# Patient Record
Sex: Female | Born: 1955 | Hispanic: No | Marital: Married | Smoking: Never smoker
Health system: Southern US, Community
[De-identification: ages and names within clinical notes are randomized; demographics above are authoritative.]

## PROBLEM LIST (undated history)

## (undated) DIAGNOSIS — I10 Essential (primary) hypertension: Secondary | ICD-10-CM

## (undated) DIAGNOSIS — Z809 Family history of malignant neoplasm, unspecified: Secondary | ICD-10-CM

## (undated) DIAGNOSIS — Z923 Personal history of irradiation: Secondary | ICD-10-CM

## (undated) DIAGNOSIS — E119 Type 2 diabetes mellitus without complications: Secondary | ICD-10-CM

## (undated) DIAGNOSIS — C801 Malignant (primary) neoplasm, unspecified: Secondary | ICD-10-CM

## (undated) DIAGNOSIS — E785 Hyperlipidemia, unspecified: Secondary | ICD-10-CM

## (undated) HISTORY — PX: NO PAST SURGERIES: SHX2092

## (undated) HISTORY — DX: Family history of malignant neoplasm, unspecified: Z80.9

## (undated) HISTORY — PX: TUBAL LIGATION: SHX77

## (undated) HISTORY — DX: Hyperlipidemia, unspecified: E78.5

## (undated) HISTORY — DX: Essential (primary) hypertension: I10

## (undated) HISTORY — PX: BREAST LUMPECTOMY: SHX2

## (undated) HISTORY — DX: Type 2 diabetes mellitus without complications: E11.9

---

## 2015-05-13 ENCOUNTER — Ambulatory Visit: Payer: Self-pay

## 2015-06-04 ENCOUNTER — Ambulatory Visit: Payer: Self-pay

## 2015-07-18 ENCOUNTER — Ambulatory Visit: Payer: Medicaid Other | Attending: Family Medicine

## 2015-10-29 ENCOUNTER — Telehealth: Payer: Self-pay | Admitting: Internal Medicine

## 2015-10-29 ENCOUNTER — Ambulatory Visit: Payer: Self-pay | Attending: Internal Medicine | Admitting: Internal Medicine

## 2015-10-29 ENCOUNTER — Telehealth: Payer: Self-pay

## 2015-10-29 VITALS — BP 114/77 | HR 76 | Temp 98.0°F | Resp 16 | Wt 139.6 lb

## 2015-10-29 DIAGNOSIS — E559 Vitamin D deficiency, unspecified: Secondary | ICD-10-CM | POA: Insufficient documentation

## 2015-10-29 DIAGNOSIS — Z131 Encounter for screening for diabetes mellitus: Secondary | ICD-10-CM

## 2015-10-29 DIAGNOSIS — E785 Hyperlipidemia, unspecified: Secondary | ICD-10-CM | POA: Insufficient documentation

## 2015-10-29 DIAGNOSIS — K029 Dental caries, unspecified: Secondary | ICD-10-CM

## 2015-10-29 DIAGNOSIS — Z1239 Encounter for other screening for malignant neoplasm of breast: Secondary | ICD-10-CM

## 2015-10-29 DIAGNOSIS — Z1329 Encounter for screening for other suspected endocrine disorder: Secondary | ICD-10-CM

## 2015-10-29 DIAGNOSIS — Z23 Encounter for immunization: Secondary | ICD-10-CM

## 2015-10-29 DIAGNOSIS — R634 Abnormal weight loss: Secondary | ICD-10-CM | POA: Insufficient documentation

## 2015-10-29 DIAGNOSIS — H6123 Impacted cerumen, bilateral: Secondary | ICD-10-CM

## 2015-10-29 DIAGNOSIS — Z1211 Encounter for screening for malignant neoplasm of colon: Secondary | ICD-10-CM

## 2015-10-29 DIAGNOSIS — I10 Essential (primary) hypertension: Secondary | ICD-10-CM | POA: Insufficient documentation

## 2015-10-29 LAB — CBC WITH DIFFERENTIAL/PLATELET
BASOS PCT: 0 %
Basophils Absolute: 0 cells/uL (ref 0–200)
Eosinophils Absolute: 220 cells/uL (ref 15–500)
Eosinophils Relative: 4 %
HEMATOCRIT: 38.1 % (ref 35.0–45.0)
HEMOGLOBIN: 12.9 g/dL (ref 11.7–15.5)
LYMPHS ABS: 2255 {cells}/uL (ref 850–3900)
Lymphocytes Relative: 41 %
MCH: 28.4 pg (ref 27.0–33.0)
MCHC: 33.9 g/dL (ref 32.0–36.0)
MCV: 83.9 fL (ref 80.0–100.0)
MONO ABS: 330 {cells}/uL (ref 200–950)
MPV: 10.7 fL (ref 7.5–12.5)
Monocytes Relative: 6 %
Neutro Abs: 2695 cells/uL (ref 1500–7800)
Neutrophils Relative %: 49 %
Platelets: 237 10*3/uL (ref 140–400)
RBC: 4.54 MIL/uL (ref 3.80–5.10)
RDW: 13.2 % (ref 11.0–15.0)
WBC: 5.5 10*3/uL (ref 3.8–10.8)

## 2015-10-29 LAB — POCT GLYCOSYLATED HEMOGLOBIN (HGB A1C): HEMOGLOBIN A1C: 6

## 2015-10-29 LAB — GLUCOSE, POCT (MANUAL RESULT ENTRY): POC GLUCOSE: 104 mg/dL — AB (ref 70–99)

## 2015-10-29 LAB — TSH: TSH: 1.83 mIU/L

## 2015-10-29 MED ORDER — METFORMIN HCL 500 MG PO TABS: 500.0000 mg | ORAL_TABLET | Freq: Two times a day (BID) | ORAL | 3 refills | Status: DC

## 2015-10-29 MED ORDER — SIMVASTATIN 20 MG PO TABS: 20.0000 mg | ORAL_TABLET | Freq: Every day | ORAL | 1 refills | Status: DC

## 2015-10-29 MED ORDER — LISINOPRIL-HYDROCHLOROTHIAZIDE 20-12.5 MG PO TABS: 1.0000 | ORAL_TABLET | Freq: Every day | ORAL | 1 refills | Status: DC

## 2015-10-29 NOTE — Telephone Encounter (Signed)
Pt. Forgot to tell her PCP that she needs a referral for the dentist. Pt. Needs a cleaning. Please f/u

## 2015-10-29 NOTE — Telephone Encounter (Signed)
Done. May take 1-2 weeks to get appt at earliest.  Please tell her to keep appt, bc if miss, these dentist will not see again, since they are working with our financial assistance program.

## 2015-10-29 NOTE — Telephone Encounter (Signed)
Patient daughter is aware of the referral placed for the dentist and doesn't have any questions or concerns

## 2015-10-29 NOTE — Telephone Encounter (Signed)
Will forward to pcp

## 2015-10-29 NOTE — Telephone Encounter (Signed)
Talked to dgt (who was at clinic today) for predm, metformin 500 qday started

## 2015-10-29 NOTE — Progress Notes (Signed)
Emily Hendricks, is a 60 y.o. female  [id number]  [id number]  DOB - 1955-10-06  CC:  Chief Complaint  Patient presents with  . Establish Care       HPI: Emily Hendricks is a 60 y.o. female Hindi here today to establish medical care, new to our clinic.  Last saw pcp in [place] about 1 year ago.  Pt has significant pmhx of HTN, hld, and vit d def. She use to have knee pains, but not bad. C/o of wax in ears., uses qtips and other equipment at times to take it out. Denies hearing loss.  Last mm was at least 4-5 years ago, never had colonoscopy.  Does not smoke or drink.   Patient has No headache, No chest pain, No abdominal pain - No Nausea, No new weakness tingling or numbness, No Cough - SOB.  Pt is here w/ her adult dgt, who is helping [place] interpret.  Review of Systems: Per HPI, o/w all systems reviewed and negative.    Not on File No past medical history on file. No current outpatient prescriptions on file prior to visit.   No current facility-administered medications on file prior to visit.    No family history on file. Social History   Social History  . Marital status: Married    Spouse name: N/A  . Number of children: N/A  . Years of education: N/A   Occupational History  . Not on file.   Social History Main Topics  . Smoking status: Not on file  . Smokeless tobacco: Not on file  . Alcohol use Not on file  . Drug use: Unknown  . Sexual activity: Not on file   Other Topics Concern  . Not on file   Social History Narrative  . No narrative on file    Objective:   Vitals:   10/29/15 1140  BP: 114/77  Pulse: 76  Resp: 16  Temp: 98 F (36.7 C)    Filed Weights   10/29/15 1140  Weight: 139 lb 9.6 oz (63.3 kg)    BP Readings from Last 3 Encounters:  10/29/15 114/77    Physical Exam: Constitutional: Patient appears well-developed and well-nourished. No distress. AAOx3, pleasant. HENT: Normocephalic, atraumatic, External right and left  ear normal. Oropharynx is clear and moist. bilat ear canals w/ ceruminosis impaction, dry/soft in some areas. Eyes: Conjunctivae and EOM are normal. PERRL, no scleral icterus. Neck: Normal ROM. Neck supple. No JVD.  CVS: RRR, S1/S2 +, no murmurs, no gallops, no carotid bruit.  Pulmonary: Effort and breath sounds normal, no stridor, rhonchi, wheezes, rales.  Abdominal: Soft. BS +, no distension, tenderness, rebound or guarding.  Musculoskeletal: Normal range of motion. No edema and no tenderness.  LE: bilat/ no c/c Neuro: muscle tone coordination wnl. No cranial nerve deficit grossly. Skin: Skin is warm and dry. No rash noted. Not diaphoretic. No erythema. No pallor.  Has 27mm mole on right shoulder, been there for years per pt. Psychiatric: Normal mood and affect. Behavior, judgment, thought content normal.  No results found for: WBC, HGB, HCT, MCV, PLT No results found for: CREATININE, BUN, NA, K, CL, CO2  No results found for: HGBA1C Lipid Panel  No results found for: CHOL, TRIG, HDL, CHOLHDL, VLDL, LDLCALC     Depression screen [facility] 2/9 10/29/2015  Decreased Interest 0  Down, Depressed, Hopeless 0  PHQ - 2 Score 0    Assessment and plan:   1. HTN (hypertension), benign Low salt diet discussed,  -  CBC with Differential/Platelet - BASIC METABOLIC PANEL WITH GFR - continue prinzide 20-12.5 for now, may be able to reduce next time if remains nml bp.  2. HLD (hyperlipidemia) Ate some rice wafers this am, - Lipid Panel - continue simvastatin20  for now, pending labs  3. Ceruminosis, bilateral - Ear Lavage - per CMA, mod amt obtained. - encouraged pt to not use qtips/utensils to remove ear wax, may cause TM damage, infections, etc.  4. Colon cancer screening - Ambulatory referral to Gastroenterology, per pt has orange card  5. Vitamin D deficiency - VITAMIN D 25 Hydroxy (Vit-D Deficiency, Fractures)  6. Diabetes mellitus screening - POCT glycosylated hemoglobin (Hb A1C) -  6.0 - recd metformin 500 qday. Called dgt abt finding, she will pass on message to her mom - recd low carb diet, increase exericse - trial metformin 500 qd for weight loss and glucose control., d/w dgt s/e of gi, if too many gi s/e, can switch to long acting.  7. Thyroid disorder screen - TSH  8. Health maintenance Mm ordered as well, but may be pending insurance status.  Return in about 4 weeks (around 11/26/2015) for pap/htn.  The patient was given clear instructions to go to ER or return to medical center if symptoms don't improve, worsen or new problems develop. The patient verbalized understanding. The patient was told to call to get lab results if they haven't heard anything in the next week.    This note has been created with Surveyor, quantity. Any transcriptional errors are unintentional.   Maren Reamer, MD, [place], [place]   10/29/2015, 12:00 PM

## 2015-10-29 NOTE — Patient Instructions (Addendum)
*financial aid packet, for [employer] discount? thx    Cholesterol:  To address this please limit saturated fat to no more than 7% of your calories, limit cholesterol to 200 mg/day, increase fiber and exercise as tolerated. If needed we may add another cholesterol lowering medication to your regimen.   Low-Sodium Eating Plan Sodium raises blood pressure and causes water to be held in the body. Getting less sodium from food will help lower your blood pressure, reduce any swelling, and protect your heart, liver, and kidneys. We get sodium by adding salt (sodium chloride) to food. Most of our sodium comes from canned, boxed, and frozen foods. Restaurant foods, fast foods, and pizza are also very high in sodium. Even if you take medicine to lower your blood pressure or to reduce fluid in your body, getting less sodium from your food is important. WHAT IS MY PLAN? Most people should limit their sodium intake to 2,300 mg a day. Your health care provider recommends that you limit your sodium intake to 2 gram a day.  WHAT DO I NEED TO KNOW ABOUT THIS EATING PLAN? For the low-sodium eating plan, you will follow these general guidelines:  Choose foods with a % Daily Value for sodium of less than 5% (as listed on the food label).   Use salt-free seasonings or herbs instead of table salt or sea salt.   Check with your health care provider or pharmacist before using salt substitutes.   Eat fresh foods.  Eat more vegetables and fruits.  Limit canned vegetables. If you do use them, rinse them well to decrease the sodium.   Limit cheese to 1 oz (28 g) per day.   Eat lower-sodium products, often labeled as "lower sodium" or "no salt added."  Avoid foods that contain monosodium glutamate (MSG). MSG is sometimes added to [place] food and some canned foods.  Check food labels (Nutrition Facts labels) on foods to learn how much sodium is in one serving.  Eat more home-cooked food and less restaurant,  buffet, and fast food.  When eating at a restaurant, ask that your food be prepared with less salt, or no salt if possible.  HOW DO I READ FOOD LABELS FOR SODIUM INFORMATION? The Nutrition Facts label lists the amount of sodium in one serving of the food. If you eat more than one serving, you must multiply the listed amount of sodium by the number of servings. Food labels may also identify foods as:  Sodium free--Less than 5 mg in a serving.  Very low sodium--35 mg or less in a serving.  Low sodium--140 mg or less in a serving.  Light in sodium--50% less sodium in a serving. For example, if a food that usually has 300 mg of sodium is changed to become light in sodium, it will have 150 mg of sodium.  Reduced sodium--25% less sodium in a serving. For example, if a food that usually has 400 mg of sodium is changed to reduced sodium, it will have 300 mg of sodium. WHAT FOODS CAN I EAT? Grains Low-sodium cereals, including oats, puffed wheat and rice, and shredded wheat cereals. Low-sodium crackers. Unsalted rice and pasta. Lower-sodium bread.  Vegetables Frozen or fresh vegetables. Low-sodium or reduced-sodium canned vegetables. Low-sodium or reduced-sodium tomato sauce and paste. Low-sodium or reduced-sodium tomato and vegetable juices.  Fruits Fresh, frozen, and canned fruit. Fruit juice.  Meat and Other Protein Products Low-sodium canned tuna and salmon. Fresh or frozen meat, poultry, seafood, and fish. Lamb. Unsalted nuts.  Dried beans, peas, and lentils without added salt. Unsalted canned beans. Homemade soups without salt. Eggs.  Dairy Milk. Soy milk. Ricotta cheese. Low-sodium or reduced-sodium cheeses. Yogurt.  Condiments Fresh and dried herbs and spices. Salt-free seasonings. Onion and garlic powders. Low-sodium varieties of mustard and ketchup. Fresh or refrigerated horseradish. Lemon juice.  Fats and Oils Reduced-sodium salad dressings. Unsalted butter.   Other Unsalted popcorn and pretzels.  The items listed above may not be a complete list of recommended foods or beverages. Contact your dietitian for more options. WHAT FOODS ARE NOT RECOMMENDED? Grains Instant hot cereals. Bread stuffing, pancake, and biscuit mixes. Croutons. Seasoned rice or pasta mixes. Noodle soup cups. Boxed or frozen macaroni and cheese. Self-rising flour. Regular salted crackers. Vegetables Regular canned vegetables. Regular canned tomato sauce and paste. Regular tomato and vegetable juices. Frozen vegetables in sauces. Salted [place] fries. Olives. Angie Fava. Relishes. Sauerkraut. Salsa. Meat and Other Protein Products Salted, canned, smoked, spiced, or pickled meats, seafood, or fish. Bacon, ham, sausage, hot dogs, corned beef, chipped beef, and packaged luncheon meats. Salt pork. Jerky. Pickled herring. Anchovies, regular canned tuna, and sardines. Salted nuts. Dairy Processed cheese and cheese spreads. Cheese curds. Blue cheese and cottage cheese. Buttermilk.  Condiments Onion and garlic salt, seasoned salt, table salt, and sea salt. Canned and packaged gravies. Worcestershire sauce. Tartar sauce. Barbecue sauce. Teriyaki sauce. Soy sauce, including reduced sodium. Steak sauce. Fish sauce. Oyster sauce. Cocktail sauce. Horseradish that you find on the shelf. Regular ketchup and mustard. Meat flavorings and tenderizers. Bouillon cubes. Hot sauce. Tabasco sauce. Marinades. Taco seasonings. Relishes. Fats and Oils Regular salad dressings. Salted butter. Margarine. Ghee. Bacon fat.  Other Potato and tortilla chips. Corn chips and puffs. Salted popcorn and pretzels. Canned or dried soups. Pizza. Frozen entrees and pot pies.  The items listed above may not be a complete list of foods and beverages to avoid. Contact your dietitian for more information.   This information is not intended to replace advice given to you by your health care provider. Make sure you  discuss any questions you have with your health care provider.   Document Released: 08/28/2001 Document Revised: 03/29/2014 Document Reviewed: 01/10/2013 Elsevier Interactive Patient Education 2016 [place] is a normal process in which your reproductive ability comes to an end. This process happens gradually over a span of months to years, usually between the ages of 57 and 62. Menopause is complete when you have missed 12 consecutive menstrual periods. It is important to talk with your health care provider about some of the most common conditions that affect postmenopausal women, such as heart disease, cancer, and bone loss (osteoporosis). Adopting a healthy lifestyle and getting preventive care can help to promote your health and wellness. Those actions can also lower your chances of developing some of these common conditions. WHAT SHOULD I KNOW ABOUT MENOPAUSE? During menopause, you may experience a number of symptoms, such as:  Moderate-to-severe hot flashes.  Night sweats.  Decrease in sex drive.  Mood swings.  Headaches.  Tiredness.  Irritability.  Memory problems.  Insomnia. Choosing to treat or not to treat menopausal changes is an individual decision that you make with your health care provider. WHAT SHOULD I KNOW ABOUT HORMONE REPLACEMENT THERAPY AND SUPPLEMENTS? Hormone therapy products are effective for treating symptoms that are associated with menopause, such as hot flashes and night sweats. Hormone replacement carries certain risks, especially as you become older. If you are thinking about using estrogen or estrogen  with progestin treatments, discuss the benefits and risks with your health care provider. WHAT SHOULD I KNOW ABOUT HEART DISEASE AND STROKE? Heart disease, heart attack, and stroke become more likely as you age. This may be due, in part, to the hormonal changes that your body experiences during menopause. These can affect how your body  processes dietary fats, triglycerides, and cholesterol. Heart attack and stroke are both medical emergencies. There are many things that you can do to help prevent heart disease and stroke:  Have your blood pressure checked at least every 1-2 years. High blood pressure causes heart disease and increases the risk of stroke.  If you are 24-54 years old, ask your health care provider if you should take aspirin to prevent a heart attack or a stroke.  Do not use any tobacco products, including cigarettes, chewing tobacco, or electronic cigarettes. If you need help quitting, ask your health care provider.  It is important to eat a healthy diet and maintain a healthy weight.  Be sure to include plenty of vegetables, fruits, low-fat dairy products, and lean protein.  Avoid eating foods that are high in solid fats, added sugars, or salt (sodium).  Get regular exercise. This is one of the most important things that you can do for your health.  Try to exercise for at least 150 minutes each week. The type of exercise that you do should increase your heart rate and make you sweat. This is known as moderate-intensity exercise.  Try to do strengthening exercises at least twice each week. Do these in addition to the moderate-intensity exercise.  Know your numbers.Ask your health care provider to check your cholesterol and your blood glucose. Continue to have your blood tested as directed by your health care provider. WHAT SHOULD I KNOW ABOUT CANCER SCREENING? There are several types of cancer. Take the following steps to reduce your risk and to catch any cancer development as early as possible. Breast Cancer  Practice breast self-awareness.  This means understanding how your breasts normally appear and feel.  It also means doing regular breast self-exams. Let your health care provider know about any changes, no matter how small.  If you are 58 or older, have a clinician do a breast exam (clinical  breast exam or CBE) every year. Depending on your age, family history, and medical history, it may be recommended that you also have a yearly breast X-ray (mammogram).  If you have a family history of breast cancer, talk with your health care provider about genetic screening.  If you are at high risk for breast cancer, talk with your health care provider about having an MRI and a mammogram every year.  Breast cancer (BRCA) gene test is recommended for women who have family members with BRCA-related cancers. Results of the assessment will determine the need for genetic counseling and BRCA1 and for BRCA2 testing. BRCA-related cancers include these types:  Breast. This occurs in males or females.  Ovarian.  Tubal. This may also be called fallopian tube cancer.  Cancer of the abdominal or pelvic lining (peritoneal cancer).  Prostate.  Pancreatic. Cervical, Uterine, and Ovarian Cancer Your health care provider may recommend that you be screened regularly for cancer of the pelvic organs. These include your ovaries, uterus, and vagina. This screening involves a pelvic exam, which includes checking for microscopic changes to the surface of your cervix (Pap test).  For women ages 21-65, health care providers may recommend a pelvic exam and a Pap test every three  years. For women ages 24-65, they may recommend the Pap test and pelvic exam, combined with testing for human papilloma virus (HPV), every five years. Some types of HPV increase your risk of cervical cancer. Testing for HPV may also be done on women of any age who have unclear Pap test results.  Other health care providers may not recommend any screening for nonpregnant women who are considered low risk for pelvic cancer and have no symptoms. Ask your health care provider if a screening pelvic exam is right for you.  If you have had past treatment for cervical cancer or a condition that could lead to cancer, you need Pap tests and screening  for cancer for at least 20 years after your treatment. If Pap tests have been discontinued for you, your risk factors (such as having a new sexual partner) need to be reassessed to determine if you should start having screenings again. Some women have medical problems that increase the chance of getting cervical cancer. In these cases, your health care provider may recommend that you have screening and Pap tests more often.  If you have a family history of uterine cancer or ovarian cancer, talk with your health care provider about genetic screening.  If you have vaginal bleeding after reaching menopause, tell your health care provider.  There are currently no reliable tests available to screen for ovarian cancer. Lung Cancer Lung cancer screening is recommended for adults 23-30 years old who are at high risk for lung cancer because of a history of smoking. A yearly low-dose CT scan of the lungs is recommended if you:  Currently smoke.  Have a history of at least 30 pack-years of smoking and you currently smoke or have quit within the past 15 years. A pack-year is smoking an average of one pack of cigarettes per day for one year. Yearly screening should:  Continue until it has been 15 years since you quit.  Stop if you develop a health problem that would prevent you from having lung cancer treatment. Colorectal Cancer  This type of cancer can be detected and can often be prevented.  Routine colorectal cancer screening usually begins at age 73 and continues through age 14.  If you have risk factors for colon cancer, your health care provider may recommend that you be screened at an earlier age.  If you have a family history of colorectal cancer, talk with your health care provider about genetic screening.  Your health care provider may also recommend using home test kits to check for hidden blood in your stool.  A small camera at the end of a tube can be used to examine your colon  directly (sigmoidoscopy or colonoscopy). This is done to check for the earliest forms of colorectal cancer.  Direct examination of the colon should be repeated every 5-10 years until age 58. However, if early forms of precancerous polyps or small growths are found or if you have a family history or genetic risk for colorectal cancer, you may need to be screened more often. Skin Cancer  Check your skin from head to toe regularly.  Monitor any moles. Be sure to tell your health care provider:  About any new moles or changes in moles, especially if there is a change in a mole's shape or color.  If you have a mole that is larger than the size of a pencil eraser.  If any of your family members has a history of skin cancer, especially at a young  age, talk with your health care provider about genetic screening.  Always use sunscreen. Apply sunscreen liberally and repeatedly throughout the day.  Whenever you are outside, protect yourself by wearing long sleeves, pants, a wide-brimmed hat, and sunglasses. WHAT SHOULD I KNOW ABOUT OSTEOPOROSIS? Osteoporosis is a condition in which bone destruction happens more quickly than new bone creation. After menopause, you may be at an increased risk for osteoporosis. To help prevent osteoporosis or the bone fractures that can happen because of osteoporosis, the following is recommended:  If you are 77-85 years old, get at least 1,000 mg of calcium and at least 600 mg of vitamin D per day.  If you are older than age 29 but younger than age 40, get at least 1,200 mg of calcium and at least 600 mg of vitamin D per day.  If you are older than age 77, get at least 1,200 mg of calcium and at least 800 mg of vitamin D per day. Smoking and excessive alcohol intake increase the risk of osteoporosis. Eat foods that are rich in calcium and vitamin D, and do weight-bearing exercises several times each week as directed by your health care provider. WHAT SHOULD I KNOW  ABOUT HOW MENOPAUSE AFFECTS [place]? Depression may occur at any age, but it is more common as you become older. Common symptoms of depression include:  Low or sad mood.  Changes in sleep patterns.  Changes in appetite or eating patterns.  Feeling an overall lack of motivation or enjoyment of activities that you previously enjoyed.  Frequent crying spells. Talk with your health care provider if you think that you are experiencing depression. WHAT SHOULD I KNOW ABOUT IMMUNIZATIONS? It is important that you get and maintain your immunizations. These include:  Tetanus, diphtheria, and pertussis (Tdap) booster vaccine.  Influenza every year before the flu season begins.  Pneumonia vaccine.  Shingles vaccine. Your health care provider may also recommend other immunizations.   This information is not intended to replace advice given to you by your health care provider. Make sure you discuss any questions you have with your health care provider.   Document Released: 04/30/2005 Document Revised: 03/29/2014 Document Reviewed: 11/08/2013 Elsevier Interactive Patient Education 2016 [employer].    Tdap Vaccine (Tetanus, Diphtheria and Pertussis): What You Need to Know 1. Why get vaccinated? Tetanus, diphtheria and pertussis are very serious diseases. Tdap vaccine can protect [place] from these diseases. And, Tdap vaccine given to pregnant women can protect newborn babies against pertussis. TETANUS (Lockjaw) is rare in the [place] today. It causes painful muscle tightening and stiffness, usually all over the body.  It can lead to tightening of muscles in the head and neck so you can't open your mouth, swallow, or sometimes even breathe. Tetanus kills about 1 out of 10 people who are infected even after receiving the best medical care. DIPHTHERIA is also rare in the [place] today. It can cause a thick coating to form in the back of the throat.  It can lead to breathing  problems, heart failure, paralysis, and death. PERTUSSIS (Whooping Cough) causes severe coughing spells, which can cause difficulty breathing, vomiting and disturbed sleep.  It can also lead to weight loss, incontinence, and rib fractures. Up to 2 in 100 adolescents and 5 in 100 adults with pertussis are hospitalized or have complications, which could include pneumonia or death. These diseases are caused by bacteria. Diphtheria and pertussis are spread from person to person through secretions from  coughing or sneezing. Tetanus enters the body through cuts, scratches, or wounds. Before vaccines, as many as 200,000 cases of diphtheria, 200,000 cases of pertussis, and hundreds of cases of tetanus, were reported in the [place] each year. Since vaccination began, reports of cases for tetanus and diphtheria have dropped by about 99% and for pertussis by about 80%. 2. Tdap vaccine Tdap vaccine can protect adolescents and adults from tetanus, diphtheria, and pertussis. One dose of Tdap is routinely given at age 46 or 42. People who did not get Tdap at that age should get it as soon as possible. Tdap is especially important for healthcare professionals and anyone having close contact with a baby younger than 12 months. Pregnant women should get a dose of Tdap during every pregnancy, to protect the newborn from pertussis. Infants are most at risk for severe, life-threatening complications from pertussis. Another vaccine, called Td, protects against tetanus and diphtheria, but not pertussis. A Td booster should be given every 10 years. Tdap may be given as one of these boosters if you have never gotten Tdap before. Tdap may also be given after a severe cut or burn to prevent tetanus infection. Your doctor or the person giving you the vaccine can give you more information. Tdap may safely be given at the same time as other vaccines. 3. Some people should not get this vaccine  A person who has ever had a  life-threatening allergic reaction after a previous dose of any diphtheria, tetanus or pertussis containing vaccine, OR has a severe allergy to any part of this vaccine, should not get Tdap vaccine. Tell the person giving the vaccine about any severe allergies.  Anyone who had coma or long repeated seizures within 7 days after a childhood dose of DTP or DTaP, or a previous dose of Tdap, should not get Tdap, unless a cause other than the vaccine was found. They can still get Td.  Talk to your doctor if you:  have seizures or another nervous system problem,  had severe pain or swelling after any vaccine containing diphtheria, tetanus or pertussis,  ever had a condition called Guillain-Barr Syndrome (GBS),  aren't feeling well on the day the shot is scheduled. 4. Risks With any medicine, including vaccines, there is a chance of side effects. These are usually mild and go away on their own. Serious reactions are also possible but are rare. Most people who get Tdap vaccine do not have any problems with it. Mild problems following Tdap (Did not interfere with activities)  Pain where the shot was given (about 3 in 4 adolescents or 2 in 3 adults)  Redness or swelling where the shot was given (about 1 person in 5)  Mild fever of at least 100.70F (up to about 1 in 25 adolescents or 1 in 100 adults)  Headache (about 3 or 4 people in 10)  Tiredness (about 1 person in 3 or 4)  Nausea, vomiting, diarrhea, stomach ache (up to 1 in 4 adolescents or 1 in 10 adults)  Chills, sore joints (about 1 person in 10)  Body aches (about 1 person in 3 or 4)  Rash, swollen glands (uncommon) Moderate problems following Tdap (Interfered with activities, but did not require medical attention)  Pain where the shot was given (up to 1 in 5 or 6)  Redness or swelling where the shot was given (up to about 1 in 16 adolescents or 1 in 12 adults)  Fever over 102F (about 1 in 100 adolescents or  1 in 250  adults)  Headache (about 1 in 7 adolescents or 1 in 10 adults)  Nausea, vomiting, diarrhea, stomach ache (up to 1 or 3 people in 100)  Swelling of the entire arm where the shot was given (up to about 1 in 500). Severe problems following Tdap (Unable to perform usual activities; required medical attention)  Swelling, severe pain, bleeding and redness in the arm where the shot was given (rare). Problems that could happen after any vaccine:  People sometimes faint after a medical procedure, including vaccination. Sitting or lying down for about 15 minutes can help prevent fainting, and injuries caused by a fall. Tell your doctor if you feel dizzy, or have vision changes or ringing in the ears.  Some people get severe pain in the shoulder and have difficulty moving the arm where a shot was given. This happens very rarely.  Any medication can cause a severe allergic reaction. Such reactions from a vaccine are very rare, estimated at fewer than 1 in a million doses, and would happen within a few minutes to a few hours after the vaccination. As with any medicine, there is a very remote chance of a vaccine causing a serious injury or death. The safety of vaccines is always being monitored. For more information, visit: http://www.aguilar.org/ 5. What if there is a serious problem? What should I look for?  Look for anything that concerns you, such as signs of a severe allergic reaction, very high fever, or unusual behavior.  Signs of a severe allergic reaction can include hives, swelling of the face and throat, difficulty breathing, a fast heartbeat, dizziness, and weakness. These would usually start a few minutes to a few hours after the vaccination. What should I do?  If you think it is a severe allergic reaction or other emergency that can't wait, call 9-1-1 or get the person to the nearest hospital. Otherwise, call your doctor.  Afterward, the reaction should be reported to the Vaccine  Adverse Event Reporting System (VAERS). Your doctor might file this report, or you can do it yourself through the VAERS web site at www.vaers.SamedayNews.es, or by calling 463-200-5956. VAERS does not give medical advice.  6. The National Vaccine Injury Compensation Program The [employer] Vaccine Injury Compensation Program (VICP) is a federal program that was created to compensate people who may have been injured by certain vaccines. Persons who believe they may have been injured by a vaccine can learn about the program and about filing a claim by calling (562) 610-5504 or visiting the [place] website at GoldCloset.com.ee. There is a time limit to file a claim for compensation. 7. How can I learn more?  Ask your doctor. He or she can give you the vaccine package insert or suggest other sources of information.  Call your local or state health department.  Contact the Centers for Disease Control and Prevention (CDC):  Call 219 322 3254 (1-800-CDC-INFO) or  Visit CDC's website at [email or web address]/ CDC Tdap Vaccine VIS (05/15/13)   This information is not intended to replace advice given to you by your health care provider. Make sure you discuss any questions you have with your health care provider.   Document Released: 09/07/2011 Document Revised: 03/29/2014 Document Reviewed: 06/20/2013 Elsevier Interactive Patient Education [employer].

## 2015-10-29 NOTE — Telephone Encounter (Signed)
Spoke wit h pt daughter and made aware about the dentist referral and it will take anywhere between 1-2 weeks

## 2015-10-30 LAB — LIPID PANEL
CHOL/HDL RATIO: 3.2 ratio (ref <=5.0)
Cholesterol: 161 mg/dL (ref 125–200)
HDL: 51 mg/dL (ref >=46)
LDL CALC: 81 mg/dL (ref <130)
Triglycerides: 144 mg/dL (ref <150)
VLDL: 29 mg/dL (ref <30)

## 2015-10-30 LAB — BASIC METABOLIC PANEL WITH GFR
BUN: 18 mg/dL (ref 7–25)
CO2: 28 mmol/L (ref 20–31)
Calcium: 9.5 mg/dL (ref 8.6–10.4)
Chloride: 102 mmol/L (ref 98–110)
Creat: 0.79 mg/dL (ref 0.50–0.99)
GFR, EST NON AFRICAN AMERICAN: 81 mL/min (ref >=60)
GLUCOSE: 100 mg/dL — AB (ref 65–99)
POTASSIUM: 4.2 mmol/L (ref 3.5–5.3)
Sodium: 140 mmol/L (ref 135–146)

## 2015-10-30 LAB — VITAMIN D 25 HYDROXY (VIT D DEFICIENCY, FRACTURES): Vit D, 25-Hydroxy: 25 ng/mL — ABNORMAL LOW (ref 30–100)

## 2015-10-31 ENCOUNTER — Encounter: Payer: Self-pay | Admitting: Gastroenterology

## 2015-11-05 ENCOUNTER — Telehealth: Payer: Self-pay

## 2015-11-05 NOTE — Telephone Encounter (Signed)
[facility] [id number] contacted pt to go over lab results pt did not answer lvm for pt to give me a call back at her earliest convenience

## 2015-11-12 ENCOUNTER — Telehealth: Payer: Self-pay

## 2015-11-12 NOTE — Telephone Encounter (Signed)
Contacted pt to go over lab results spoke with [name] which is the daughter she is aware of results and is aware to add Vit D 1000 iu daily. Emily Hendricks states she doesn't have any questions or concerns

## 2016-01-02 ENCOUNTER — Encounter: Payer: Self-pay | Admitting: Gastroenterology

## 2016-01-28 ENCOUNTER — Other Ambulatory Visit: Payer: Self-pay | Admitting: Internal Medicine

## 2016-02-03 ENCOUNTER — Ambulatory Visit (HOSPITAL_COMMUNITY)
Admission: RE | Admit: 2016-02-03 | Discharge: 2016-02-03 | Disposition: A | Discharge status: Home / Self Care | Payer: Self-pay | Source: Ambulatory Visit | Attending: Internal Medicine | Admitting: Internal Medicine

## 2016-02-03 ENCOUNTER — Ambulatory Visit: Payer: Self-pay | Attending: Internal Medicine | Admitting: Internal Medicine

## 2016-02-03 VITALS — BP 113/78 | HR 85 | Temp 97.3°F | Resp 16 | Wt 141.4 lb

## 2016-02-03 DIAGNOSIS — R7303 Prediabetes: Secondary | ICD-10-CM | POA: Insufficient documentation

## 2016-02-03 DIAGNOSIS — R059 Cough, unspecified: Secondary | ICD-10-CM

## 2016-02-03 DIAGNOSIS — Z79899 Other long term (current) drug therapy: Secondary | ICD-10-CM | POA: Insufficient documentation

## 2016-02-03 DIAGNOSIS — R05 Cough: Secondary | ICD-10-CM | POA: Insufficient documentation

## 2016-02-03 DIAGNOSIS — M778 Other enthesopathies, not elsewhere classified: Secondary | ICD-10-CM

## 2016-02-03 DIAGNOSIS — I700 Atherosclerosis of aorta: Secondary | ICD-10-CM | POA: Insufficient documentation

## 2016-02-03 DIAGNOSIS — M654 Radial styloid tenosynovitis [de Quervain]: Secondary | ICD-10-CM | POA: Insufficient documentation

## 2016-02-03 DIAGNOSIS — Z7984 Long term (current) use of oral hypoglycemic drugs: Secondary | ICD-10-CM | POA: Insufficient documentation

## 2016-02-03 DIAGNOSIS — I10 Essential (primary) hypertension: Secondary | ICD-10-CM | POA: Insufficient documentation

## 2016-02-03 LAB — GLUCOSE, POCT (MANUAL RESULT ENTRY): POC Glucose: 125 mg/dl — AB (ref 70–99)

## 2016-02-03 MED ORDER — BENZONATATE 100 MG PO CAPS: 100.0000 mg | ORAL_CAPSULE | Freq: Three times a day (TID) | ORAL | 0 refills | Status: DC | PRN

## 2016-02-03 MED ORDER — METFORMIN HCL 500 MG PO TABS: 500.0000 mg | ORAL_TABLET | Freq: Every day | ORAL | 3 refills | Status: DC

## 2016-02-03 MED ORDER — PREDNISONE 20 MG PO TABS: 40.0000 mg | ORAL_TABLET | Freq: Every day | ORAL | 0 refills | Status: DC

## 2016-02-03 MED ORDER — DICLOFENAC SODIUM 1 % TD GEL: 2.0000 g | Freq: Four times a day (QID) | TRANSDERMAL | 2 refills | Status: DC

## 2016-02-03 NOTE — Progress Notes (Signed)
Emily Hendricks, is a 60 y.o. female  [id number]  [id number]  DOB - Jul 14, 1955  Chief Complaint  Patient presents with  . Arm Pain        Subjective:   Emily Hendricks is a 60 y.o. female here today for a follow up visit. Pt has been co of right sided pain over her right thumb for 1-2 wks. Comes and goes, but now does not seem to go away.  Also co of bilat knee pains occasionally when sits for long periods and than gets up.  Of note, pt has also been c/o nonproductive dry cough last 2- 3wks, only at night when sleeping. She has been on the lisinopril for  A log time. Sleeps w/ 1 pillow, denies pnd/f/c/productive cough.  tol metformin, but only takes it once a day currently.  Patient has No headache, No chest pain, No abdominal pain - No Nausea, No new weakness tingling or numbness, SOB.   She is here w/ her dgt who is interpreting.  No problems updated.  ALLERGIES: Not on File  PAST MEDICAL HISTORY: No past medical history on file.  MEDICATIONS AT HOME: Prior to Admission medications   Medication Sig Start Date End Date Taking? Authorizing Provider  benzonatate (TESSALON PERLES) 100 MG capsule Take 1 capsule (100 mg total) by mouth 3 (three) times daily as needed for cough. 02/03/16   Maren Reamer, MD  diclofenac sodium (VOLTAREN) 1 % GEL Apply 2 g topically 4 (four) times daily. 02/03/16   Maren Reamer, MD  lisinopril-hydrochlorothiazide (PRINZIDE,ZESTORETIC) 20-12.5 MG tablet Take 1 tablet by mouth daily. 10/29/15   Maren Reamer, MD  metFORMIN (GLUCOPHAGE) 500 MG tablet Take 1 tablet (500 mg total) by mouth 2 (two) times daily with a meal. 10/29/15   Maren Reamer, MD  predniSONE (DELTASONE) 20 MG tablet Take 2 tablets (40 mg total) by mouth daily with breakfast. Day 1- 3 take 2 tabs (=40mg ) qam, day 4-7 take 1 tab (=20mg  ) qam, than stop. 02/03/16   Maren Reamer, MD  simvastatin (ZOCOR) 20 MG tablet TAKE 1 TABLET BY MOUTH DAILY. 01/28/16   Maren Reamer, MD     Objective:   Vitals:   02/03/16 1554  BP: 113/78  Pulse: 85  Resp: 16  Temp: 97.3 F (36.3 C)  TempSrc: Oral  SpO2: 97%  Weight: 141 lb 6.4 oz (64.1 kg)    Exam General appearance : Awake, alert, not in any distress. Speech Clear. Not toxic looking, pleasant. HEENT: Atraumatic and Normocephalic, pupils equally reactive to light. Neck: supple, no JVD.  Chest:Good air entry bilaterally, no added sounds.  No w/c/r CVS: S1 S2 regular, no murmurs/gallups or rubs. Abdomen: Bowel sounds active, Non tender and not distended with no gaurding, rigidity or rebound. Extremities: B/L Lower Ext shows no edema, both legs are warm to touch Right hand + [place]ck[place]in test w/ ttp.  Neg Phalen test.  ttp at base of right thumb. Neurology: Awake alert, and oriented X 3, CN II-XII grossly intact, Non focal Skin:No Rash  Data Review Lab Results  Component Value Date   HGBA1C 6.0 10/29/2015    Depression screen [facility] 2/9 02/03/2016 10/29/2015  Decreased Interest 0 0  Hendricks, Depressed, Hopeless 0 0  PHQ - 2 Score 0 0      Assessment & Plan   1. Tenosynovitis, de Quervain, right - otc wrist splint recd for right hand - short course steroids - prn voltaren gel  2.  Cough Etiology unclear, cardiac? Does not appear to be othopneac.  Has been on ACEI for long time as well. - DG Chest 2 View; Future - Brain natriuretic peptide, ro chf., appears euvolemic. - CBC with Differential - BASIC METABOLIC PANEL WITH GFR - tessalon pearls prn.  4. Prediabetes - metformin 500 qd ok, continue low carb diet. - POCT glucose (manual entry)  5. HTN (hypertension), benign Controlled. May need to switch off ACEI if cough is due to this.     Patient have been counseled extensively about nutrition and exercise  Return in about 3 months (around 05/05/2016), or if symptoms worsen or fail to improve.  The patient was given clear instructions to go to ER or return to medical center if  symptoms don't improve, worsen or new problems develop. The patient verbalized understanding. The patient was told to call to get lab results if they haven't heard anything in the next week.   This note has been created with Surveyor, quantity. Any transcriptional errors are unintentional.   Maren Reamer, MD, [place] [facility] [place], [place]   02/03/2016, 4:09 PM

## 2016-02-03 NOTE — Progress Notes (Signed)
Pt is in the office today for arm pain Pt states her pain is coming from her right thumb pain Pt stated the pain comes and goes Pt states when she goes to sleep at night she has a cough but during the day she doesn't Pt states when sits for long period of time her left knee starts to hurt

## 2016-02-03 NOTE — Patient Instructions (Addendum)
Calcium 1200mg  day and Vit D 5,000 IUs   -  Wrist splint. - for right hand / for Tendonitis, over the counter in pharmacy area.  -would recommend make appointment again for colonoscopy as well! Good colon cancer screening test.  -please get Mammogram done!  Breast cancer screening test.   Alfonse Ras Disease Introduction Alfonse Ras disease is inflammation of the tendon on the thumb side of the wrist. Tendons are cords of tissue that connect bones to muscles. The tendons in your hand pass through a tunnel, or sheath. A slippery layer of tissue (synovium) lets the tendons move smoothly in the sheath. With de Quervain disease, the sheath swells or thickens, causing friction and pain. The condition is also called de Quervain tendinosis and de Quervain syndrome. It occurs most often in women who are 15-44 years old. What are the causes? The exact cause of de Quervain disease is not known. It may result from:  Overusing your hands, especially with repetitive motions that involve twisting your hand or using a forceful grip.  Pregnancy.  Rheumatoid disease. What increases the risk? You may have a greater risk for de Quervain disease if you:  Are a middle-aged woman.  Are pregnant.  Have rheumatoid arthritis.  Have diabetes.  Use your hands far more than normal, especially with a tight grip or excessive twisting. What are the signs or symptoms? Pain on the thumb side of your wrist is the main symptom of de Quervain disease. Other signs and symptoms include:  Pain that gets worse when you grasp something or turn your wrist.  Pain that extends up the forearm.  Cysts in the area of the pain.  Swelling of your wrist and hand.  A sensation of snapping in the wrist.  Trouble moving the thumb and wrist. How is this diagnosed? Your health care provider may diagnose de Quervain disease based on your signs and symptoms. A physical exam will also be done. A simple test Wynn Maudlin  test) that involves pulling your thumb and wrist to see if this causes pain can help determine whether you have the condition. Sometimes you may need to have an X-ray. How is this treated? Avoiding any activity that causes pain and swelling is the best treatment. Other options include:  Wearing a splint.  Taking medicine. Anti-inflammatory medicines and corticosteroid injections may reduce inflammation and relieve pain.  Having surgery if other treatments do not work. Follow these instructions at home:  Using ice can be helpful after doing activities that involve the sore wrist. To apply ice to the injured area:  Put ice in a plastic bag.  Place a towel between your skin and the bag.  Leave the ice on for 20 minutes, 2-3 times a day.  Take medicines only as directed by your health care provider.  Wear your splint as directed. This will allow your hand to rest and heal. Contact a health care provider if:  Your pain medicine does not help.  Your pain gets worse.  You develop new symptoms. This information is not intended to replace advice given to you by your health care provider. Make sure you discuss any questions you have with your health care provider. Document Released: 12/01/2000 Document Revised: 08/14/2015 Document Reviewed: 07/11/2013  2017 Elsevier  -  Cough, Adult Introduction A cough helps to clear your throat and lungs. A cough may last only 2-3 weeks (acute), or it may last longer than 8 weeks (chronic). Many different things can cause a cough.  A cough may be a sign of an illness or another medical condition. Follow these instructions at home:  Pay attention to any changes in your cough.  Take medicines only as told by your doctor.  If you were prescribed an antibiotic medicine, take it as told by your doctor. Do not stop taking it even if you start to feel better.  Talk with your doctor before you try using a cough medicine.  Drink enough fluid to keep  your pee (urine) clear or pale yellow.  If the air is dry, use a cold steam vaporizer or humidifier in your home.  Stay away from things that make you cough at work or at home.  If your cough is worse at night, try using extra pillows to raise your head up higher while you sleep.  Do not smoke, and try not to be around smoke. If you need help quitting, ask your doctor.  Do not have caffeine.  Do not drink alcohol.  Rest as needed. Contact a doctor if:  You have new problems (symptoms).  You cough up yellow fluid (pus).  Your cough does not get better after 2-3 weeks, or your cough gets worse.  Medicine does not help your cough and you are not sleeping well.  You have pain that gets worse or pain that is not helped with medicine.  You have a fever.  You are losing weight and you do not know why.  You have night sweats. Get help right away if:  You cough up blood.  You have trouble breathing.  Your heartbeat is very fast. This information is not intended to replace advice given to you by your health care provider. Make sure you discuss any questions you have with your health care provider. Document Released: 11/19/2010 Document Revised: 08/14/2015 Document Reviewed: 05/15/2014  2017 Elsevier   - Diabetes Mellitus and Food It is important for you to manage your blood sugar (glucose) level. Your blood glucose level can be greatly affected by what you eat. Eating healthier foods in the appropriate amounts throughout the day at about the same time each day will help you control your blood glucose level. It can also help slow or prevent worsening of your diabetes mellitus. Healthy eating may even help you improve the level of your blood pressure and reach or maintain a healthy weight. General recommendations for healthful eating and cooking habits include:  Eating meals and snacks regularly. Avoid going long periods of time without eating to lose weight.  Eating a diet  that consists mainly of plant-based foods, such as fruits, vegetables, nuts, legumes, and whole grains.  Using low-heat cooking methods, such as baking, instead of high-heat cooking methods, such as deep frying. Work with your dietitian to make sure you understand how to use the Nutrition Facts information on food labels. How can food affect me? Carbohydrates  Carbohydrates affect your blood glucose level more than any other type of food. Your dietitian will help you determine how many carbohydrates to eat at each meal and teach you how to count carbohydrates. Counting carbohydrates is important to keep your blood glucose at a healthy level, especially if you are using insulin or taking certain medicines for diabetes mellitus. Alcohol  Alcohol can cause sudden decreases in blood glucose (hypoglycemia), especially if you use insulin or take certain medicines for diabetes mellitus. Hypoglycemia can be a life-threatening condition. Symptoms of hypoglycemia (sleepiness, dizziness, and disorientation) are similar to symptoms of having too much alcohol. If your  health care provider has given you approval to drink alcohol, do so in moderation and use the following guidelines:  Women should not have more than one drink per day, and men should not have more than two drinks per day. One drink is equal to:  12 oz of beer.  5 oz of wine.  1 oz of hard liquor.  Do not drink on an empty stomach.  Keep yourself hydrated. Have water, diet soda, or unsweetened iced tea.  Regular soda, juice, and other mixers might contain a lot of carbohydrates and should be counted. What foods are not recommended? As you make food choices, it is important to remember that all foods are not the same. Some foods have fewer nutrients per serving than other foods, even though they might have the same number of calories or carbohydrates. It is difficult to get your body what it needs when you eat foods with fewer nutrients.  Examples of foods that you should avoid that are high in calories and carbohydrates but low in nutrients include:  Trans fats (most processed foods list trans fats on the Nutrition Facts label).  Regular soda.  Juice.  Candy.  Sweets, such as cake, pie, doughnuts, and cookies.  Fried foods. What foods can I eat? Eat nutrient-rich foods, which will nourish your body and keep you healthy. The food you should eat also will depend on several factors, including:  The calories you need.  The medicines you take.  Your weight.  Your blood glucose level.  Your blood pressure level.  Your cholesterol level. You should eat a variety of foods, including:  Protein.  Lean cuts of meat.  Proteins low in saturated fats, such as fish, egg whites, and beans. Avoid processed meats.  Fruits and vegetables.  Fruits and vegetables that may help control blood glucose levels, such as apples, mangoes, and yams.  Dairy products.  Choose fat-free or low-fat dairy products, such as milk, yogurt, and cheese.  Grains, bread, pasta, and rice.  Choose whole grain products, such as multigrain bread, whole oats, and brown rice. These foods may help control blood pressure.  Fats.  Foods containing healthful fats, such as nuts, avocado, olive oil, canola oil, and fish. Does everyone with diabetes mellitus have the same meal plan? Because every person with diabetes mellitus is different, there is not one meal plan that works for everyone. It is very important that you meet with a dietitian who will help you create a meal plan that is just right for you. This information is not intended to replace advice given to you by your health care provider. Make sure you discuss any questions you have with your health care provider. Document Released: 12/03/2004 Document Revised: 08/14/2015 Document Reviewed: 02/02/2013 Elsevier Interactive Patient Education  2017 [employer].

## 2016-02-04 ENCOUNTER — Other Ambulatory Visit: Payer: Self-pay | Admitting: Internal Medicine

## 2016-02-04 ENCOUNTER — Telehealth: Payer: Self-pay

## 2016-02-04 LAB — BASIC METABOLIC PANEL WITH GFR
BUN: 13 mg/dL (ref 7–25)
CALCIUM: 9.5 mg/dL (ref 8.6–10.4)
CHLORIDE: 103 mmol/L (ref 98–110)
CO2: 31 mmol/L (ref 20–31)
CREATININE: 0.78 mg/dL (ref 0.50–0.99)
GFR, Est African American: >89 mL/min (ref >=60)
GFR, Est Non African American: 82 mL/min (ref >=60)
Glucose, Bld: 100 mg/dL — ABNORMAL HIGH (ref 65–99)
Potassium: 4.3 mmol/L (ref 3.5–5.3)
SODIUM: 141 mmol/L (ref 135–146)

## 2016-02-04 LAB — CBC WITH DIFFERENTIAL/PLATELET
BASOS ABS: 0 {cells}/uL (ref 0–200)
Basophils Relative: 0 %
EOS ABS: 108 {cells}/uL (ref 15–500)
EOS PCT: 2 %
HCT: 38.5 % (ref 35.0–45.0)
HEMOGLOBIN: 12.5 g/dL (ref 11.7–15.5)
Lymphocytes Relative: 39 %
Lymphs Abs: 2106 cells/uL (ref 850–3900)
MCH: 27.9 pg (ref 27.0–33.0)
MCHC: 32.5 g/dL (ref 32.0–36.0)
MCV: 85.9 fL (ref 80.0–100.0)
MONOS PCT: 7 %
MPV: 10.7 fL (ref 7.5–12.5)
Monocytes Absolute: 378 cells/uL (ref 200–950)
NEUTROS PCT: 52 %
Neutro Abs: 2808 cells/uL (ref 1500–7800)
PLATELETS: 227 10*3/uL (ref 140–400)
RBC: 4.48 MIL/uL (ref 3.80–5.10)
RDW: 12.8 % (ref 11.0–15.0)
WBC: 5.4 10*3/uL (ref 3.8–10.8)

## 2016-02-04 LAB — BRAIN NATRIURETIC PEPTIDE: BRAIN NATRIURETIC PEPTIDE: 30.9 pg/mL (ref <100)

## 2016-02-04 MED ORDER — VALSARTAN 40 MG PO TABS: 40.0000 mg | ORAL_TABLET | Freq: Every day | ORAL | 2 refills | Status: DC

## 2016-02-04 NOTE — Telephone Encounter (Signed)
Pt was called and a VM was left informing pt to return phone call for lab results. If pt calls back route message to [place].

## 2016-02-26 ENCOUNTER — Other Ambulatory Visit: Payer: Self-pay

## 2016-02-26 DIAGNOSIS — Z23 Encounter for immunization: Secondary | ICD-10-CM

## 2016-03-18 ENCOUNTER — Ambulatory Visit: Payer: Self-pay | Attending: Internal Medicine

## 2016-03-18 ENCOUNTER — Other Ambulatory Visit: Payer: Self-pay | Admitting: Internal Medicine

## 2016-03-22 HISTORY — PX: COLONOSCOPY: SHX174

## 2016-03-22 HISTORY — PX: POLYPECTOMY: SHX149

## 2016-04-07 ENCOUNTER — Ambulatory Visit: Payer: Self-pay

## 2016-04-16 ENCOUNTER — Ambulatory Visit: Payer: Self-pay | Attending: Internal Medicine

## 2016-06-29 ENCOUNTER — Other Ambulatory Visit: Payer: Self-pay | Admitting: Internal Medicine

## 2016-07-29 ENCOUNTER — Telehealth: Payer: Self-pay | Admitting: Internal Medicine

## 2016-07-29 NOTE — Telephone Encounter (Signed)
Patient called the office to request medication refill for all her medications. Please send them to our pharmacy.  Thank you.

## 2016-07-29 NOTE — Telephone Encounter (Signed)
Patient has not been seen since 01/2016, will forward request to PCP

## 2016-08-02 ENCOUNTER — Other Ambulatory Visit: Payer: Self-pay | Admitting: Internal Medicine

## 2016-08-05 ENCOUNTER — Encounter: Payer: Self-pay | Admitting: Internal Medicine

## 2016-08-06 ENCOUNTER — Encounter: Payer: Self-pay | Admitting: Internal Medicine

## 2016-08-09 ENCOUNTER — Encounter: Payer: Self-pay | Admitting: Internal Medicine

## 2016-10-13 ENCOUNTER — Telehealth: Payer: Self-pay | Admitting: General Practice

## 2016-10-13 MED ORDER — SIMVASTATIN 20 MG PO TABS: 20.0000 mg | ORAL_TABLET | Freq: Every day | ORAL | 0 refills | Status: DC

## 2016-10-13 MED ORDER — METFORMIN HCL 500 MG PO TABS: ORAL_TABLET | ORAL | 0 refills | Status: DC

## 2016-10-13 MED ORDER — LOSARTAN POTASSIUM 25 MG PO TABS
25.0000 mg | ORAL_TABLET | Freq: Every day | ORAL | 0 refills | Status: DC
Start: 2016-10-13 — End: 2016-10-20

## 2016-10-13 NOTE — Telephone Encounter (Signed)
Pt. Called requesting a refill on all her current medication. Pt. Would like Rx sent to [facility] on [facility]. Pt. Has scheduled an appt. On 10/20/16. Please f/u

## 2016-10-13 NOTE — Telephone Encounter (Signed)
Refilled requested medications. Of note, changed valsartan to equivalent dose of losartan due to recall of valsartan. Patient to follow up next week.

## 2016-10-20 ENCOUNTER — Encounter: Payer: Self-pay | Admitting: Family Medicine

## 2016-10-20 ENCOUNTER — Ambulatory Visit: Payer: Self-pay | Attending: Family Medicine | Admitting: Family Medicine

## 2016-10-20 VITALS — BP 116/74 | HR 74 | Resp 16 | Wt 132.8 lb

## 2016-10-20 DIAGNOSIS — M65341 Trigger finger, right ring finger: Secondary | ICD-10-CM

## 2016-10-20 DIAGNOSIS — G8929 Other chronic pain: Secondary | ICD-10-CM

## 2016-10-20 DIAGNOSIS — Z0000 Encounter for general adult medical examination without abnormal findings: Secondary | ICD-10-CM

## 2016-10-20 DIAGNOSIS — M79641 Pain in right hand: Secondary | ICD-10-CM | POA: Insufficient documentation

## 2016-10-20 DIAGNOSIS — I10 Essential (primary) hypertension: Secondary | ICD-10-CM

## 2016-10-20 DIAGNOSIS — B070 Plantar wart: Secondary | ICD-10-CM

## 2016-10-20 DIAGNOSIS — R7303 Prediabetes: Secondary | ICD-10-CM

## 2016-10-20 LAB — GLUCOSE, POCT (MANUAL RESULT ENTRY): POC GLUCOSE: 113 mg/dL — AB (ref 70–99)

## 2016-10-20 LAB — POCT GLYCOSYLATED HEMOGLOBIN (HGB A1C): HEMOGLOBIN A1C: 5.7

## 2016-10-20 MED ORDER — METFORMIN HCL 500 MG PO TABS
ORAL_TABLET | ORAL | 2 refills | Status: DC
Start: 2016-10-20 — End: 2017-02-19

## 2016-10-20 MED ORDER — DICLOFENAC SODIUM 50 MG PO TBEC: DELAYED_RELEASE_TABLET | ORAL | 1 refills | Status: DC

## 2016-10-20 MED ORDER — LOSARTAN POTASSIUM 25 MG PO TABS: ORAL_TABLET | ORAL | 2 refills | Status: DC

## 2016-10-20 NOTE — Progress Notes (Signed)
Subjective:  Patient ID: Emily Hendricks, female    DOB: Aug 14, 1955  Age: 61 y.o. MRN: [id number]  CC: Hypertension   HPI Rosell Khouri presents for complains of arthralgias for which has been present for 1 year. Pain is located in right hand, is described as aching, and is intermittent . Pain 5/10. Pain with worse with activities like housework. Associated symptoms include: decreased range of motion and deformity.  The patient has OTC analgesics for pain relief and wrist brace.  Related to injury: no. History of hypertension. She is not exercising and is not adherent to low salt diet.  She does not check BP at home. Cardiac symptoms none. Patient denies chest pain, chest pressure/discomfort, claudication, near-syncope, palpitations and syncope.  Cardiovascular risk factors: advanced age (older than 47 for men, 62 for women), hypertension, sedentary lifestyle and prediabetes. Use of agents associated with hypertension: none. History of target organ damage: none. History of prediabetes follow up.Symptoms: none.Patient denies foot ulcerations, hyperglycemia, nausea, paresthesia of the feet, polydipsia, polyuria, visual disturbances and vomitting.  Evaluation to date has been included: fasting blood sugar and hemoglobin A1C.  Home sugars: patient does not check sugars. Treatment to date: metformin. She reports taking half a tablet of  metformin twice a day for the last three 3 weeks. She also complaints of reoccurring lesion to right plantar foot surface. She is unsure of onset. Denies any swelling, drainage, or redness.    Outpatient Medications Prior to Visit  Medication Sig Dispense Refill  . simvastatin (ZOCOR) 20 MG tablet Take 1 tablet (20 mg total) by mouth daily. 30 tablet 0  . metFORMIN (GLUCOPHAGE) 500 MG tablet TAKE 1 TABLET BY MOUTH 2 TIMES DAILY WITH A MEAL. 60 tablet 0  . benzonatate (TESSALON PERLES) 100 MG capsule Take 1 capsule (100 mg total) by mouth 3 (three) times daily as needed for  cough. (Patient not taking: Reported on 10/20/2016) 20 capsule 0  . diclofenac sodium (VOLTAREN) 1 % GEL Apply 2 g topically 4 (four) times daily. (Patient not taking: Reported on 10/20/2016) 100 g 2  . losartan (COZAAR) 25 MG tablet Take 1 tablet (25 mg total) by mouth daily. (Patient not taking: Reported on 10/20/2016) 30 tablet 0  . predniSONE (DELTASONE) 20 MG tablet Take 2 tablets (40 mg total) by mouth daily with breakfast. Day 1- 3 take 2 tabs (=40mg ) qam, day 4-7 take 1 tab (=20mg  ) qam, than stop. (Patient not taking: Reported on 10/20/2016) 10 tablet 0   No facility-administered medications prior to visit.     ROS Review of Systems  Constitutional: Negative.   Eyes: Negative.   Respiratory: Negative.   Cardiovascular: Negative.   Gastrointestinal: Negative.   Musculoskeletal: Positive for arthralgias.  Skin:       Foot lesion   Psychiatric/Behavioral: Negative.    Objective:  BP 116/74 (BP Location: Left Arm, Patient Position: Sitting, Cuff Size: Normal)   Pulse 74   Resp 16   Wt 132 lb 12.8 oz (60.2 kg)   SpO2 98%   BP/Weight 10/20/2016 36/64/4034 09/22/2593  Systolic BP 638 756 433  Diastolic BP 74 78 77  Wt. (Lbs) 132.8 141.4 139.6   Physical Exam  Constitutional: She appears well-developed and well-nourished.  Eyes: Pupils are equal, round, and reactive to light. Conjunctivae are normal.  Neck: No JVD present.  Cardiovascular: Normal rate, regular rhythm, normal heart sounds and intact distal pulses.   Pulmonary/Chest: Effort normal and breath sounds normal.  Abdominal: Soft. Bowel sounds are  normal.  Musculoskeletal:       Right hand: She exhibits decreased range of motion (third digit of right hand). Decreased strength (4/5 motor strength) noted.  Skin: Skin is warm and dry. Lesion (plantar wart ) noted.  Nursing note and vitals reviewed.   Assessment & Plan:   Problem List Items Addressed This Visit    None    Visit Diagnoses    Prediabetes    -  Primary    Relevant Medications   metFORMIN (GLUCOPHAGE) 500 MG tablet   Other Relevant Orders   HgB A1c (Completed)   Glucose (CBG) (Completed)   Plantar wart of right foot       Relevant Orders   Ambulatory referral to Podiatry   Chronic hand pain, right       Relevant Medications   diclofenac (VOLTAREN) 50 MG EC tablet   Other Relevant Orders   DG Hand Complete Right   Uric Acid (Completed)   Sedimentation Rate (Completed)   Trigger ring finger of right hand       Relevant Medications   diclofenac (VOLTAREN) 50 MG EC tablet   Health care maintenance       Relevant Orders   MM SCREENING BREAST TOMO BILATERAL   Ambulatory referral to Gastroenterology   Essential hypertension       Schedule BP recheck in 2 weeks with nurse.   If BP is greater than 90/60 (MAP 65 or greater) but not less than 130/80 may increase dose    of losartan to 25 mg QD and recheck in another 2 weeks.    Follow up in 3 months with PCP.   Relevant Medications   losartan (COZAAR) 25 MG tablet   Other Relevant Orders   Lipid Panel      Meds ordered this encounter  Medications  . metFORMIN (GLUCOPHAGE) 500 MG tablet    Sig: TAKE 1 TABLET BY MOUTH 2 TIMES DAILY WITH A MEAL.    Dispense:  60 tablet    Refill:  2    Order Specific Question:   Supervising Provider    Answer:   Tresa Garter [id number]  . losartan (COZAAR) 25 MG tablet    Sig: TAKE HALF A TABLET BY MOUTH DAILY.    Dispense:  30 tablet    Refill:  2    Order Specific Question:   Supervising Provider    Answer:   Tresa Garter [id number]  . diclofenac (VOLTAREN) 50 MG EC tablet    Sig: TAKE ONE TABLET TWICE A DAY BY MOUTH  AS NEEDED WITH MEAL.    Dispense:  30 tablet    Refill:  1    Order Specific Question:   Supervising Provider    Answer:   Tresa Garter [id number]    Follow-up: Return in about 2 weeks (around 11/03/2016) for BP check with [place].   Alfonse Spruce FNP

## 2016-10-20 NOTE — Progress Notes (Signed)
Pt needs medication RF Right hand pain Callous on right foot

## 2016-10-20 NOTE — Patient Instructions (Signed)
Future cholesterol screening lab patient must be fasting at least 4 hours prior.   Hand Pain Many things can cause hand pain. Some common causes are:  An injury.  Repeating the same movement with your hand over and over (overuse).  Osteoporosis.  Arthritis.  Lumps in the tendons or joints of the hand and wrist (ganglion cysts).  Infection.  Follow these instructions at home: Pay attention to any changes in your symptoms. Take these actions to help with your discomfort:  If directed, put ice on the affected area: ? Put ice in a plastic bag. ? Place a towel between your skin and the bag. ? Leave the ice on for 15-20 minutes, 3?4 times a day for 2 days.  Take over-the-counter and prescription medicines only as told by your health care provider.  Minimize stress on your hands and wrists as much as possible.  Take breaks from repetitive activity often.  Do stretches as told by your health care provider.  Do not do activities that make your pain worse.  Contact a health care provider if:  Your pain does not get better after a few days of self-care.  Your pain gets worse.  Your pain affects your ability to do your daily activities. Get help right away if:  Your hand becomes warm, red, or swollen.  Your hand is numb or tingling.  Your hand is extremely swollen or deformed.  Your hand or fingers turn white or blue.  You cannot move your hand, wrist, or fingers. This information is not intended to replace advice given to you by your health care provider. Make sure you discuss any questions you have with your health care provider. Document Released: 04/04/2015 Document Revised: 08/14/2015 Document Reviewed: 04/03/2014 Elsevier Interactive Patient Education  [employer].

## 2016-10-21 LAB — SEDIMENTATION RATE: SED RATE: 9 mm/h (ref 0–40)

## 2016-10-21 LAB — URIC ACID: Uric Acid: 3.8 mg/dL (ref 2.5–7.1)

## 2016-10-26 ENCOUNTER — Encounter: Payer: Self-pay | Admitting: Gastroenterology

## 2016-10-27 ENCOUNTER — Ambulatory Visit: Payer: Self-pay | Attending: Family Medicine

## 2016-10-27 DIAGNOSIS — I10 Essential (primary) hypertension: Secondary | ICD-10-CM

## 2016-10-28 LAB — LIPID PANEL
CHOLESTEROL TOTAL: 147 mg/dL (ref 100–199)
Chol/HDL Ratio: 2.9 ratio (ref 0.0–4.4)
HDL: 50 mg/dL (ref >39)
LDL Calculated: 79 mg/dL (ref 0–99)
TRIGLYCERIDES: 88 mg/dL (ref 0–149)
VLDL Cholesterol Cal: 18 mg/dL (ref 5–40)

## 2016-11-01 ENCOUNTER — Telehealth: Payer: Self-pay

## 2016-11-01 NOTE — Telephone Encounter (Signed)
CMA call regarding lab results  Patient daughter verify DOB   Patient daughter was aware and understood

## 2016-11-01 NOTE — Telephone Encounter (Signed)
-----  Message from Alfonse Spruce, [place] sent at 10/28/2016  5:53 PM EDT ----- -Uric acid test is normal. When uric acid is high it can cause gout.  -ESR test which evaluates for active tissue inflammation is negative. -It is possible symptoms are due to osteoarthritis. Follow up if symptoms worsen or fail to improve.

## 2016-11-05 ENCOUNTER — Other Ambulatory Visit: Payer: Self-pay | Admitting: Family Medicine

## 2016-11-05 ENCOUNTER — Ambulatory Visit: Payer: Self-pay | Attending: Family Medicine

## 2016-11-05 MED ORDER — SIMVASTATIN 20 MG PO TABS: 20.0000 mg | ORAL_TABLET | Freq: Every day | ORAL | 3 refills | Status: DC

## 2016-11-15 ENCOUNTER — Telehealth: Payer: Self-pay | Admitting: Family Medicine

## 2016-11-15 NOTE — Telephone Encounter (Signed)
Pt. Daughter called requesting for a referral for pt. To have a mammogram done. Please f/u

## 2016-11-16 NOTE — Telephone Encounter (Signed)
Pt. Daughter called requesting for a referral for pt. To have a mammogram done. Please advice?

## 2016-11-19 ENCOUNTER — Other Ambulatory Visit: Payer: Self-pay | Admitting: Family Medicine

## 2016-11-19 DIAGNOSIS — Z1239 Encounter for other screening for malignant neoplasm of breast: Secondary | ICD-10-CM

## 2016-11-19 NOTE — Telephone Encounter (Signed)
Referral will be placed. If patient is uninsured she will need to apply for the MM scholarship.

## 2016-11-19 NOTE — Telephone Encounter (Signed)
CMA call regarding MM referral already put in   Arnold spoke with patient daughter   Patient daughter was aware and understood

## 2016-11-19 NOTE — Telephone Encounter (Signed)
Referral will be placed. If patient is uninsured she will need to apply for the MM scholarship. If she is having issues with lumps, denting, or dimpling of the breast, or nipple discharge she needs to schedule office visit for follow up.

## 2016-11-30 ENCOUNTER — Ambulatory Visit: Payer: Self-pay | Admitting: Sports Medicine

## 2016-12-13 ENCOUNTER — Telehealth: Payer: Self-pay | Admitting: *Deleted

## 2016-12-13 NOTE — Telephone Encounter (Signed)
Patient no show PV today. Interpreter here waiting on pt. Called patient, no answer, left message for her to call us back today before 5 pm to rescheduled the pv.

## 2016-12-17 ENCOUNTER — Ambulatory Visit: Payer: No Typology Code available for payment source | Admitting: Podiatry

## 2016-12-17 ENCOUNTER — Encounter: Payer: Self-pay | Admitting: Podiatry

## 2016-12-17 VITALS — BP 160/93 | HR 77 | Resp 16

## 2016-12-17 DIAGNOSIS — Q828 Other specified congenital malformations of skin: Secondary | ICD-10-CM

## 2016-12-17 NOTE — Progress Notes (Signed)
  Subjective:  Patient ID: Emily Hendricks, female    DOB: 1956/01/10,  MRN: [id number] HPI Chief Complaint  Patient presents with  . Foot Pain    Plantar foot right - callused lesion x 6-7 months, sore when walking, no treatment    61 y.o. female presents with the above complaint. Reports callused area to the bottom of her right foot for the past 6-7 months denies prior treatment. Patient is of [place] origin and presents with interpreter today No past medical history on file. No past surgical history on file.  Current Outpatient Prescriptions:  .  losartan (COZAAR) 25 MG tablet, TAKE HALF A TABLET BY MOUTH DAILY., Disp: 30 tablet, Rfl: 2 .  metFORMIN (GLUCOPHAGE) 500 MG tablet, TAKE 1 TABLET BY MOUTH 2 TIMES DAILY WITH A MEAL., Disp: 60 tablet, Rfl: 2 .  simvastatin (ZOCOR) 20 MG tablet, Take 1 tablet (20 mg total) by mouth daily., Disp: 90 tablet, Rfl: 3  Allergies  Allergen Reactions  . Lisinopril Cough   Review of Systems  All other systems reviewed and are negative.  Objective:   Vitals:   12/17/16 0746  BP: (!) 160/93  Pulse: 77  Resp: 16   General AA&O x3. Normal mood and affect.  Vascular Dorsalis pedis and posterior tibial pulses 2/4 bilat. Brisk capillary refill to all digits. Pedal hair present.  Neurologic Epicritic sensation grossly intact.  Dermatologic Punctate hyperkeratosis plantar right lateral arch. Normal skin tension lines surrounding the lesion Nails well groomed and normal in appearance.  Orthopedic: MMT 5/5 in dorsiflexion, plantarflexion, inversion, and eversion. Normal joint ROM without pain or crepitus.   Radiographs: Taken and reviewed. No acute fractures or dislocations. No other osseous abnormalities. Assessment & Plan:  Patient was evaluated and treated and all questions answered.  Punctate keratosis -Educated on etiology -Lesion palliatively debrided today -Offloaded with corn pad  Procedure: Callus Debridement Rationale: painful  punctate keratotic lesion Type of Debridement: sharp Instrumentation: 15 blade Number of Calluses: 1   Return if symptoms worsen or fail to improve.

## 2016-12-20 ENCOUNTER — Ambulatory Visit (AMBULATORY_SURGERY_CENTER): Payer: Self-pay | Admitting: *Deleted

## 2016-12-20 ENCOUNTER — Other Ambulatory Visit: Payer: Self-pay

## 2016-12-20 VITALS — Ht 62.0 in | Wt 130.0 lb

## 2016-12-20 DIAGNOSIS — Z1211 Encounter for screening for malignant neoplasm of colon: Secondary | ICD-10-CM

## 2016-12-20 MED ORDER — NA SULFATE-K SULFATE-MG SULF 17.5-3.13-1.6 GM/177ML PO SOLN: ORAL | 0 refills | Status: DC

## 2016-12-20 NOTE — Progress Notes (Signed)
Interpreter Khah Riz,with patient at side during PV. Patient denies any allergies to eggs or soy. Patient denies any problems with anesthesia/sedation. Patient denies any oxygen use at home. Patient denies taking any diet/weight loss medications or blood thinners.

## 2016-12-27 ENCOUNTER — Ambulatory Visit (AMBULATORY_SURGERY_CENTER): Payer: Self-pay | Admitting: Gastroenterology

## 2016-12-27 ENCOUNTER — Encounter: Payer: Self-pay | Admitting: Gastroenterology

## 2016-12-27 VITALS — BP 141/76 | HR 72 | Temp 98.6°F | Resp 16 | Ht 62.0 in | Wt 130.0 lb

## 2016-12-27 DIAGNOSIS — Z1212 Encounter for screening for malignant neoplasm of rectum: Secondary | ICD-10-CM

## 2016-12-27 DIAGNOSIS — D123 Benign neoplasm of transverse colon: Secondary | ICD-10-CM

## 2016-12-27 DIAGNOSIS — D125 Benign neoplasm of sigmoid colon: Secondary | ICD-10-CM

## 2016-12-27 DIAGNOSIS — D122 Benign neoplasm of ascending colon: Secondary | ICD-10-CM

## 2016-12-27 DIAGNOSIS — Z800 Family history of malignant neoplasm of digestive organs: Secondary | ICD-10-CM

## 2016-12-27 DIAGNOSIS — Z1211 Encounter for screening for malignant neoplasm of colon: Secondary | ICD-10-CM

## 2016-12-27 MED ORDER — SODIUM CHLORIDE 0.9 % IV SOLN: 500.0000 mL | INTRAVENOUS | Status: DC

## 2016-12-27 NOTE — Progress Notes (Signed)
Pt's states no medical or surgical changes since previsit or office visit. 

## 2016-12-27 NOTE — Patient Instructions (Signed)
Discharge instructions given. Handout on polyps. Resume previous medications. YOU HAD AN ENDOSCOPIC PROCEDURE TODAY AT THE [facility]:   Refer to the procedure report that was given to you for any specific questions about what was found during the examination.  If the procedure report does not answer your questions, please call your gastroenterologist to clarify.  If you requested that your care partner not be given the details of your procedure findings, then the procedure report has been included in a sealed envelope for you to review at your convenience later.  YOU SHOULD EXPECT: Some feelings of bloating in the abdomen. Passage of more gas than usual.  Walking can help get rid of the air that was put into your GI tract during the procedure and reduce the bloating. If you had a lower endoscopy (such as a colonoscopy or flexible sigmoidoscopy) you may notice spotting of blood in your stool or on the toilet paper. If you underwent a bowel prep for your procedure, you may not have a normal bowel movement for a few days.  Please Note:  You might notice some irritation and congestion in your nose or some drainage.  This is from the oxygen used during your procedure.  There is no need for concern and it should clear up in a day or so.  SYMPTOMS TO REPORT IMMEDIATELY:   Following lower endoscopy (colonoscopy or flexible sigmoidoscopy):  Excessive amounts of blood in the stool  Significant tenderness or worsening of abdominal pains  Swelling of the abdomen that is new, acute  Fever of 100F or higher   For urgent or emergent issues, a gastroenterologist can be reached at any hour by calling [phone].   DIET:  We do recommend a small meal at first, but then you may proceed to your regular diet.  Drink plenty of fluids but you should avoid alcoholic beverages for 24 hours.  ACTIVITY:  You should plan to take it easy for the rest of today and you should NOT DRIVE or use heavy  machinery until tomorrow (because of the sedation medicines used during the test).    FOLLOW UP: Our staff will call the number listed on your records the next business day following your procedure to check on you and address any questions or concerns that you may have regarding the information given to you following your procedure. If we do not reach you, we will leave a message.  However, if you are feeling well and you are not experiencing any problems, there is no need to return our call.  We will assume that you have returned to your regular daily activities without incident.  If any biopsies were taken you will be contacted by phone or by letter within the next 1-3 weeks.  Please call us at [phone] if you have not heard about the biopsies in 3 weeks.    SIGNATURES/CONFIDENTIALITY: You and/or your care partner have signed paperwork which will be entered into your electronic medical record.  These signatures attest to the fact that that the information above on your After Visit Summary has been reviewed and is understood.  Full responsibility of the confidentiality of this discharge information lies with you and/or your care-partner. 

## 2016-12-27 NOTE — Op Note (Signed)
[place] [facility] Patient Name: Emily Hendricks Procedure Date: 12/27/2016 7:58 AM MRN: [id number] Endoscopist: [place]. Loletha Carrow , MD Age: 61 Referring MD:  Date of Birth: May 13, 1955 Gender: Female Account #: [id number] Procedure:                Colonoscopy Indications:              Colon cancer screening in patient at increased                            risk: Colorectal cancer in mother, This is the                            patient's first colonoscopy Medicines:                Monitored Anesthesia Care Procedure:                Pre-Anesthesia Assessment:                           - Prior to the procedure, a History and Physical                            was performed, and patient medications and                            allergies were reviewed. The patient's tolerance of                            previous anesthesia was also reviewed. The risks                            and benefits of the procedure and the sedation                            options and risks were discussed with the patient.                            All questions were answered, and informed consent                            was obtained. Prior Anticoagulants: The patient has                            taken no previous anticoagulant or antiplatelet                            agents. ASA Grade Assessment: II - A patient with                            mild systemic disease. After reviewing the risks                            and benefits, the patient was deemed in  satisfactory condition to undergo the procedure.                           After obtaining informed consent, the colonoscope                            was passed under direct vision. Throughout the                            procedure, the patient's blood pressure, pulse, and                            oxygen saturations were monitored continuously. The                            Colonoscope was introduced through the  anus and                            advanced to the the cecum, identified by                            appendiceal orifice and ileocecal valve. The                            colonoscopy was performed without difficulty. The                            patient tolerated the procedure well. The quality                            of the bowel preparation was fair. The ileocecal                            valve, appendiceal orifice, and rectum were                            photographed. The bowel preparation used was                            SUPREP. The quality of the bowel preparation was                            evaluated using the BBPS [facility] Bowel Preparation                            Scale) with scores of: Right Colon = 1, Transverse                            Colon = 1 and Left Colon = 1. The total BBPS score                            equals 3. Extensive lavage was performed. It is not  clear if the patient entirely understood prep                            instructions. Scope In: 8:07:36 AM Scope Out: 8:32:22 AM Scope Withdrawal Time: 0 hours 21 minutes 13 seconds  Total Procedure Duration: 0 hours 24 minutes 46 seconds  Findings:                 The perianal and digital rectal examinations were                            normal.                           Six sessile polyps were found in the sigmoid colon                            (1) , transverse colon (1) , hepatic flexure (1)                            and ascending colon (3). The polyps were 2 to 6 mm                            in size. These polyps were removed with a cold                            snare. Resection and retrieval were complete.                           The exam was otherwise without abnormality on                            direct and retroflexion views. Complications:            No immediate complications. Estimated Blood Loss:     Estimated blood loss was  minimal. Impression:               - Preparation of the colon was fair.                           - Six 2 to 6 mm polyps in the sigmoid colon, in the                            transverse colon, at the hepatic flexure and in the                            ascending colon, removed with a cold snare.                            Resected and retrieved.                           - The examination was otherwise normal on direct  and retroflexion views. Recommendation:           - Patient has a contact number available for                            emergencies. The signs and symptoms of potential                            delayed complications were discussed with the                            patient. Return to normal activities tomorrow.                            Written discharge instructions were provided to the                            patient.                           - Resume previous diet.                           - Continue present medications.                           - Await pathology results.                           - Repeat colonoscopy in 1 year for surveillance due                            to fair preparation. Griff Badley L. Loletha Carrow, MD 12/27/2016 8:40:04 AM This report has been signed electronically.

## 2016-12-27 NOTE — Progress Notes (Signed)
Called to room to assist during endoscopic procedure.  Patient ID and intended procedure confirmed with present staff. Received instructions for my participation in the procedure from the performing physician.  

## 2016-12-27 NOTE — Progress Notes (Signed)
Interpreter used today at the [facility] for this pt.  Interpreter's name is- Steward Ros.

## 2016-12-27 NOTE — Progress Notes (Signed)
Report given to PACU, vss 

## 2016-12-28 ENCOUNTER — Telehealth: Payer: Self-pay | Admitting: *Deleted

## 2016-12-28 NOTE — Telephone Encounter (Signed)
  Follow up Call-  Call back number 12/27/2016  Post procedure Call Back phone  # call daughter in law- [phone] first, daughter [phone]  Permission to leave phone message Yes     Patient questions:  Do you have a fever, pain , or abdominal swelling? No. Pain Score  0 *  Have you tolerated food without any problems? Yes.    Have you been able to return to your normal activities? Yes.    Do you have any questions about your discharge instructions: Diet   No. Medications  No. Follow up visit  No.  Do you have questions or concerns about your Care? No.  Actions: * If pain score is 4 or above: No action needed, pain <4.  Spoke with daughter this morning she stated that when she took her home yesterday she was doing fine,instructed daughter that if mother is having any problems to call back and let us know,she verbalize understanding.

## 2016-12-30 ENCOUNTER — Encounter: Payer: Self-pay | Admitting: Gastroenterology

## 2017-02-19 ENCOUNTER — Other Ambulatory Visit: Payer: Self-pay | Admitting: Internal Medicine

## 2017-02-19 ENCOUNTER — Other Ambulatory Visit: Payer: Self-pay | Admitting: Family Medicine

## 2017-02-19 DIAGNOSIS — R7303 Prediabetes: Secondary | ICD-10-CM

## 2017-03-01 ENCOUNTER — Other Ambulatory Visit: Payer: Self-pay | Admitting: Internal Medicine

## 2017-03-01 ENCOUNTER — Other Ambulatory Visit: Payer: Self-pay | Admitting: Family Medicine

## 2017-03-01 DIAGNOSIS — R7303 Prediabetes: Secondary | ICD-10-CM

## 2017-04-01 ENCOUNTER — Other Ambulatory Visit: Payer: Self-pay | Admitting: Family Medicine

## 2017-04-01 DIAGNOSIS — R7303 Prediabetes: Secondary | ICD-10-CM

## 2017-04-04 ENCOUNTER — Other Ambulatory Visit: Payer: Self-pay | Admitting: Family Medicine

## 2017-04-04 DIAGNOSIS — R7303 Prediabetes: Secondary | ICD-10-CM

## 2017-04-07 ENCOUNTER — Encounter: Payer: Self-pay | Admitting: Family Medicine

## 2017-04-07 ENCOUNTER — Ambulatory Visit: Payer: Self-pay | Attending: Family Medicine | Admitting: Family Medicine

## 2017-04-07 ENCOUNTER — Other Ambulatory Visit: Payer: Self-pay | Admitting: Family Medicine

## 2017-04-07 VITALS — BP 148/93 | HR 80 | Temp 98.5°F | Resp 18 | Ht 59.0 in | Wt 131.0 lb

## 2017-04-07 DIAGNOSIS — R7303 Prediabetes: Secondary | ICD-10-CM

## 2017-04-07 DIAGNOSIS — E785 Hyperlipidemia, unspecified: Secondary | ICD-10-CM | POA: Insufficient documentation

## 2017-04-07 DIAGNOSIS — Z79899 Other long term (current) drug therapy: Secondary | ICD-10-CM | POA: Insufficient documentation

## 2017-04-07 DIAGNOSIS — Z7984 Long term (current) use of oral hypoglycemic drugs: Secondary | ICD-10-CM | POA: Insufficient documentation

## 2017-04-07 DIAGNOSIS — I10 Essential (primary) hypertension: Secondary | ICD-10-CM | POA: Insufficient documentation

## 2017-04-07 DIAGNOSIS — B070 Plantar wart: Secondary | ICD-10-CM | POA: Insufficient documentation

## 2017-04-07 LAB — POCT GLYCOSYLATED HEMOGLOBIN (HGB A1C): Hemoglobin A1C: 5.7

## 2017-04-07 MED ORDER — METFORMIN HCL 500 MG PO TABS
ORAL_TABLET | ORAL | 3 refills | Status: DC
Start: 2017-04-07 — End: 2017-08-17

## 2017-04-07 MED ORDER — LOSARTAN POTASSIUM 25 MG PO TABS: 25.0000 mg | ORAL_TABLET | Freq: Every day | ORAL | 2 refills | Status: DC

## 2017-04-07 MED ORDER — SIMVASTATIN 20 MG PO TABS: 20.0000 mg | ORAL_TABLET | Freq: Every day | ORAL | 2 refills | Status: DC

## 2017-04-07 NOTE — Progress Notes (Signed)
Subjective:  Patient ID: Emily Hendricks, female    DOB: 03-Jul-1955  Age: 62 y.o. MRN: [id number]  CC: Medication Refill   HPI Lama Narayanan presents for follow up. Interpreter services used [phone] Ashish.  History of hypertension. She is not exercising and is low salt diet.  She does not check BP at home. She has been without her medication for a few days. Cardiac symptoms none. Patient denies chest pain, chest pressure/discomfort, claudication, near-syncope, palpitations and syncope.  Cardiovascular risk factors: advanced age (older than 36 for men, 47 for women), hypertension, sedentary lifestyle and prediabetes. Use of agents associated with hypertension: none. History of target organ damage: none.   History of prediabetes follow up.Symptoms: none.Patient denies foot ulcerations, hyperglycemia, nausea, paresthesia of the feet, polydipsia, polyuria, visual disturbances and vomitting.  Evaluation to date has been included: fasting blood sugar and hemoglobin A1C.  Home sugars: patient does not check sugars. Treatment to date: metformin. She reports taking half a tablet of  metformin twice a day for the last three 3 weeks. She also complaints of reoccurring lesion to right plantar foot surface. She is unsure of onset. Denies any swelling, drainage, or redness.     Outpatient Medications Prior to Visit  Medication Sig Dispense Refill  . Cholecalciferol (VITAMIN D PO) Take 1 tablet by mouth daily.    Marland Kitchen losartan (COZAAR) 25 MG tablet TAKE 1 TABLET BY MOUTH ONCE DAILY (Patient not taking: Reported on 04/07/2017) 30 tablet 0  . metFORMIN (GLUCOPHAGE) 500 MG tablet TAKE 1 TABLET BY MOUTH TWICE DAILY WITH MEALS (Patient not taking: Reported on 04/07/2017) 60 tablet 0  . simvastatin (ZOCOR) 20 MG tablet Take 1 tablet (20 mg total) by mouth daily. (Patient not taking: Reported on 04/07/2017) 90 tablet 3   No facility-administered medications prior to visit.     ROS Review of Systems  Constitutional:  Negative.   Eyes: Negative for visual disturbance.  Respiratory: Negative.   Cardiovascular: Negative.   Gastrointestinal: Negative.   Skin: Negative.   Neurological: Negative for numbness.  Psychiatric/Behavioral: Negative.         Objective:  BP (!) 148/93 (BP Location: Right Arm, Patient Position: Sitting, Cuff Size: Normal)   Pulse 80   Temp 98.5 F (36.9 C) (Oral)   Resp 18   Ht 4\' 11"  (1.499 m)   Wt 131 lb (59.4 kg)   SpO2 96%   BMI 26.46 kg/m   BP/Weight 04/07/2017 12/27/2016 35/06/5623  Systolic BP 638 937 -  Diastolic BP 93 76 -  Wt. (Lbs) 131 130 130  BMI 26.46 23.78 23.78     Physical Exam  Eyes: Conjunctivae are normal. Pupils are equal, round, and reactive to light.  Cardiovascular: Normal rate, regular rhythm, normal heart sounds and intact distal pulses.  Pulmonary/Chest: Effort normal and breath sounds normal.  Abdominal: Soft. Bowel sounds are normal.  Musculoskeletal:       Right hand: She exhibits no wrist extension trouble.       Left hand: She exhibits normal range of motion. She exhibits no wrist extension trouble.       Hands: Skin: Skin is warm and dry. Lesion (plantar wart to right foot) noted.  Nursing note and vitals reviewed.    Assessment & Plan:   1. Hypertension, unspecified type Schedule BP recheck in 2 weeks with nurse. If BP is greater than 90/60 (MAP 65 or greater) but not less than 130/80 may increase dose of losartan 50 mg QD and recheck in  another 2 weeks.   - losartan (COZAAR) 25 MG tablet; Take 1 tablet (25 mg total) by mouth daily.  Dispense: 30 tablet; Refill: 2  2. Dyslipidemia  - simvastatin (ZOCOR) 20 MG tablet; Take 1 tablet (20 mg total) by mouth daily.  Dispense: 30 tablet; Refill: 2  3. Pre-diabetes Dietary and exercise interventions discussed. - POCT glycosylated hemoglobin (Hb A1C) - metFORMIN (GLUCOPHAGE) 500 MG tablet; TAKE 1 TABLET BY MOUTH TWICE DAILY WITH MEALS  Dispense: 60 tablet; Refill: 3  4.  Plantar wart of right foot  - Ambulatory referral to Podiatry      Follow-up: Return in about 2 weeks (around 04/21/2017) for BP check with Travia.   Alfonse Spruce FNP

## 2017-04-15 ENCOUNTER — Ambulatory Visit: Payer: Self-pay

## 2017-05-02 ENCOUNTER — Encounter: Payer: Self-pay | Admitting: Family Medicine

## 2017-05-02 ENCOUNTER — Ambulatory Visit: Payer: Self-pay | Attending: Family Medicine | Admitting: Family Medicine

## 2017-05-02 VITALS — BP 150/96 | HR 73 | Temp 97.7°F | Resp 16 | Ht 59.0 in | Wt 132.0 lb

## 2017-05-02 DIAGNOSIS — Z87898 Personal history of other specified conditions: Secondary | ICD-10-CM

## 2017-05-02 DIAGNOSIS — I10 Essential (primary) hypertension: Secondary | ICD-10-CM

## 2017-05-02 DIAGNOSIS — Z79899 Other long term (current) drug therapy: Secondary | ICD-10-CM | POA: Insufficient documentation

## 2017-05-02 DIAGNOSIS — R519 Headache, unspecified: Secondary | ICD-10-CM

## 2017-05-02 DIAGNOSIS — R51 Headache: Secondary | ICD-10-CM | POA: Insufficient documentation

## 2017-05-02 DIAGNOSIS — R7303 Prediabetes: Secondary | ICD-10-CM | POA: Insufficient documentation

## 2017-05-02 DIAGNOSIS — Z7984 Long term (current) use of oral hypoglycemic drugs: Secondary | ICD-10-CM | POA: Insufficient documentation

## 2017-05-02 LAB — GLUCOSE, POCT (MANUAL RESULT ENTRY): POC GLUCOSE: 121 mg/dL — AB (ref 70–99)

## 2017-05-02 MED ORDER — LOSARTAN POTASSIUM 50 MG PO TABS: 50.0000 mg | ORAL_TABLET | Freq: Every day | ORAL | 2 refills | Status: DC

## 2017-05-02 MED ORDER — IBUPROFEN 600 MG PO TABS
600.0000 mg | ORAL_TABLET | Freq: Three times a day (TID) | ORAL | 0 refills | Status: DC | PRN
Start: 2017-05-02 — End: 2022-02-10

## 2017-05-02 NOTE — Progress Notes (Signed)
Used Location manager Hindi  Interpreter: Riz Khan   HA x 2 weeks on and off: pain scale #6 tylenol not helping  No pain today  Not Tobacco, no ETOH user

## 2017-05-02 NOTE — Progress Notes (Signed)
Subjective:  Patient ID: Emily Hendricks, female    DOB: Mar 14, 1956  Age: 62 y.o. MRN: [id number]  CC: Headache and Hypertension   HPI Emily Hendricks presents for follow up. She is accompanied by her son-in-law who interprets for her. History of hypertension. She is not exercising and is low salt diet.  She does not check BP at home. She is adherent with taking medications Cardiac symptoms none. Patient denies chest pain, chest pressure/discomfort, claudication, near-syncope, palpitations and syncope.  Cardiovascular risk factors: advanced age (older than 21 for men, 24 for women), hypertension, sedentary lifestyle and prediabetes. Use of agents associated with hypertension: none. History of target organ damage: none. She c/o headache. Location frontal. She describes headache as throbbing. Pain 6/10 at worst. She denies any weakness of the extremities, N/V, or visual disturbances. She reports taking OTC tylenol and advil for symptoms with minimal relief.  Outpatient Medications Prior to Visit  Medication Sig Dispense Refill  . Cholecalciferol (VITAMIN D PO) Take 1 tablet by mouth daily.    . metFORMIN (GLUCOPHAGE) 500 MG tablet TAKE 1 TABLET BY MOUTH TWICE DAILY WITH MEALS 60 tablet 3  . simvastatin (ZOCOR) 20 MG tablet Take 1 tablet (20 mg total) by mouth daily. 30 tablet 2  . losartan (COZAAR) 25 MG tablet Take 1 tablet (25 mg total) by mouth daily. 30 tablet 2   No facility-administered medications prior to visit.     Review of Systems  Constitutional: Negative.   Eyes: Negative.   Respiratory: Negative.   Cardiovascular: Negative.   Neurological: Positive for headaches.  Psychiatric/Behavioral: Negative.         Objective:  BP (!) 150/96 (BP Location: Left Arm, Patient Position: Sitting, Cuff Size: Normal)   Pulse 73   Temp 97.7 F (36.5 C) (Oral)   Resp 16   Ht 4\' 11"  (1.499 m)   Wt 132 lb (59.9 kg)   SpO2 97%   BMI 26.66 kg/m   BP/Weight 05/02/2017 04/07/2017 12/22/7251    Systolic BP 664 403 474  Diastolic BP 96 93 76  Wt. (Lbs) 132 131 130  BMI 26.66 26.46 23.78   Physical Exam  Constitutional: She is oriented to person, place, and time. She appears well-developed and well-nourished.  HENT:  Head: Normocephalic and atraumatic.  Right Ear: External ear normal.  Left Ear: External ear normal.  Nose: Nose normal.  Mouth/Throat: Oropharynx is clear and moist.  Eyes: EOM are normal. Pupils are equal, round, and reactive to light.  Cardiovascular: Normal rate, regular rhythm, normal heart sounds and intact distal pulses.  Respiratory: Effort normal and breath sounds normal.  Neurological: She is alert and oriented to person, place, and time.  Psychiatric: She has a normal mood and affect.    Assessment & Plan:    1. Essential hypertension Schedule BP recheck in 2 weeks with nurse. If BP is greater than 90/60 (MAP 65 or greater) but not less than 130/80 may increase dose of losartan 100 mg QD and recheck in another 2 weeks.  - losartan (COZAAR) 50 MG tablet; Take 1 tablet (50 mg total) by mouth daily.  Dispense: 30 tablet; Refill: 2  2. Frontal headache  - ibuprofen (ADVIL,MOTRIN) 600 MG tablet; Take 1 tablet (600 mg total) by mouth every 8 (eight) hours as needed for headache. Take with food.  Dispense: 40 tablet; Refill: 0  3. History of prediabetes  - POCT glucose (manual entry)        Follow-up: Return in about 2  weeks (around 05/16/2017) for BP check Travia.   Alfonse Spruce FNP

## 2017-05-04 ENCOUNTER — Ambulatory Visit: Payer: Self-pay | Attending: Family Medicine

## 2017-05-13 ENCOUNTER — Telehealth: Payer: Self-pay | Admitting: Family Medicine

## 2017-05-13 NOTE — Telephone Encounter (Signed)
Patient called me asking about a dental referral she said she was supposed to go in 4 visits and she went to the first one  And no one called her to schedule the rest of the appointments please fu with me so we can call the patient.

## 2017-05-16 NOTE — Telephone Encounter (Signed)
Patient need to contact [facility] for the follow up appointment

## 2017-05-18 ENCOUNTER — Telehealth: Payer: Self-pay | Admitting: Internal Medicine

## 2017-05-18 NOTE — Telephone Encounter (Signed)
Received a fax from [employer] FMLA papers, fax will in the pcp in-box

## 2017-05-25 ENCOUNTER — Ambulatory Visit: Payer: Self-pay

## 2017-07-05 ENCOUNTER — Ambulatory Visit: Payer: Self-pay | Admitting: Sports Medicine

## 2017-07-21 ENCOUNTER — Telehealth: Payer: Self-pay

## 2017-07-21 NOTE — Telephone Encounter (Signed)
[facility] currently are not accepting any referrals for dental at this time. Once we have resumed at our temporary location, I will send out a message to the group.

## 2017-07-26 ENCOUNTER — Ambulatory Visit: Payer: Self-pay | Admitting: Sports Medicine

## 2017-08-09 ENCOUNTER — Encounter: Payer: Self-pay | Admitting: Sports Medicine

## 2017-08-09 ENCOUNTER — Ambulatory Visit: Payer: No Typology Code available for payment source | Admitting: Sports Medicine

## 2017-08-09 VITALS — BP 141/87 | HR 76 | Resp 16

## 2017-08-09 DIAGNOSIS — M79671 Pain in right foot: Secondary | ICD-10-CM

## 2017-08-09 DIAGNOSIS — Q828 Other specified congenital malformations of skin: Secondary | ICD-10-CM

## 2017-08-09 MED ORDER — SALICYLIC ACID 17 % EX GEL: Freq: Every day | CUTANEOUS | 0 refills | Status: DC

## 2017-08-09 NOTE — Progress Notes (Signed)
Subjective: Emily Hendricks is a 62 y.o. female patient who presents to office for evaluation of Right foot pain secondary to callus skin. Patient complains of pain at the lesion present Right plantar lateral foot. Patient has tried pedicure, lotion, stone with no relief in symptoms. Patient denies any other pedal complaints.   Patient is assisted by daughter who helps to report this history.   Review of Systems  Musculoskeletal:       R foot pain  All other systems reviewed and are negative.    There are no active problems to display for this patient.   Current Outpatient Medications on File Prior to Visit  Medication Sig Dispense Refill  . Cholecalciferol (VITAMIN D PO) Take 1 tablet by mouth daily.    Marland Kitchen ibuprofen (ADVIL,MOTRIN) 600 MG tablet Take 1 tablet (600 mg total) by mouth every 8 (eight) hours as needed for headache. Take with food. 40 tablet 0  . losartan (COZAAR) 50 MG tablet Take 1 tablet (50 mg total) by mouth daily. 30 tablet 2  . metFORMIN (GLUCOPHAGE) 500 MG tablet TAKE 1 TABLET BY MOUTH TWICE DAILY WITH MEALS 60 tablet 3  . simvastatin (ZOCOR) 20 MG tablet Take 1 tablet (20 mg total) by mouth daily. 30 tablet 2   No current facility-administered medications on file prior to visit.     Allergies  Allergen Reactions  . Lisinopril Cough    Objective:  General: Alert and oriented x3 in no acute distress  Dermatology: Keratotic lesion present plantar lateral right foot with skin lines transversing the lesion, pain is present with direct pressure to the lesion with a central nucleated core noted, no webspace macerations, no ecchymosis bilateral, all nails x 10 are well manicured.  Vascular: Dorsalis Pedis and Posterior Tibial pedal pulses 2/4, Capillary Fill Time 3 seconds, + pedal hair growth bilateral, no edema bilateral lower extremities, Temperature gradient within normal limits.  Neurology: Johney Maine sensation intact via light touch bilateral.  Musculoskeletal: Mild  tenderness with palpation at the keratotic lesion site on Right, Muscular strength 5/5 in all groups without pain or limitation on range of motion. No lower extremity muscular or boney deformity noted.  Assessment and Plan: Problem List Items Addressed This Visit    None    Visit Diagnoses    Porokeratosis    -  Primary   Right foot pain         -Complete examination performed -Discussed treatment options; Patient was sensitive to area and requested local injection of which I administered around the keratotic lesion then Parred keratoic lesion using a chisel blade; treated the area withSalinocaine covered with moleskin -Rx Sal acid to use at bedtime  -Encouraged daily skin emollients -Encouraged use of pumice stone -Advised good supportive shoes and inserts -Patient to return to office in 1 month if no better may benefit from surgical excision of the lesion or sooner if condition worsens.  Landis Martins, DPM

## 2017-08-16 ENCOUNTER — Other Ambulatory Visit: Payer: Self-pay | Admitting: Family Medicine

## 2017-08-16 DIAGNOSIS — I10 Essential (primary) hypertension: Secondary | ICD-10-CM

## 2017-08-16 MED ORDER — LOSARTAN POTASSIUM 50 MG PO TABS: 50.0000 mg | ORAL_TABLET | Freq: Every day | ORAL | 1 refills | Status: DC

## 2017-08-16 NOTE — Telephone Encounter (Signed)
Patient called requesting a refill on Losartan. Patient uses [facility] pharmacy. Please f/u

## 2017-08-17 ENCOUNTER — Other Ambulatory Visit: Payer: Self-pay | Admitting: *Deleted

## 2017-08-17 DIAGNOSIS — R7303 Prediabetes: Secondary | ICD-10-CM

## 2017-08-17 MED ORDER — METFORMIN HCL 500 MG PO TABS: ORAL_TABLET | ORAL | 1 refills | Status: DC

## 2017-08-24 ENCOUNTER — Ambulatory Visit (HOSPITAL_COMMUNITY)
Admission: RE | Admit: 2017-08-24 | Discharge: 2017-08-24 | Disposition: A | Discharge status: Home / Self Care | Payer: Self-pay | Source: Ambulatory Visit | Attending: Physician Assistant | Admitting: Physician Assistant

## 2017-08-24 ENCOUNTER — Ambulatory Visit: Payer: Self-pay | Attending: Family Medicine | Admitting: Physician Assistant

## 2017-08-24 VITALS — BP 132/83 | HR 82 | Temp 98.6°F | Resp 18 | Ht 61.0 in | Wt 127.0 lb

## 2017-08-24 DIAGNOSIS — E785 Hyperlipidemia, unspecified: Secondary | ICD-10-CM | POA: Insufficient documentation

## 2017-08-24 DIAGNOSIS — M79672 Pain in left foot: Secondary | ICD-10-CM | POA: Insufficient documentation

## 2017-08-24 DIAGNOSIS — I10 Essential (primary) hypertension: Secondary | ICD-10-CM | POA: Insufficient documentation

## 2017-08-24 DIAGNOSIS — Z7984 Long term (current) use of oral hypoglycemic drugs: Secondary | ICD-10-CM | POA: Insufficient documentation

## 2017-08-24 DIAGNOSIS — R7303 Prediabetes: Secondary | ICD-10-CM | POA: Insufficient documentation

## 2017-08-24 DIAGNOSIS — Z79899 Other long term (current) drug therapy: Secondary | ICD-10-CM | POA: Insufficient documentation

## 2017-08-24 LAB — GLUCOSE, POCT (MANUAL RESULT ENTRY): POC GLUCOSE: 126 mg/dL — AB (ref 70–99)

## 2017-08-24 MED ORDER — LOSARTAN POTASSIUM 50 MG PO TABS: 50.0000 mg | ORAL_TABLET | Freq: Every day | ORAL | 3 refills | Status: DC

## 2017-08-24 MED ORDER — VITAMIN D 50 MCG (2000 UT) PO TABS: 2000.0000 [IU] | ORAL_TABLET | Freq: Every day | ORAL | 5 refills | Status: DC

## 2017-08-24 MED ORDER — METFORMIN HCL 500 MG PO TABS: ORAL_TABLET | ORAL | 3 refills | Status: DC

## 2017-08-24 MED ORDER — SIMVASTATIN 20 MG PO TABS: 20.0000 mg | ORAL_TABLET | Freq: Every day | ORAL | 3 refills | Status: DC

## 2017-08-24 NOTE — Progress Notes (Signed)
Patient ID: Emily Hendricks, female   DOB: [birth year], 62 y.o.   MRN: [id number]   Emily Hendricks, is a 62 y.o. female  [id number]  [id number]  DOB - [birth year]  Subjective:  Chief Complaint and HPI: Emily Hendricks is a 62 y.o. female here today for med RF.  Also c/o L foot pain for 2-3 months.  NKI.  No other problems.   ROS:   Constitutional:  No f/c, No night sweats, No unexplained weight loss. EENT:  No vision changes, No blurry vision, No hearing changes. No mouth, throat, or ear problems.  Respiratory: No cough, No SOB Cardiac: No CP, no palpitations GI:  No abd pain, No N/V/D. GU: No Urinary s/sx Musculoskeletal: L foot pain Neuro: No headache, no dizziness, no motor weakness.  Skin: No rash Endocrine:  No polydipsia. No polyuria.  Psych: Denies SI/HI  No problems updated.  ALLERGIES: Allergies  Allergen Reactions  . Lisinopril Cough    PAST MEDICAL HISTORY: Past Medical History:  Diagnosis Date  . Diabetes mellitus without complication ([place])   . Hyperlipidemia   . Hypertension     MEDICATIONS AT HOME: Prior to Admission medications   Medication Sig Start Date End Date Taking? Authorizing Provider  losartan (COZAAR) 50 MG tablet Take 1 tablet (50 mg total) by mouth daily. 08/24/17  Yes McClung, Levada Dy M, PA-C  metFORMIN (GLUCOPHAGE) 500 MG tablet TAKE 1 TABLET BY MOUTH TWICE DAILY WITH MEALS 08/24/17  Yes Freeman Caldron M, PA-C  salicylic acid 17 % gel Apply topically daily. At bedtime and cover with bandaid on right foot 08/09/17  Yes Stover, Titorya, DPM  simvastatin (ZOCOR) 20 MG tablet Take 1 tablet (20 mg total) by mouth daily. 08/24/17  Yes Argentina Donovan, PA-C  Cholecalciferol (VITAMIN D) 2000 units tablet Take 1 tablet (2,000 Units total) by mouth daily. 08/24/17   Argentina Donovan, PA-C  ibuprofen (ADVIL,MOTRIN) 600 MG tablet Take 1 tablet (600 mg total) by mouth every 8 (eight) hours as needed for headache. Take with food. Patient not taking: Reported  on 08/24/2017 05/02/17   Alfonse Spruce, FNP     Objective:  EXAM:   Vitals:   08/24/17 0910  BP: 132/83  Pulse: 82  Resp: 18  Temp: 98.6 F (37 C)  TempSrc: Oral  SpO2: 99%  Weight: 127 lb (57.6 kg)  Height: 5\' 1"  (1.549 m)    General appearance : A&OX3. NAD. Non-toxic-appearing HEENT: Atraumatic and Normocephalic.  PERRLA. EOM intact.  Neck: supple, no JVD. No cervical lymphadenopathy. No thyromegaly Chest/Lungs:  Breathing-non-labored, Good air entry bilaterally, breath sounds normal without rales, rhonchi, or wheezing  CVS: S1 S2 regular, no murmurs, gallops, rubs  Extremities: Bilateral Lower Ext shows no edema, both legs are warm to touch with = pulse throughout L foot exam is normal.  TTP across dorsum of mid-foot.  Neurology:  CN II-XII grossly intact, Non focal.   Psych:  TP linear. J/I WNL. Normal speech. Appropriate eye contact and affect.  Skin:  No Rash  Data Review Lab Results  Component Value Date   HGBA1C 5.7 04/07/2017   HGBA1C 5.7 10/20/2016   HGBA1C 6.0 10/29/2015     Assessment & Plan   1. Pre-diabetes Stable-continue current regimen - Glucose (CBG) - metFORMIN (GLUCOPHAGE) 500 MG tablet; TAKE 1 TABLET BY MOUTH TWICE DAILY WITH MEALS  Dispense: 60 tablet; Refill: 3 - Comprehensive metabolic panel  2. Essential hypertension Controlled-continue - losartan (COZAAR) 50 MG tablet; Take 1 tablet (50 mg  total) by mouth daily.  Dispense: 30 tablet; Refill: 3 - Comprehensive metabolic panel  3. Dyslipidemia continue - simvastatin (ZOCOR) 20 MG tablet; Take 1 tablet (20 mg total) by mouth daily.  Dispense: 30 tablet; Refill: 3  4. Left foot pain Take tylenol or advil for now.  Consider post op shoe - DG Foot 2 Views Left; Future   Patient have been counseled extensively about nutrition and exercise  Return in about 3 months (around 11/24/2017) for assign new PCP; f/up DM and htn.  The patient was given clear instructions to go to ER or  return to medical center if symptoms don't improve, worsen or new problems develop. The patient verbalized understanding. The patient was told to call to get lab results if they haven't heard anything in the next week.     Freeman Caldron, PA-C [facility] [place], [place]   08/24/2017, 9:27 AM

## 2017-08-25 ENCOUNTER — Telehealth: Payer: Self-pay | Admitting: *Deleted

## 2017-08-25 LAB — COMPREHENSIVE METABOLIC PANEL
A/G RATIO: 2.2 (ref 1.2–2.2)
ALT: 15 IU/L (ref 0–32)
AST: 27 IU/L (ref 0–40)
Albumin: 4.8 g/dL (ref 3.6–4.8)
Alkaline Phosphatase: 89 IU/L (ref 39–117)
BILIRUBIN TOTAL: 0.3 mg/dL (ref 0.0–1.2)
BUN/Creatinine Ratio: 21 (ref 12–28)
BUN: 16 mg/dL (ref 8–27)
CALCIUM: 9.3 mg/dL (ref 8.7–10.3)
CHLORIDE: 110 mmol/L — AB (ref 96–106)
CO2: 14 mmol/L — ABNORMAL LOW (ref 20–29)
Creatinine, Ser: 0.77 mg/dL (ref 0.57–1.00)
GFR, EST AFRICAN AMERICAN: 95 mL/min/{1.73_m2} (ref >59)
GFR, EST NON AFRICAN AMERICAN: 82 mL/min/{1.73_m2} (ref >59)
GLOBULIN, TOTAL: 2.2 g/dL (ref 1.5–4.5)
Glucose: 99 mg/dL (ref 65–99)
POTASSIUM: 4.7 mmol/L (ref 3.5–5.2)
SODIUM: 144 mmol/L (ref 134–144)
TOTAL PROTEIN: 7 g/dL (ref 6.0–8.5)

## 2017-08-25 NOTE — Telephone Encounter (Signed)
-----   Message from Argentina Donovan, [place] sent at 08/25/2017  8:17 AM EDT ----- Please call patient.  Labs are normal.  F/up as planned. Thanks, Freeman Caldron, PA-C

## 2017-08-25 NOTE — Telephone Encounter (Signed)
Medical Assistant left message on patient's home and cell voicemail. Voicemail states to give a call back to [place] with [facility] at [phone]. Patient is aware of foot xray showing no fractures and advised to use tylenol for pain and ice with elevation for swelling. Patient is also aware of labs being normal and to follow up as planned.

## 2017-09-06 ENCOUNTER — Ambulatory Visit: Payer: No Typology Code available for payment source | Admitting: Sports Medicine

## 2017-11-22 ENCOUNTER — Ambulatory Visit: Payer: Self-pay | Attending: Family Medicine

## 2017-11-28 ENCOUNTER — Encounter: Payer: Self-pay | Admitting: Nurse Practitioner

## 2017-11-28 ENCOUNTER — Ambulatory Visit: Payer: Self-pay | Attending: Nurse Practitioner | Admitting: Nurse Practitioner

## 2017-11-28 ENCOUNTER — Ambulatory Visit: Payer: Self-pay | Admitting: Nurse Practitioner

## 2017-11-28 VITALS — BP 149/95 | HR 76 | Temp 98.4°F | Ht 61.0 in | Wt 128.2 lb

## 2017-11-28 DIAGNOSIS — I10 Essential (primary) hypertension: Secondary | ICD-10-CM | POA: Insufficient documentation

## 2017-11-28 DIAGNOSIS — Z888 Allergy status to other drugs, medicaments and biological substances status: Secondary | ICD-10-CM | POA: Insufficient documentation

## 2017-11-28 DIAGNOSIS — Z7984 Long term (current) use of oral hypoglycemic drugs: Secondary | ICD-10-CM | POA: Insufficient documentation

## 2017-11-28 DIAGNOSIS — Z79899 Other long term (current) drug therapy: Secondary | ICD-10-CM | POA: Insufficient documentation

## 2017-11-28 DIAGNOSIS — E785 Hyperlipidemia, unspecified: Secondary | ICD-10-CM | POA: Insufficient documentation

## 2017-11-28 DIAGNOSIS — R7303 Prediabetes: Secondary | ICD-10-CM | POA: Insufficient documentation

## 2017-11-28 LAB — POCT GLYCOSYLATED HEMOGLOBIN (HGB A1C): Hemoglobin A1C: 5.7 % — AB (ref 4.0–5.6)

## 2017-11-28 LAB — GLUCOSE, POCT (MANUAL RESULT ENTRY): POC Glucose: 87 mg/dl (ref 70–99)

## 2017-11-28 MED ORDER — SIMVASTATIN 20 MG PO TABS: 20.0000 mg | ORAL_TABLET | Freq: Every day | ORAL | 3 refills | Status: DC

## 2017-11-28 MED ORDER — METFORMIN HCL 500 MG PO TABS: ORAL_TABLET | ORAL | 3 refills | Status: DC

## 2017-11-28 MED ORDER — LOSARTAN POTASSIUM 50 MG PO TABS: 50.0000 mg | ORAL_TABLET | Freq: Every day | ORAL | 3 refills | Status: DC

## 2017-11-28 NOTE — Progress Notes (Signed)
Assessment & Plan:  Emily Hendricks was seen today for establish care.  Diagnoses and all orders for this visit:  Essential hypertension Follow up 3 weeks BP recheck -     losartan (COZAAR) 50 MG tablet; Take 1 tablet (50 mg total) by mouth daily. Continue all antihypertensives as prescribed.  Remember to bring in your blood pressure log with you for your follow up appointment.  DASH/Mediterranean Diets are healthier choices for HTN.    Pre-diabetes -     Glucose (CBG) -     HgB A1c -     metFORMIN (GLUCOPHAGE) 500 MG tablet; TAKE 1 TABLET BY MOUTH TWICE DAILY WITH MEALS Continue blood sugar control as discussed in office today, low carbohydrate diet, and regular physical exercise as tolerated, 150 minutes per week (30 min each day, 5 days per week, or 50 min 3 days per week). Keep blood sugar logs with fasting goal of 90-130 mg/dl, post prandial (after you eat) less than 180.  For Hypoglycemia: BS <60 and Hyperglycemia BS >400; contact the clinic ASAP. Annual eye exams and foot exams are recommended.   Dyslipidemia -     simvastatin (ZOCOR) 20 MG tablet; Take 1 tablet (20 mg total) by mouth daily. INSTRUCTIONS: Work on a low fat, heart healthy diet and participate in regular aerobic exercise program by working out at least 150 minutes per week; 5 days a week-30 minutes per day. Avoid red meat, fried foods. junk foods, sodas, sugary drinks, unhealthy snacking, alcohol and smoking.  Drink at least 48oz of water per day and monitor your carbohydrate intake daily.    Patient has been counseled on age-appropriate routine health concerns for screening and prevention. These are reviewed and up-to-date. Referrals have been placed accordingly. Immunizations are up-to-date or declined.     Patient was urged to apply for the financial assistance program.  They were instructed to inquire at the front desk about the application process for the [place] discount, orange card or other financial  assistance.    Subjective:   Chief Complaint  Patient presents with  . Establish Care    Pt. is here to establish care for prediabetes and hypertension.    HPI Emily Hendricks 62 y.o. female presents to office today to esablish care. She has a history of HTN, HPL and prediabetes. She is accompanied by her son who is interpreting for her today.   CHRONIC HYPERTENSION Disease Monitoring  Blood pressure range Blood pressure is not well controlled today. She endorses medication compliance taking losartan 50mg  daily. She does not monitor her blood pressure at home. She does now own a monitor.  BP Readings from Last 3 Encounters:  11/28/17 (!) 149/95  08/24/17 132/83  08/09/17 (!) 141/87   Chest pain: no   Dyspnea: no   Claudication: no  Medication compliance: yes  Medication Side Effects  Lightheadedness: no   Urinary frequency: no   Edema: no   Impotence: no  Preventitive Healthcare:  Exercise: no   Diet Pattern: diet: general  Salt Restriction:  No  Prediabetes Well controlled. She endorses medication compliance taking metformin 500 mg BID. She denies any hypo or hyperglycemic symptoms. She is overdue for eye exam.  Lab Results  Component Value Date   HGBA1C 5.7 (A) 11/28/2017    Hyperlipidemia Patient presents for follow up to hyperlipidemia.  She is medication compliant taking simvastatin 20mg  as prescribed . She is diet compliant and denies skin xanthelasma or statin intolerance including myalgias.  Lab Results  Component Value Date   CHOL 147 10/27/2016   Lab Results  Component Value Date   HDL 50 10/27/2016   Lab Results  Component Value Date   LDLCALC 79 10/27/2016   Lab Results  Component Value Date   TRIG 88 10/27/2016   Lab Results  Component Value Date   CHOLHDL 2.9 10/27/2016    Review of Systems  Constitutional: Negative for fever, malaise/fatigue and weight loss.  HENT: Negative.  Negative for nosebleeds.   Eyes: Negative.  Negative for  blurred vision, double vision and photophobia.  Respiratory: Negative.  Negative for cough and shortness of breath.   Cardiovascular: Negative.  Negative for chest pain, palpitations and leg swelling.  Gastrointestinal: Negative.  Negative for heartburn, nausea and vomiting.  Musculoskeletal: Negative.  Negative for myalgias.  Neurological: Negative.  Negative for dizziness, focal weakness, seizures and headaches.  Psychiatric/Behavioral: Negative.  Negative for suicidal ideas.    Past Medical History:  Diagnosis Date  . Hyperlipidemia   . Hypertension     Past Surgical History:  Procedure Laterality Date  . NO PAST SURGERIES      Family History  Problem Relation Age of Onset  . Cancer Mother   . Colon cancer Mother   . Cancer Sister     Social History Reviewed with no changes to be made today.   Outpatient Medications Prior to Visit  Medication Sig Dispense Refill  . losartan (COZAAR) 50 MG tablet Take 1 tablet (50 mg total) by mouth daily. 30 tablet 3  . metFORMIN (GLUCOPHAGE) 500 MG tablet TAKE 1 TABLET BY MOUTH TWICE DAILY WITH MEALS 60 tablet 3  . simvastatin (ZOCOR) 20 MG tablet Take 1 tablet (20 mg total) by mouth daily. 30 tablet 3  . Cholecalciferol (VITAMIN D) 2000 units tablet Take 1 tablet (2,000 Units total) by mouth daily. (Patient not taking: Reported on 11/28/2017) 30 tablet 5  . ibuprofen (ADVIL,MOTRIN) 600 MG tablet Take 1 tablet (600 mg total) by mouth every 8 (eight) hours as needed for headache. Take with food. (Patient not taking: Reported on 08/24/2017) 40 tablet 0  . salicylic acid 17 % gel Apply topically daily. At bedtime and cover with bandaid on right foot (Patient not taking: Reported on 11/28/2017) 14 g 0   No facility-administered medications prior to visit.     Allergies  Allergen Reactions  . Lisinopril Cough       Objective:    BP (!) 149/95 (BP Location: Right Arm, Patient Position: Sitting, Cuff Size: Normal)   Pulse 76   Temp 98.4 F  (36.9 C) (Oral)   Ht 5\' 1"  (1.549 m)   Wt 128 lb 3.2 oz (58.2 kg)   SpO2 97%   BMI 24.22 kg/m  Wt Readings from Last 3 Encounters:  11/28/17 128 lb 3.2 oz (58.2 kg)  08/24/17 127 lb (57.6 kg)  05/02/17 132 lb (59.9 kg)    Physical Exam  Constitutional: She is oriented to person, place, and time. She appears well-developed and well-nourished. She is cooperative.  HENT:  Head: Normocephalic and atraumatic.  Eyes: EOM are normal.  Neck: Normal range of motion.  Cardiovascular: Normal rate, regular rhythm, normal heart sounds and intact distal pulses. Exam reveals no gallop and no friction rub.  No murmur heard. Pulmonary/Chest: Effort normal and breath sounds normal. No tachypnea. No respiratory distress. She has no decreased breath sounds. She has no wheezes. She has no rhonchi. She has no rales. She exhibits no tenderness.  Abdominal: Soft.  Bowel sounds are normal.  Musculoskeletal: Normal range of motion. She exhibits no edema.  Neurological: She is alert and oriented to person, place, and time. Coordination normal.  Skin: Skin is warm and dry.  Psychiatric: She has a normal mood and affect. Her behavior is normal. Judgment and thought content normal.  Nursing note and vitals reviewed.     Patient has been counseled extensively about nutrition and exercise as well as the importance of adherence with medications and regular follow-up. The patient was given clear instructions to go to ER or return to medical center if symptoms don't improve, worsen or new problems develop. The patient verbalized understanding.   Follow-up: Return in about 3 weeks (around 12/19/2017) for BP recheck.   Gildardo Pounds, FNP-BC [facility] and [facility], [place]   11/28/2017, 10:40 PM

## 2018-01-04 ENCOUNTER — Ambulatory Visit: Payer: Self-pay | Attending: Nurse Practitioner

## 2018-02-28 ENCOUNTER — Telehealth: Payer: Self-pay | Admitting: Nurse Practitioner

## 2018-02-28 NOTE — Telephone Encounter (Signed)
Patient had already requested a fill of her medication with the pharmacy, this does not require any authorization at this time. The pharmacy will contact them when the RX's are ready for pickup.

## 2018-02-28 NOTE — Telephone Encounter (Signed)
Please call prescription in to [place].  METFORMIN. SIMVASTATIN 20MG  SWETA daughter [phone]. Out of medication with no refills remaining.

## 2018-03-22 DIAGNOSIS — C50919 Malignant neoplasm of unspecified site of unspecified female breast: Secondary | ICD-10-CM

## 2018-03-22 HISTORY — PX: BREAST LUMPECTOMY: SHX2

## 2018-03-22 HISTORY — DX: Malignant neoplasm of unspecified site of unspecified female breast: C50.919

## 2018-03-22 HISTORY — PX: BREAST BIOPSY: SHX20

## 2018-05-01 ENCOUNTER — Other Ambulatory Visit: Payer: Self-pay | Admitting: Nurse Practitioner

## 2018-05-01 DIAGNOSIS — E785 Hyperlipidemia, unspecified: Secondary | ICD-10-CM

## 2018-05-31 ENCOUNTER — Telehealth: Payer: Self-pay | Admitting: Nurse Practitioner

## 2018-05-31 NOTE — Telephone Encounter (Signed)
Received pending documents from Pt that was due on 01/04/18, unfortunately  I can not process these documents since now is 05/31/2018, Pt need to schedule a new appt and bring all new documentsx

## 2018-06-06 ENCOUNTER — Encounter: Payer: Self-pay | Admitting: Nurse Practitioner

## 2018-06-06 ENCOUNTER — Other Ambulatory Visit: Payer: Self-pay

## 2018-06-06 ENCOUNTER — Ambulatory Visit: Payer: Self-pay | Attending: Nurse Practitioner | Admitting: Nurse Practitioner

## 2018-06-06 VITALS — BP 135/82 | HR 78 | Temp 98.0°F | Ht 61.0 in | Wt 132.0 lb

## 2018-06-06 DIAGNOSIS — R519 Headache, unspecified: Secondary | ICD-10-CM

## 2018-06-06 DIAGNOSIS — I10 Essential (primary) hypertension: Secondary | ICD-10-CM

## 2018-06-06 DIAGNOSIS — R222 Localized swelling, mass and lump, trunk: Secondary | ICD-10-CM

## 2018-06-06 DIAGNOSIS — R51 Headache: Secondary | ICD-10-CM

## 2018-06-06 DIAGNOSIS — R7303 Prediabetes: Secondary | ICD-10-CM

## 2018-06-06 LAB — GLUCOSE, POCT (MANUAL RESULT ENTRY): POC GLUCOSE: 138 mg/dL — AB (ref 70–99)

## 2018-06-06 MED ORDER — METFORMIN HCL 500 MG PO TABS: ORAL_TABLET | ORAL | 3 refills | Status: DC

## 2018-06-06 MED ORDER — LOSARTAN POTASSIUM 50 MG PO TABS: 50.0000 mg | ORAL_TABLET | Freq: Every day | ORAL | 3 refills | Status: DC

## 2018-06-06 NOTE — Progress Notes (Signed)
Assessment & Plan:  Emily Hendricks was seen today for mass.  Diagnoses and all orders for this visit:  Pre-diabetes -     Glucose (CBG) -     Cancel: HgB A1c -     Cancel: Hemoglobin A1c -     metFORMIN (GLUCOPHAGE) 500 MG tablet; TAKE 1 TABLET BY MOUTH TWICE DAILY WITH MEALS -     Hemoglobin A1c -     Lipid panel Controlled Continue medications as prescribed.  Continue blood sugar control as discussed in office today, low carbohydrate diet, and regular physical exercise as tolerated, 150 minutes per week (30 min each day, 5 days per week, or 50 min 3 days per week). Keep blood sugar logs with fasting goal of 90-130 mg/dl, post prandial (after you eat) less than 180.  For Hypoglycemia: BS <60 and Hyperglycemia BS >400; contact the clinic ASAP. Annual eye exams and foot exams are recommended.  Essential hypertension -     losartan (COZAAR) 50 MG tablet; Take 1 tablet (50 mg total) by mouth daily. -     CMP14+EGFR Continue all antihypertensives as prescribed.  Remember to bring in your blood pressure log with you for your follow up appointment.  DASH/Mediterranean Diets are healthier choices for HTN.    Mass of right chest wall Referred to [facility] for mammogram.    Frontal Headaches Instructed to avoid all caffeine, electronics or eye strain prior to bedtime.    Patient has been counseled on age-appropriate routine health concerns for screening and prevention. These are reviewed and up-to-date. Referrals have been placed accordingly. Immunizations are up-to-date or declined.    Subjective:   Chief Complaint  Patient presents with  . Mass    Pt. stated she noticed a lump on her chest with no pain. Pt. stated she gets headache and would take Tyelnol and it will go away.    HPI Emily Hendricks 63 y.o. female presents to office today for followup care. She has a history of HTN, HPL and prediabetes. Endorsing frequent headaches today. Her daughter is accompanying her for  translation today.   Headaches Onset a few months ago.  Occurring more frequently over the past few weeks. She is a caffeinated coffee and tea drinker. Headaches will wake her up in the middle of the night around 3 or 4am and are relieved with tylenol or drinking tea. Location of headaches: Frontal headaches. She denies any history of seasonal allergies, any current allergy symptoms, nasal congestion, nausea, vomiting or visual disturbances. Pain described 5-6/10. She has not been to an eye doctor in several years.    Essential Hypertension Well controlled taking losartan 52m daily. Denies chest pain, shortness of breath, palpitations, lightheadedness, dizziness, headaches or BLE edema. She does monitor her sodium intake and avoids processed foods.  BP Readings from Last 3 Encounters:  06/06/18 135/82  11/28/17 (!) 149/95  08/24/17 132/83    Prediabetes Well controlled with taking metformin 500 mg BID. Denies any hypo or hyperglycemic symptoms. Declines vaccines today.  Lab Results  Component Value Date   HGBA1C 5.9 (H) 06/06/2018      Review of Systems  Constitutional: Negative for fever, malaise/fatigue and weight loss.  HENT: Negative.  Negative for nosebleeds.   Eyes: Negative.  Negative for blurred vision, double vision and photophobia.  Respiratory: Negative.  Negative for cough and shortness of breath.   Cardiovascular: Negative.  Negative for chest pain, palpitations and leg swelling.  Gastrointestinal: Negative.  Negative for heartburn, nausea and  vomiting.  Musculoskeletal: Negative.  Negative for myalgias.  Neurological: Positive for headaches. Negative for dizziness, focal weakness and seizures.  Psychiatric/Behavioral: Negative.  Negative for suicidal ideas.    Past Medical History:  Diagnosis Date  . Hyperlipidemia   . Hypertension     Past Surgical History:  Procedure Laterality Date  . NO PAST SURGERIES      Family History  Problem Relation Age of Onset   . Cancer Mother   . Colon cancer Mother   . Cancer Sister     Social History Reviewed with no changes to be made today.   Outpatient Medications Prior to Visit  Medication Sig Dispense Refill  . Cholecalciferol (VITAMIN D) 2000 units tablet Take 1 tablet (2,000 Units total) by mouth daily. 30 tablet 5  . ibuprofen (ADVIL,MOTRIN) 600 MG tablet Take 1 tablet (600 mg total) by mouth every 8 (eight) hours as needed for headache. Take with food. 40 tablet 0  . simvastatin (ZOCOR) 20 MG tablet TAKE 1 TABLET BY MOUTH DAILY. 30 tablet 2  . losartan (COZAAR) 50 MG tablet Take 1 tablet (50 mg total) by mouth daily. 30 tablet 3  . metFORMIN (GLUCOPHAGE) 500 MG tablet TAKE 1 TABLET BY MOUTH TWICE DAILY WITH MEALS 60 tablet 3  . salicylic acid 17 % gel Apply topically daily. At bedtime and cover with bandaid on right foot (Patient not taking: Reported on 11/28/2017) 14 g 0   No facility-administered medications prior to visit.     Allergies  Allergen Reactions  . Lisinopril Cough       Objective:    BP 135/82 (BP Location: Right Arm, Patient Position: Sitting, Cuff Size: Normal)   Pulse 78   Temp 98 F (36.7 C) (Oral)   Ht 5' 1"  (1.549 m)   Wt 132 lb (59.9 kg)   SpO2 97%   BMI 24.94 kg/m  Wt Readings from Last 3 Encounters:  06/06/18 132 lb (59.9 kg)  11/28/17 128 lb 3.2 oz (58.2 kg)  08/24/17 127 lb (57.6 kg)    Physical Exam Vitals signs and nursing note reviewed.  Constitutional:      Appearance: She is well-developed.  HENT:     Head: Normocephalic and atraumatic.  Neck:     Musculoskeletal: Normal range of motion.  Cardiovascular:     Rate and Rhythm: Normal rate and regular rhythm.     Heart sounds: Normal heart sounds. No murmur. No friction rub. No gallop.   Pulmonary:     Effort: Pulmonary effort is normal. No tachypnea or respiratory distress.     Breath sounds: Normal breath sounds. No decreased breath sounds, wheezing, rhonchi or rales.  Chest:     Chest wall:  Mass present. No tenderness.       Comments: Patient notes first detection of mass was 4 days ago. She states she routinely performs self breast exams. She is overdue for mammogram, bone density and PAP Abdominal:     General: Bowel sounds are normal.     Palpations: Abdomen is soft.  Musculoskeletal: Normal range of motion.  Skin:    General: Skin is warm and dry.  Neurological:     Mental Status: She is alert and oriented to person, place, and time.     Cranial Nerves: Cranial nerves are intact.     Sensory: Sensation is intact.     Motor: Motor function is intact.     Coordination: Coordination is intact. Coordination normal.  Psychiatric:  Behavior: Behavior normal. Behavior is cooperative.        Thought Content: Thought content normal.        Judgment: Judgment normal.        Patient has been counseled extensively about nutrition and exercise as well as the importance of adherence with medications and regular follow-up. The patient was given clear instructions to go to ER or return to medical center if symptoms don't improve, worsen or new problems develop. The patient verbalized understanding.   Follow-up: Return in about 3 weeks (around 06/27/2018) for Headaches.   Gildardo Pounds, FNP-BC [facility] and [facility] [place], [place]   06/07/2018, 11:36 AM

## 2018-06-07 ENCOUNTER — Encounter: Payer: Self-pay | Admitting: Nurse Practitioner

## 2018-06-07 LAB — CMP14+EGFR
ALT: 10 IU/L (ref 0–32)
AST: 18 IU/L (ref 0–40)
Albumin/Globulin Ratio: 2 (ref 1.2–2.2)
Albumin: 4.3 g/dL (ref 3.8–4.8)
Alkaline Phosphatase: 83 IU/L (ref 39–117)
BUN/Creatinine Ratio: 23 (ref 12–28)
BUN: 15 mg/dL (ref 8–27)
Bilirubin Total: <0.2 mg/dL (ref 0.0–1.2)
CO2: 24 mmol/L (ref 20–29)
Calcium: 9 mg/dL (ref 8.7–10.3)
Chloride: 106 mmol/L (ref 96–106)
Creatinine, Ser: 0.65 mg/dL (ref 0.57–1.00)
GFR calc non Af Amer: 95 mL/min/{1.73_m2} (ref >59)
GFR, EST AFRICAN AMERICAN: 109 mL/min/{1.73_m2} (ref >59)
Globulin, Total: 2.2 g/dL (ref 1.5–4.5)
Glucose: 125 mg/dL — ABNORMAL HIGH (ref 65–99)
Potassium: 4 mmol/L (ref 3.5–5.2)
Sodium: 142 mmol/L (ref 134–144)
Total Protein: 6.5 g/dL (ref 6.0–8.5)

## 2018-06-07 LAB — HEMOGLOBIN A1C
Est. average glucose Bld gHb Est-mCnc: 123 mg/dL
Hgb A1c MFr Bld: 5.9 % — ABNORMAL HIGH (ref 4.8–5.6)

## 2018-06-07 LAB — LIPID PANEL
CHOL/HDL RATIO: 3.1 ratio (ref 0.0–4.4)
Cholesterol, Total: 175 mg/dL (ref 100–199)
HDL: 56 mg/dL (ref >39)
LDL Calculated: 102 mg/dL — ABNORMAL HIGH (ref 0–99)
TRIGLYCERIDES: 85 mg/dL (ref 0–149)
VLDL Cholesterol Cal: 17 mg/dL (ref 5–40)

## 2018-06-08 ENCOUNTER — Telehealth: Payer: Self-pay

## 2018-06-08 NOTE — Telephone Encounter (Signed)
-----   Message from Gildardo Pounds, NP sent at 06/07/2018 12:05 PM EDT ----- Labs are normal. A1c still showing prediabetes. Kidney and liver function are normal as well as cholesterol levels.

## 2018-06-08 NOTE — Telephone Encounter (Signed)
CMA spoke to patient's daughter to inform on lab results.

## 2018-06-12 ENCOUNTER — Other Ambulatory Visit (HOSPITAL_COMMUNITY): Payer: Self-pay | Admitting: *Deleted

## 2018-06-12 DIAGNOSIS — N6310 Unspecified lump in the right breast, unspecified quadrant: Secondary | ICD-10-CM

## 2018-06-28 ENCOUNTER — Encounter (HOSPITAL_COMMUNITY): Payer: Self-pay | Admitting: *Deleted

## 2018-06-28 ENCOUNTER — Telehealth (HOSPITAL_COMMUNITY): Payer: Self-pay | Admitting: *Deleted

## 2018-06-28 NOTE — Progress Notes (Signed)
No travel outside the state in the past 14 days.

## 2018-06-28 NOTE — Telephone Encounter (Signed)
Telephoned patient at home number and left message to return call to [facility]. Advised need to speak to patient prior to appointment other wise would cancel appointment.

## 2018-06-28 NOTE — Telephone Encounter (Signed)
Patient's daughter returned call. Screened both daughter and patient. No symptoms of COVID-19. No contact with someone with a confirmed diagnosis of COVID-19

## 2018-06-29 ENCOUNTER — Other Ambulatory Visit (HOSPITAL_COMMUNITY): Payer: Self-pay | Admitting: Obstetrics and Gynecology

## 2018-06-29 ENCOUNTER — Ambulatory Visit
Admission: RE | Admit: 2018-06-29 | Discharge: 2018-06-29 | Disposition: A | Discharge status: Home / Self Care | Payer: Self-pay | Source: Ambulatory Visit | Attending: Obstetrics and Gynecology | Admitting: Obstetrics and Gynecology

## 2018-06-29 ENCOUNTER — Other Ambulatory Visit: Payer: Self-pay

## 2018-06-29 ENCOUNTER — Ambulatory Visit (HOSPITAL_COMMUNITY)
Admission: RE | Admit: 2018-06-29 | Discharge: 2018-06-29 | Disposition: A | Discharge status: Home / Self Care | Payer: Self-pay | Source: Ambulatory Visit | Attending: Obstetrics and Gynecology | Admitting: Obstetrics and Gynecology

## 2018-06-29 ENCOUNTER — Encounter (HOSPITAL_COMMUNITY): Payer: Self-pay

## 2018-06-29 VITALS — BP 128/76 | Temp 97.6°F | Wt 133.0 lb

## 2018-06-29 DIAGNOSIS — N6310 Unspecified lump in the right breast, unspecified quadrant: Secondary | ICD-10-CM

## 2018-06-29 DIAGNOSIS — Z1239 Encounter for other screening for malignant neoplasm of breast: Secondary | ICD-10-CM

## 2018-06-29 NOTE — Patient Instructions (Signed)
Explained breast self awareness with [name]. Patient did not need a Pap smear today due to last Pap smear was in July 2017 per patient. Let her know [facility] will cover Pap smears every 3 years unless has a history of abnormal Pap smears. Referred patient to the [facility] for a bilateral diagnostic mammogram and right breast ultrasound. Appointment scheduled for Thursday, June 29, 2018 at [place]. Patient aware of appointment and will be there. Meyli Drzewiecki verbalized understanding.  Gitel Beste, Arvil Chaco, RN 9:58 AM

## 2018-06-29 NOTE — Progress Notes (Signed)
Complaints of a right breast lump x one month.  Pap Smear: Pap smear not completed today. Last Pap smear was in July 2017 and normal per patient. Per patient has no history of an abnormal Pap smear. No Pap smear results are in Epic.  Physical exam: Breasts Breasts symmetrical. No skin abnormalities bilateral breasts. No nipple retraction bilateral breasts. No nipple discharge bilateral breasts. No lymphadenopathy. No lumps palpated left breast. Palpated a lump within the right breast between 12-1 o'clock 6 cm from the nipple. No complaints of pain or tenderness on exam. Referred patient to the [facility] for a bilateral diagnostic mammogram and right breast ultrasound. Appointment scheduled for Thursday, June 29, 2018 at [place].        Pelvic/Bimanual No Pap smear completed today since last Pap smear was in July 2017. Pap smear not indicated per BCCCP guidelines.   Smoking History: Patient has never smoked.  Patient Navigation: Patient education provided. Access to services provided for patient through [facility] program. Patients daughter signed form to translate Hindi for patient due to [employer] interpreter showed up at wrong location.   Colorectal Cancer Screening: Patient had a colonoscopy completed 12/27/2016. No complaints today.   Breast and Cervical Cancer Risk Assessment: Patient has a family history of her mother and sister having breast cancer. Patient has no known genetic mutations or history of radiation treatment to the chest before age 21. Patient has no history of cervical dysplasia, immunocompromised, or DES exposure in-utero.  Risk Assessment    Risk Scores      06/29/2018   Last edited by: Armond Hang, LPN   5-year risk: 4.2 %   Lifetime risk: 17.1 %         Patient and daughter Zyairah Wacha signed waiver of rights for free interpreter.

## 2018-07-03 ENCOUNTER — Other Ambulatory Visit: Payer: Self-pay

## 2018-07-03 ENCOUNTER — Ambulatory Visit
Admission: RE | Admit: 2018-07-03 | Discharge: 2018-07-03 | Disposition: A | Discharge status: Home / Self Care | Payer: No Typology Code available for payment source | Source: Ambulatory Visit | Attending: Obstetrics and Gynecology | Admitting: Obstetrics and Gynecology

## 2018-07-03 DIAGNOSIS — N6310 Unspecified lump in the right breast, unspecified quadrant: Secondary | ICD-10-CM

## 2018-07-06 ENCOUNTER — Telehealth: Payer: Self-pay | Admitting: *Deleted

## 2018-07-06 ENCOUNTER — Telehealth: Payer: Self-pay | Admitting: Oncology

## 2018-07-06 DIAGNOSIS — C50211 Malignant neoplasm of upper-inner quadrant of right female breast: Secondary | ICD-10-CM

## 2018-07-06 DIAGNOSIS — Z170 Estrogen receptor positive status [ER+]: Principal | ICD-10-CM

## 2018-07-06 NOTE — Telephone Encounter (Signed)
Spoke with patient's daughter concerning her [facility] appointment for 4/22.  She states that the patient will come to [facility].  She wants to be on a phone call during the meeting with the physicians and told her that would be fine.  She states that even with an interpreter that her mother would have a hard time understanding the recommendations for treatment.  Gave her my contact information and encouraged her to call with any questions or concerns.

## 2018-07-06 NOTE — Telephone Encounter (Signed)
Spoke with patients daughter to give morning [facility] appointment for 4/22, explained to the daughter the mother would have to come alone, daughter is not okay with this, will mail packet

## 2018-07-11 ENCOUNTER — Other Ambulatory Visit: Payer: Self-pay | Admitting: Licensed Clinical Social Worker

## 2018-07-11 DIAGNOSIS — C50211 Malignant neoplasm of upper-inner quadrant of right female breast: Secondary | ICD-10-CM

## 2018-07-11 DIAGNOSIS — Z803 Family history of malignant neoplasm of breast: Secondary | ICD-10-CM

## 2018-07-11 NOTE — Progress Notes (Signed)
[place]  Telephone:([phone] [phone]    ID: Emily Hendricks DOB: 02/23/1956  MR#: [id number]  FBP#:[id number]  Patient Care Team: Gildardo Pounds, NP as PCP - General (Nurse Practitioner) Mauro Kaufmann, RN as Oncology Nurse Navigator Rockwell Germany, RN as Oncology Nurse Navigator Stark Klein, MD as Consulting Physician (General Surgery) Rich Paprocki, Emily Dad, MD as Consulting Physician (Oncology) Gery Pray, MD as Consulting Physician (Radiation Oncology) OTHER MD:   CHIEF COMPLAINT: Estrogen receptor positive breast cancer  CURRENT TREATMENT: Anastrozole   HISTORY OF CURRENT ILLNESS: Emily Hendricks had a right breast lump about a month ago; she then went to [facility] to be evaluated.  She underwent bilateral diagnostic mammography with tomography and right breast ultrasonography at [facility] on 06/29/2018 showing: Breast Density Category C. No dominant masses, suspicious calcifications or secondary signs of malignancy are identified within the LEFT breast.   There is a spiculated mass within the upper slightly inner RIGHT breast, at far posterior depth, measuring approximately 2.2 cm greatest dimension, corresponding to the area of clinical concern. No dominant masses, suspicious calcifications or secondary signs of malignancy are identified elsewhere within the RIGHT breast. . Targeted ultrasound is performed, showing an irregular hypoechoic mass in the RIGHT breast at the 12:30 o'clock axis, 8 cm from the nipple, measuring 2.7 x 1.7 x 1.9 cm. The mass abuts and may involve the pectoralis muscle. RIGHT axilla was evaluated with ultrasound showing no enlarged or morphologically abnormal lymph nodes.  Accordingly on 07/03/2018 she proceeded to biopsy of the right breast area in question. 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  Prognostic indicators significant for: estrogen receptor, 95% positive with strong staining intensity and progesterone receptor, 10% positive with weak staining intensity. Proliferation marker Ki67 at 5%. HER2 equivocal (2+) by immunohistochemistry but reportedly negative by fluorescent in situ hybridization.  The patient's subsequent history is as detailed below.   INTERVAL HISTORY: Emily Hendricks was evaluated in the multidisciplinary breast cancer clinic on 07/12/2018. She is accompanied by her daughter via telephone. She is also accompanied by a Emily Hendricks interpreter.   Her case was also presented at the multidisciplinary breast cancer conference on the same day. At that time a preliminary plan was proposed: MRI to further evaluate the concern regarding pectoralis muscle involvement; Oncotype to be obtained on the biopsy; neoadjuvant treatment to further chemotherapy or antiestrogen; genetics testing     REVIEW OF SYSTEMS: On a normal day, Emily Hendricks completes general house work. More frequently, she has been taking care of her husband and her mother-in-law, who have medical conditions that affect their ability to care for themselves.  Emily Hendricks's daughter notes over the phone that she has headaches with high blood pressures, usually in the afternoon or evening, which she treats with Tylenol.   The patient denies unusual visual changes, nausea, vomiting, stiff neck, dizziness, or gait imbalance. There has been no cough, phlegm production, or pleurisy, no chest pain or pressure, and no change in bowel or bladder habits. The patient denies fever, rash, bleeding, unexplained fatigue or unexplained weight loss. A detailed review of systems was otherwise entirely negative.   PAST MEDICAL HISTORY: Past Medical History:  Diagnosis Date   Diabetes mellitus without complication ([place])    Hyperlipidemia    Hypertension      PAST SURGICAL HISTORY: Past Surgical History:  Procedure Laterality Date     NO PAST SURGERIES  TUBAL LIGATION    Tubal ligation in 1990 or 1991.    FAMILY HISTORY: Family History  Problem Relation Age of Onset   Breast cancer Mother    Breast cancer Sister    Legend's father died from heart complications following a heart surgery about 45 years ago. Patients' mother died from unknown cancer in her 72's. The patient has 6 brothers and 1 sisters. Her sister had cancer of unknown type, but that was in her lower abdomen. Patient denies anyone else in her family having cancer.     GYNECOLOGIC HISTORY:  No LMP recorded. Patient is postmenopausal. 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 She moved here from [place], [place] in 2005. Her husband is partially disabled. At home with them is her mother-in-law, her son, and her daughter-in-law. Arretta's son's name is Emily Hendricks.  The patient's daughter, Emily Hendricks, lives in [place] with her husband  ADVANCED DIRECTIVES: In the absence of any documentation, Emily Hendricks's spouse, is her healthcare power of attorney. The appropriate documentation is provided on 07/12/2018 for her to review, notarize, and return and her earliest convenience.  The patient intends to name her daughter Emily Hendricks as her healthcare power of attorney.  She also has the daughter's phone number as her primary contact   HEALTH MAINTENANCE: Social History   Tobacco Use   Smoking status: Never Smoker   Smokeless tobacco: Never Used  Substance Use Topics   Alcohol use: No   Drug use: No    Colonoscopy:   PAP:   Bone density:   Allergies  Allergen Reactions   Lisinopril Cough    Current Outpatient Medications  Medication Sig Dispense Refill   Cholecalciferol (VITAMIN D) 2000 units tablet Take 1 tablet  (2,000 Units total) by mouth daily. 30 tablet 5   ibuprofen (ADVIL,MOTRIN) 600 MG tablet Take 1 tablet (600 mg total) by mouth every 8 (eight) hours as needed for headache. Take with food. 40 tablet 0   losartan (COZAAR) 50 MG tablet Take 1 tablet (50 mg total) by mouth daily. 90 tablet 3   metFORMIN (GLUCOPHAGE) 500 MG tablet TAKE 1 TABLET BY MOUTH TWICE DAILY WITH MEALS 60 tablet 3   salicylic acid 17 % gel Apply topically daily. At bedtime and cover with bandaid on right foot (Patient not taking: Reported on 11/28/2017) 14 g 0   simvastatin (ZOCOR) 20 MG tablet TAKE 1 TABLET BY MOUTH DAILY. 30 tablet 2   No current facility-administered medications for this visit.      OBJECTIVE: Middle-aged woman originally from [place], in no acute distress  Vitals:   07/12/18 0921  BP: (!) 156/80  Pulse: 84  Resp: 18  Temp: 98.1 F (36.7 C)  SpO2: 100%     Body mass index is 25.13 kg/m.   Wt Readings from Last 3 Encounters:  07/12/18 133 lb (60.3 kg)  06/29/18 133 lb (60.3 kg)  06/06/18 132 lb (59.9 kg)      ECOG FS:0 - Asymptomatic  Ocular: Sclerae unicteric, pupils round and equal Lymphatic: No cervical or supraclavicular adenopathy Lungs no rales or rhonchi Heart regular rate and rhythm Abd soft, nontender, positive bowel sounds MSK no focal spinal tenderness, no joint edema Neuro: non-focal, well-oriented, appropriate affect Breasts: The mass in the  upper right breast is easily visible, and it is almost in the chest wall, it is so superior.  There are no skin changes associated with this and no nipple changes.  The left breast is unremarkable I do not palpate a mass in either axilla   LAB RESULTS:  CMP     Component Value Date/Time   NA 142 07/12/2018 0848   NA 142 06/06/2018 1639   K 4.3 07/12/2018 0848   CL 107 07/12/2018 0848   CO2 26 07/12/2018 0848   GLUCOSE 93 07/12/2018 0848   BUN 12 07/12/2018 0848   BUN 15 06/06/2018 1639   CREATININE 0.83 07/12/2018 0848    CREATININE 0.78 02/03/2016 1613   CALCIUM 9.3 07/12/2018 0848   PROT 7.3 07/12/2018 0848   PROT 6.5 06/06/2018 1639   ALBUMIN 3.9 07/12/2018 0848   ALBUMIN 4.3 06/06/2018 1639   AST 17 07/12/2018 0848   ALT 12 07/12/2018 0848   ALKPHOS 88 07/12/2018 0848   BILITOT 0.5 07/12/2018 0848   GFRNONAA >60 07/12/2018 0848   GFRNONAA 82 02/03/2016 1613   GFRAA >60 07/12/2018 0848   GFRAA >89 02/03/2016 1613    No results found for: TOTALPROTELP, ALBUMINELP, A1GS, A2GS, BETS, BETA2SER, GAMS, MSPIKE, SPEI  No results found for: KPAFRELGTCHN, LAMBDASER, KAPLAMBRATIO  Lab Results  Component Value Date   WBC 4.5 07/12/2018   NEUTROABS 2.2 07/12/2018   HGB 11.9 (L) 07/12/2018   HCT 39.7 07/12/2018   MCV 87.1 07/12/2018   PLT 231 07/12/2018    @LASTCHEMISTRY @  No results found for: LABCA2  No components found for: DZHGDJ242  No results for input(s): INR in the last 168 hours.  No results found for: LABCA2  No results found for: AST419  No results found for: QQI297  No results found for: LGX211  No results found for: CA2729  No components found for: HGQUANT  No results found for: CEA1 / No results found for: CEA1   No results found for: AFPTUMOR  No results found for: [place]  No results found for: PSA1  Appointment on 07/12/2018  Component Date Value Ref Range Status   WBC Count 07/12/2018 4.5  4.0 - 10.5 K/uL Final   RBC 07/12/2018 4.56  3.87 - 5.11 MIL/uL Final   Hemoglobin 07/12/2018 11.9* 12.0 - 15.0 g/dL Final   HCT 07/12/2018 39.7  36.0 - 46.0 % Final   MCV 07/12/2018 87.1  80.0 - 100.0 fL Final   MCH 07/12/2018 26.1  26.0 - 34.0 pg Final   MCHC 07/12/2018 30.0  30.0 - 36.0 g/dL Final   RDW 07/12/2018 12.8  11.5 - 15.5 % Final   Platelet Count 07/12/2018 231  150 - 400 K/uL Final   nRBC 07/12/2018 0.0  0.0 - 0.2 % Final   Neutrophils Relative % 07/12/2018 49  % Final   Neutro Abs 07/12/2018 2.2  1.7 - 7.7 K/uL Final   Lymphocytes Relative  07/12/2018 38  % Final   Lymphs Abs 07/12/2018 1.7  0.7 - 4.0 K/uL Final   Monocytes Relative 07/12/2018 7  % Final   Monocytes Absolute 07/12/2018 0.3  0.1 - 1.0 K/uL Final   Eosinophils Relative 07/12/2018 5  % Final   Eosinophils Absolute 07/12/2018 0.2  0.0 - 0.5 K/uL Final   Basophils Relative 07/12/2018 1  % Final   Basophils Absolute 07/12/2018 0.0  0.0 - 0.1 K/uL Final   Immature Granulocytes 07/12/2018 0  % Final   Abs Immature Granulocytes 07/12/2018 0.01  0.00 - 0.07 K/uL Final   Performed at [facility], [place] [address], [place], [place] [address]   Sodium 07/12/2018 142  135 - 145 mmol/L Final   Potassium 07/12/2018 4.3  3.5 - 5.1 mmol/L Final   Chloride 07/12/2018 107  98 - 111 mmol/L Final   CO2 07/12/2018 26  22 - 32 mmol/L Final   Glucose, Bld 07/12/2018 93  70 - 99 mg/dL Final   BUN 07/12/2018 12  8 - 23 mg/dL Final   Creatinine 07/12/2018 0.83  0.44 - 1.00 mg/dL Final   Calcium 07/12/2018 9.3  8.9 - 10.3 mg/dL Final   Total Protein 07/12/2018 7.3  6.5 - 8.1 g/dL Final   Albumin 07/12/2018 3.9  3.5 - 5.0 g/dL Final   AST 07/12/2018 17  15 - 41 U/L Final   ALT 07/12/2018 12  0 - 44 U/L Final   Alkaline Phosphatase 07/12/2018 88  38 - 126 U/L Final   Total Bilirubin 07/12/2018 0.5  0.3 - 1.2 mg/dL Final   GFR, Est Non Af Am 07/12/2018 >60  >60 mL/min Final   GFR, Est AFR Am 07/12/2018 >60  >60 mL/min Final   Anion gap 07/12/2018 9  5 - 15 Final   Performed at [facility], [place] [address], [place], [place] [address]    (this displays the last labs from the last 3 days)  No results found for: TOTALPROTELP, ALBUMINELP, A1GS, A2GS, BETS, BETA2SER, GAMS, MSPIKE, SPEI (this displays SPEP labs)  No results found for: KPAFRELGTCHN, LAMBDASER, KAPLAMBRATIO (kappa/lambda light chains)  No results found for: HGBA, HGBA2QUANT, HGBFQUANT, HGBSQUAN (Hemoglobinopathy evaluation)   No results found  for: LDH  No results found for: IRON, TIBC, IRONPCTSAT (Iron and TIBC)  No results found for: FERRITIN  Urinalysis No results found for: COLORURINE, APPEARANCEUR, LABSPEC, PHURINE, GLUCOSEU, HGBUR, BILIRUBINUR, KETONESUR, PROTEINUR, UROBILINOGEN, NITRITE, LEUKOCYTESUR   STUDIES:  US Breast Ltd Uni Right Inc Axilla  Result Date: 06/29/2018 CLINICAL DATA:  RIGHT breast lump for 2-3 months. This is patient's baseline mammogram. EXAM: DIGITAL DIAGNOSTIC BILATERAL MAMMOGRAM WITH CAD AND TOMO ULTRASOUND RIGHT BREAST COMPARISON:  None. ACR Breast Density Category c: The breast tissue is heterogeneously dense, which may obscure small masses. FINDINGS: There is a spiculated mass within the upper slightly inner RIGHT breast, at far posterior depth, measuring approximately 2.2 cm greatest dimension, corresponding to the area of clinical concern. No dominant masses, suspicious calcifications or secondary signs of malignancy are identified elsewhere within the RIGHT breast. No dominant masses, suspicious calcifications or secondary signs of malignancy are identified within the LEFT breast. Mammographic images were processed with CAD. Targeted ultrasound is performed, showing an irregular hypoechoic mass in the RIGHT breast at the 12:30 o'clock axis, 8 cm from the nipple, measuring 2.7 x 1.7 x 1.9 cm. The mass abuts and may involve the pectoralis muscle. RIGHT axilla was evaluated with ultrasound showing no enlarged or morphologically abnormal lymph nodes. IMPRESSION: 1. Highly suspicious mass in the RIGHT breast at the 12:30 o'clock axis, 8 cm from the nipple, measuring 2.7 cm. The mass abuts and may involve the pectoralis muscle. Ultrasound-guided biopsy is recommended. 2. No evidence of malignancy within the LEFT breast. RECOMMENDATION: 1. Ultrasound-guided biopsy of the RIGHT breast mass. 2. If biopsy results are positive for carcinoma, would recommend breast MRI to exclude underlying chest wall involvement.  Ultrasound-guided biopsy is scheduled for April 13th. I have discussed the findings and recommendations with the patient. Results were  also provided in writing at the conclusion of the visit. If applicable, a reminder letter will be sent to the patient regarding the next appointment. BI-RADS CATEGORY  5: Highly suggestive of malignancy. Electronically Signed   By: Franki Cabot M.D.   On: 06/29/2018 10:32   Ms Digital Diag Tomo Bilat  Result Date: 06/29/2018 CLINICAL DATA:  RIGHT breast lump for 2-3 months. This is patient's baseline mammogram. EXAM: DIGITAL DIAGNOSTIC BILATERAL MAMMOGRAM WITH CAD AND TOMO ULTRASOUND RIGHT BREAST COMPARISON:  None. ACR Breast Density Category c: The breast tissue is heterogeneously dense, which may obscure small masses. FINDINGS: There is a spiculated mass within the upper slightly inner RIGHT breast, at far posterior depth, measuring approximately 2.2 cm greatest dimension, corresponding to the area of clinical concern. No dominant masses, suspicious calcifications or secondary signs of malignancy are identified elsewhere within the RIGHT breast. No dominant masses, suspicious calcifications or secondary signs of malignancy are identified within the LEFT breast. Mammographic images were processed with CAD. Targeted ultrasound is performed, showing an irregular hypoechoic mass in the RIGHT breast at the 12:30 o'clock axis, 8 cm from the nipple, measuring 2.7 x 1.7 x 1.9 cm. The mass abuts and may involve the pectoralis muscle. RIGHT axilla was evaluated with ultrasound showing no enlarged or morphologically abnormal lymph nodes. IMPRESSION: 1. Highly suspicious mass in the RIGHT breast at the 12:30 o'clock axis, 8 cm from the nipple, measuring 2.7 cm. The mass abuts and may involve the pectoralis muscle. Ultrasound-guided biopsy is recommended. 2. No evidence of malignancy within the LEFT breast. RECOMMENDATION: 1. Ultrasound-guided biopsy of the RIGHT breast mass. 2. If biopsy  results are positive for carcinoma, would recommend breast MRI to exclude underlying chest wall involvement. Ultrasound-guided biopsy is scheduled for April 13th. I have discussed the findings and recommendations with the patient. Results were also provided in writing at the conclusion of the visit. If applicable, a reminder letter will be sent to the patient regarding the next appointment. BI-RADS CATEGORY  5: Highly suggestive of malignancy. Electronically Signed   By: Franki Cabot M.D.   On: 06/29/2018 10:32   Mm Clip Placement Right  Result Date: 07/03/2018 CLINICAL DATA:  Status post ultrasound-guided biopsy of a RIGHT breast mass at the 12:30 o'clock axis. EXAM: DIAGNOSTIC RIGHT MAMMOGRAM POST ULTRASOUND BIOPSY COMPARISON:  Previous exam(s). FINDINGS: Mammographic images were obtained following ultrasound guided biopsy of the RIGHT breast mass at the 12:30 o'clock axis. Ribbon shaped clip is well-positioned within the mass. IMPRESSION: Ribbon shaped clip is well-positioned within the targeted mass in the RIGHT breast at the 12:30 o'clock axis. Final Assessment: Post Procedure Mammograms for Marker Placement Electronically Signed   By: Franki Cabot M.D.   On: 07/03/2018 13:52   [place] Rt Breast Bx W Loc Dev 1st Lesion Img Bx Spec US Guide  Addendum Date: 07/07/2018   ADDENDUM REPORT: 07/05/2018 11:18 ADDENDUM: Pathology revealed GRADE II INVASIVE MAMMARY CARCINOMA of the Right breast, 12:30 o'clock. This was found to be concordant by Dr. Franki Cabot. Pathology results were discussed with the patient's daughter, Emily Hendricks by telephone, per patient request. The patient's daughter reported her mother did well after the biopsy with tenderness at the site. Post biopsy instructions and care were reviewed and questions were answered. The patient's daughter was encouraged to call [facility] for any additional concerns. The patient was [facility] to [facility] on July 12, 2018.  Recommendation for a bilateral breast MRI to exclude underlying chest wall involvement, as the mass abuts and may involve the pectoralis muscle, and the patient has heterogeneously dense breasts. Pathology results reported by Terie Purser, RN on 07/05/2018. Electronically Signed   By: Franki Cabot M.D.   On: 07/05/2018 11:18   Result Date: 07/07/2018 CLINICAL DATA:  Patient with a RIGHT breast mass presents today for ultrasound guided core biopsy. EXAM: ULTRASOUND GUIDED RIGHT BREAST CORE NEEDLE BIOPSY COMPARISON:  Previous exam(s). PROCEDURE: I met with the patient and we discussed the procedure of ultrasound-guided biopsy, including benefits and alternatives. We discussed the high likelihood of a successful procedure. We discussed the risks of the procedure including infection, bleeding, tissue injury, clip migration, and inadequate sampling. Informed written consent was given. The usual time-out protocol was performed immediately prior to the procedure. Lesion quadrant: Upper inner quadrant Using sterile technique and 1% Lidocaine as local anesthetic, under direct ultrasound visualization, a 12 gauge spring-loaded device was used to perform biopsy of the RIGHT breast mass at the 12:30 o'clock axisusing a inferolateral approach. At the conclusion of the procedure, a ribbon shaped tissue marker clip was deployed into the biopsy cavity. Follow-up 2-view mammogram was performed and dictated separately. IMPRESSION: Ultrasound-guided biopsy of the RIGHT breast mass at the 12:30 o'clock axis. No apparent complications. Electronically Signed: By: Franki Cabot M.D. On: 07/03/2018 13:42     ELIGIBLE FOR AVAILABLE RESEARCH PROTOCOL: no   ASSESSMENT: 63 y.o. [place] woman status post right breast biopsy 07/03/2018 for a clinical T2 N0, stage IB invasive ductal carcinoma, grade 2, estrogen receptor strongly positive,  progesterone receptor weakly positive, with no HER-2 amplification and an MIB-1 of 5%.  (1) Oncotype to be obtained from the original biopsy: If patient will require chemotherapy, it will be given adjuvantly, however chemotherapy is not anticipated  (2) neoadjuvant anastrozole started 07/12/2018  (3) definitive surgery to follow  (4) adjuvant radiation as appropriate  (5) genetics testing   PLAN: I spent approximately 60 minutes face to face with Emily Hendricks with more than 50% of that time spent in counseling and coordination of care. Specifically we reviewed the biology of the patient's diagnosis and the specifics of her situation.  I asked Ms. Vitali if she knew what cancer was and she said she did not.  We discussed the fact that cancer is a part of the body that starts growing for no reason.  In her case it is obviously creating a mass.  If it is not treated the mass will become larger, eventually will break through the skin, and cause problems like pain bleeding and other symptoms.  In addition the cancer can jump to other parts of her body and eventually take her life.  We discussed the difference between local and systemic therapy. We also noted that in terms of sequencing of treatments, whether systemic therapy or surgery is done first does not affect the ultimate outcome.  In her case we feel starting with neoadjuvant antiestrogens will be helpful and that it will give [place] time to further assess the same and contralateral breast, hopefully move the tumor away from the pectoralis muscle, and also give time for genetics testing.  Next we went over the options for systemic therapy which are anti-estrogens, anti-HER-2 immunotherapy, and chemotherapy. Emily Hendricks does not meet criteria for anti-HER-2 immunotherapy. She is a good candidate for anti-estrogens.  The question of chemotherapy is more complicated. Chemotherapy is most effective in rapidly growing, aggressive tumors.  It is much less  effective in low-grade, slow growing cancers, like Emily Hendricks 's. For that reason we are going to request an Oncotype from the definitive surgical sample, as suggested by NCCN guidelines.  We anticipate that she will not require chemotherapy to have a good prognosis.  We then discussed antiestrogens and specifically anastrozole.  The patient has a good understanding of the possible toxicities side effects and complications of this agent.  I have placed the prescription in and I have let them know that if there is any cost issue they need to let us know.  Emily Hendricks has a good understanding of the overall plan. She agrees with it. She knows the goal of treatment in her case is cure. She will call with any problems that may develop before her next visit here, which will be in approximately 2 months.    Eiley Mcginnity, Emily Dad, MD  07/12/18 10:52 AM Medical Oncology and Hematology [facility] [address] [place], [place] [address] Tel. [phone]    Fax. [phone]   I, Jacqualyn Posey am acting as a Education administrator for Chauncey Cruel, MD.   I, Lurline Del MD, have reviewed the above documentation for accuracy and completeness, and I agree with the above.

## 2018-07-12 ENCOUNTER — Inpatient Hospital Stay: Payer: Medicaid Other | Attending: Oncology | Admitting: Oncology

## 2018-07-12 ENCOUNTER — Encounter: Payer: Self-pay | Admitting: Oncology

## 2018-07-12 ENCOUNTER — Inpatient Hospital Stay: Payer: Medicaid Other

## 2018-07-12 ENCOUNTER — Ambulatory Visit (HOSPITAL_BASED_OUTPATIENT_CLINIC_OR_DEPARTMENT_OTHER): Payer: Medicaid Other | Admitting: Genetic Counselor

## 2018-07-12 ENCOUNTER — Other Ambulatory Visit: Payer: Self-pay | Admitting: *Deleted

## 2018-07-12 ENCOUNTER — Ambulatory Visit: Payer: No Typology Code available for payment source | Attending: Radiation Oncology | Admitting: Radiation Oncology

## 2018-07-12 ENCOUNTER — Telehealth: Payer: Self-pay | Admitting: *Deleted

## 2018-07-12 ENCOUNTER — Encounter: Payer: Self-pay | Admitting: Genetic Counselor

## 2018-07-12 ENCOUNTER — Other Ambulatory Visit: Payer: Self-pay

## 2018-07-12 VITALS — BP 156/80 | HR 84 | Temp 98.1°F | Resp 18 | Ht 61.0 in | Wt 133.0 lb

## 2018-07-12 DIAGNOSIS — Z809 Family history of malignant neoplasm, unspecified: Secondary | ICD-10-CM

## 2018-07-12 DIAGNOSIS — Z803 Family history of malignant neoplasm of breast: Secondary | ICD-10-CM

## 2018-07-12 DIAGNOSIS — Z79899 Other long term (current) drug therapy: Secondary | ICD-10-CM | POA: Diagnosis not present

## 2018-07-12 DIAGNOSIS — I10 Essential (primary) hypertension: Secondary | ICD-10-CM | POA: Insufficient documentation

## 2018-07-12 DIAGNOSIS — C50211 Malignant neoplasm of upper-inner quadrant of right female breast: Secondary | ICD-10-CM

## 2018-07-12 DIAGNOSIS — Z79811 Long term (current) use of aromatase inhibitors: Secondary | ICD-10-CM | POA: Diagnosis not present

## 2018-07-12 DIAGNOSIS — Z170 Estrogen receptor positive status [ER+]: Principal | ICD-10-CM

## 2018-07-12 DIAGNOSIS — Z7183 Encounter for nonprocreative genetic counseling: Secondary | ICD-10-CM | POA: Diagnosis not present

## 2018-07-12 DIAGNOSIS — E119 Type 2 diabetes mellitus without complications: Secondary | ICD-10-CM | POA: Insufficient documentation

## 2018-07-12 DIAGNOSIS — Z7984 Long term (current) use of oral hypoglycemic drugs: Secondary | ICD-10-CM | POA: Insufficient documentation

## 2018-07-12 DIAGNOSIS — E785 Hyperlipidemia, unspecified: Secondary | ICD-10-CM | POA: Diagnosis not present

## 2018-07-12 LAB — CMP (CANCER CENTER ONLY)
ALT: 12 U/L (ref 0–44)
AST: 17 U/L (ref 15–41)
Albumin: 3.9 g/dL (ref 3.5–5.0)
Alkaline Phosphatase: 88 U/L (ref 38–126)
Anion gap: 9 (ref 5–15)
BUN: 12 mg/dL (ref 8–23)
CO2: 26 mmol/L (ref 22–32)
Calcium: 9.3 mg/dL (ref 8.9–10.3)
Chloride: 107 mmol/L (ref 98–111)
Creatinine: 0.83 mg/dL (ref 0.44–1.00)
GFR, Est AFR Am: >60 mL/min (ref >60)
GFR, Estimated: >60 mL/min (ref >60)
Glucose, Bld: 93 mg/dL (ref 70–99)
Potassium: 4.3 mmol/L (ref 3.5–5.1)
Sodium: 142 mmol/L (ref 135–145)
Total Bilirubin: 0.5 mg/dL (ref 0.3–1.2)
Total Protein: 7.3 g/dL (ref 6.5–8.1)

## 2018-07-12 LAB — CBC WITH DIFFERENTIAL (CANCER CENTER ONLY)
Abs Immature Granulocytes: 0.01 10*3/uL (ref 0.00–0.07)
Basophils Absolute: 0 10*3/uL (ref 0.0–0.1)
Basophils Relative: 1 %
Eosinophils Absolute: 0.2 10*3/uL (ref 0.0–0.5)
Eosinophils Relative: 5 %
HCT: 39.7 % (ref 36.0–46.0)
Hemoglobin: 11.9 g/dL — ABNORMAL LOW (ref 12.0–15.0)
Immature Granulocytes: 0 %
Lymphocytes Relative: 38 %
Lymphs Abs: 1.7 10*3/uL (ref 0.7–4.0)
MCH: 26.1 pg (ref 26.0–34.0)
MCHC: 30 g/dL (ref 30.0–36.0)
MCV: 87.1 fL (ref 80.0–100.0)
Monocytes Absolute: 0.3 10*3/uL (ref 0.1–1.0)
Monocytes Relative: 7 %
Neutro Abs: 2.2 10*3/uL (ref 1.7–7.7)
Neutrophils Relative %: 49 %
Platelet Count: 231 10*3/uL (ref 150–400)
RBC: 4.56 MIL/uL (ref 3.87–5.11)
RDW: 12.8 % (ref 11.5–15.5)
WBC Count: 4.5 10*3/uL (ref 4.0–10.5)
nRBC: 0 % (ref 0.0–0.2)

## 2018-07-12 MED ORDER — ANASTROZOLE 1 MG PO TABS: 1.0000 mg | ORAL_TABLET | Freq: Every day | ORAL | 4 refills | Status: DC

## 2018-07-12 NOTE — Progress Notes (Addendum)
REFERRING PROVIDER: Chauncey Cruel, MD [address] [place], [place] [address]  PRIMARY PROVIDER:  Gildardo Pounds, NP  PRIMARY REASON FOR VISIT:  1. Malignant neoplasm of upper-inner quadrant of right breast in female, estrogen receptor positive ([place])   2. Family history of cancer      HISTORY OF PRESENT ILLNESS:  I connected with Ms. Pifer on 07/12/2018 at 11:30 EDT by Webex video conference and verified that I am speaking with the correct person using two identifiers.   Patient location: breast clinic  Provider location: office  Ms. Zenner, a 63 y.o. female, was seen for a [facility] cancer genetics consultation, with a Hindi interpreter and her daughter on the phone, at the request of Dr. Jana Hakim due to a personal history of breast cancer and family history of cancer.  Ms. Penniman presents to clinic today to discuss the possibility of a hereditary predisposition to cancer, genetic testing, and to further clarify her future cancer risks, as well as potential cancer risks for family members.   In April 2020, at the age of 21, Ms. Benge was diagnosed with invasive ductal carcinoma of the right breast.     CANCER HISTORY:   No history exists.     RISK FACTORS:  Menarche was at age 27.  First live birth at age 54.  OCP use for approximately 0 years.  Ovaries intact: yes.  Hysterectomy: no.  Menopausal status: postmenopausal.  HRT use: 0 years. Colonoscopy: yes; 6 colon polyps. Mammogram within the last year: yes. Number of breast biopsies: 1. Up to date with pelvic exams: yes. Any excessive radiation exposure in the past: no  Past Medical History:  Diagnosis Date  . Diabetes mellitus without complication ([place])   . Family history of cancer   . Hyperlipidemia   . Hypertension     Past Surgical History:  Procedure Laterality Date  . NO PAST SURGERIES    . TUBAL LIGATION      Social History   Socioeconomic History  . Marital status: Married    Spouse  name: Not on file  . Number of children: 2  . Years of education: Not on file  . Highest education level: 12th grade  Occupational History  . Not on file  Social Needs  . Financial resource strain: Not on file  . Food insecurity:    Worry: Not on file    Inability: Not on file  . Transportation needs:    Medical: No    Non-medical: No  Tobacco Use  . Smoking status: Never Smoker  . Smokeless tobacco: Never Used  Substance and Sexual Activity  . Alcohol use: No  . Drug use: No  . Sexual activity: Never  Lifestyle  . Physical activity:    Days per week: Not on file    Minutes per session: Not on file  . Stress: Not on file  Relationships  . Social connections:    Talks on phone: Not on file    Gets together: Not on file    Attends religious service: Not on file    Active member of club or organization: Not on file    Attends meetings of clubs or organizations: Not on file    Relationship status: Not on file  Other Topics Concern  . Not on file  Social History Narrative  . Not on file     FAMILY HISTORY:  We obtained a detailed, 4-generation family history.  Significant diagnoses are listed below: Family History  Problem Relation Age of Onset  . Cancer Mother 23       unknown cancer, in her lower abdomen  . Cancer Sister 36       unknown cancer in her lower abdomen  . Heart disease Brother   . Cancer Niece        unknown form of cancer    The patient has two children who are cancer free.  She has six brothers and one sister.  Four brothers have died from heart problems.  Her sister had cancer at 25 and died at 40.  One brother had a daughter who had cancer and died young.  The patient's parents are both deceased.  The patient's mother had cancer in her lower abdomen at 47 and died at 25.  She had two brothers and two sisters who are cancer free.  There is no reported cancer history on the maternal side of the family.  The patient's father died young from  complications of surgery.  He had two brothers who are cancer free.  There is no reported family history of cancer on the paternal side.  Ms. Pippins is unaware of previous family history of genetic testing for hereditary cancer risks. Patient's maternal ancestors are of [place] descent, and paternal ancestors are of [place] descent. There is no reported Ashkenazi Jewish ancestry. There is no known consanguinity.   GENETIC COUNSELING ASSESSMENT: Ms. Connolly is a 63 y.o. female with a personal and family history of cancer which, two which were diagnosed under 82, is somewhat suggestive of a hereditary cancer syndrome and predisposition to cancer. We, therefore, discussed and recommended the following at today's visit.   DISCUSSION: We discussed that 5 - 10% of breast cancer is hereditary, with most cases associated with BRCA mutations.  There are other genes that can be associated with hereditary breast cancer syndromes.  Without knowing what kind if cancer her sister, mother and niece had it is hard to allow the family history point [place] in a direction.  Cancer in the lower abdomen could be colon cancer, uterine/ovarian/peritoneal cancer, or something else that we are not thinking of.  Early onset of cancer, meaning under the age of 24, is generally associated with meeting medical criteria for genetic testing. We discussed that testing is beneficial for several reasons including knowing how to follow individuals after completing their treatment, identifying whether potential treatment options such as PARP inhibitors would be beneficial, and understand if other family members could be at risk for cancer and allow them to undergo genetic testing.   We reviewed the characteristics, features and inheritance patterns of hereditary cancer syndromes. We also discussed genetic testing, including the appropriate family members to test, the process of testing, insurance coverage and turn-around-time for  results. We discussed the implications of a negative, positive and/or variant of uncertain significant result. We recommended Ms. Holtmeyer pursue genetic testing for the common hereditary gene panel. The Common Hereditary Gene Panel offered by [facility] includes sequencing and/or deletion duplication testing of the following 48 genes: APC, ATM, AXIN2, BARD1, BMPR1A, BRCA1, BRCA2, BRIP1, CDH1, CDK4, CDKN2A (p14ARF), CDKN2A (p16INK4a), CHEK2, CTNNA1, DICER1, EPCAM (Deletion/duplication testing only), GREM1 (promoter region deletion/duplication testing only), KIT, MEN1, MLH1, MSH2, MSH3, MSH6, MUTYH, NBN, NF1, NHTL1, PALB2, PDGFRA, PMS2, POLD1, POLE, PTEN, RAD50, RAD51C, RAD51D, RNF43, SDHB, SDHC, SDHD, SMAD4, SMARCA4. STK11, TP53, TSC1, TSC2, and VHL.  The following genes were evaluated for sequence changes only: SDHA and HOXB13 c.251G>A variant only.   Ms. Masri  personal and family history of cancer is suggestive of a hereditary cancer syndrome and in theory she could meet medical criteria based on early onset of disease.  However, without knowing the type of cancer in the family we are not able to suggest which type of testing she should definitively have.  Ms. Cavalieri does not have insurance, so we have applied for the Patient Assistance Program through [place] for complementary testing.  If this application is not approved, genetic testing can be performed for $250 out of pocket.  I discussed this with her daughter, Alto Denver, who voiced her understanding.    PLAN: After considering the risks, benefits, and limitations, Ms. Ballin provided informed consent to pursue genetic testing and the blood sample was sent to [facility] for analysis of the common hereditary cancer panel. Results should be available within approximately 2-3 weeks' time, at which point they will be disclosed by telephone to Ms. Lomanto, as will any additional recommendations warranted by these results. Ms. Nisley will receive a summary of her  genetic counseling visit and a copy of her results once available. This information will also be available in Epic.   Lastly, we encouraged Ms. Salman to remain in contact with cancer genetics annually so that we can continuously update the family history and inform her of any changes in cancer genetics and testing that may be of benefit for this family.   Ms. Linares questions were answered to her satisfaction today. Our contact information was provided should additional questions or concerns arise. Thank you for the referral and allowing [place] to share in the care of your patient.    P. Florene Glen, [place], [facility] Certified Genetic Counselor [place] [email or web address] phone: [phone]  The patient was seen for a total of 55 minutes in face-to-face genetic counseling.  This patient was discussed with Drs. Magrinat, Lindi Adie and/or Burr Medico who agrees with the above.    _______________________________________________________________________ For Office Staff:  Number of people involved in session: 3 Was an Intern/ student involved with case: no

## 2018-07-12 NOTE — Telephone Encounter (Signed)
Received order for oncotype testing on core bx. Requisition faxed to [facility] and [facility]

## 2018-07-13 ENCOUNTER — Other Ambulatory Visit: Payer: Self-pay | Admitting: *Deleted

## 2018-07-13 ENCOUNTER — Telehealth: Payer: Self-pay | Admitting: Oncology

## 2018-07-13 MED ORDER — ANASTROZOLE 1 MG PO TABS: 1.0000 mg | ORAL_TABLET | Freq: Every day | ORAL | 4 refills | Status: DC

## 2018-07-13 NOTE — Telephone Encounter (Signed)
Called regarding schedule °

## 2018-07-17 ENCOUNTER — Encounter: Payer: Self-pay | Admitting: General Practice

## 2018-07-17 NOTE — Progress Notes (Signed)
[place] Psychosocial Distress Screening Spiritual Care  Attempted to phone [place] with assistance from [facility] (ID# [id number]) to follow up after Breast Multidisciplinary Clinic to assess needs and introduce Support Center team/resources, reviewing distress screen per protocol.  The patient scored a [unspecified] on the Psychosocial Distress Thermometer which indicates [unspecified] distress.   ONCBCN DISTRESS SCREENING 07/17/2018  Screening Type Initial Screening  Referral to support programs Yes    Follow up needed: Yes.  Interpreter LVM with callback number, but given language needs I will certainly phone again.   [place], [place], [facility] Pager [phone] Voicemail [phone]

## 2018-07-18 ENCOUNTER — Encounter: Payer: Self-pay | Admitting: General Practice

## 2018-07-18 NOTE — Progress Notes (Signed)
[place] Spiritual Care Note  Assisted by [facility] [phone], attempted second [facility] f/u call to assess for needs and offer Support Center resources. Interpreter LVM. Will keep trying.   [place], [place], [facility] Pager [phone] Voicemail [phone]

## 2018-07-19 ENCOUNTER — Encounter: Payer: Self-pay | Admitting: General Practice

## 2018-07-19 ENCOUNTER — Telehealth: Payer: Self-pay

## 2018-07-19 NOTE — Progress Notes (Signed)
[place] Spiritual Care Note  Attempted to reach Ms Brunell by phone as f/u to [facility] with assistance of [facility] Interpreter ID# [phone]. Spoke in [place] with dtr Sweta, whose phone number is the one listed for pt. Per dtr, pt's personal cell is [phone].  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 --Ms Nasby is Hindu and makes friends with people of all backgrounds. She is vegetarian. 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  F/u plan: Alto Denver will call her mom to let her know to anticipate interpreter-assisted phone call from chaplain for spiritual/emotional support. I will also refer to [facility] Silversmith Cunningham/LCSW for questions related to Medicaid/insurance and financial resources.   [place], [place], [facility] Pager [phone] Voicemail [phone]

## 2018-07-19 NOTE — Progress Notes (Signed)
[place] CSW Progress Notes  At request of chaplain Lorrin Jackson, spoke w patient's daughter re financial and insurance needs.  Daughter relates that mother is a permanent resident of [place], has green card. Had full Medicaid in [place].  Has applied and been denied for full Medicaid in [place] now that she resides in this state.  Per record, she is a patient at [facility], has come to [facility] via [employer] and has [place] 100% discount.  Message sent to [place] asking them to call daughter and clarify if patient has been referred for Medicaid and whether [facility] discount will cover cancer treatment expenses completely.  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  Husband has multiple care needs, incontinent, needs help w ADLs.  To this point, patient has been able to assist, may need help.  Pointed daughter to resources at [employer] care provider ([place]), also sent staff message to RN CM and CSW at that facility alerting them of patients diagnosis and possible inability to continue to provide care to the extent she has been in the past.  Also sent daughter list of available financial resources - most likely option is Cancer Care as medical expenses may be minimal ([employer] could help w these) and [employer] (living expenses) requires that applicant have been employed at time of diagnosis.  Per daughter, between disability and retirement funds coming in the home, along [employer], family meets basic needs like rent, food, utilities; but are unable to afford additional expenses as funds are very limited.  Offered to assist w applications as appropriate.  Edwyna Shell, LCSW Clinical Social Worker Phone:  [phone]

## 2018-07-19 NOTE — Progress Notes (Signed)
[place] CSW Progress Note  Repsonse from [place], [employer] program.  States that patient needs to complete Medicaid application for [employer] program ASAP - states she has tried to contact patient without success.  Messaged Gabriel Cirri advising that patient is primary Hindi speaker and daughter Alto Denver handles many issues for patient.  Baruch Gouty Sweta's phone number, also left VM for Sweta w Sabrina's contact information and encouraged them to call.  Edwyna Shell, LCSW Clinical Social Worker Phone:  [phone]

## 2018-07-19 NOTE — Telephone Encounter (Signed)
Nutrition  RD working remotely.  Called and spoke with daughter Emily Hendricks regarding any nutrition concerns or questions following Breast Clinic on 4/22.   Daughter will look for nutrition packet in things that she was provided in clinic on 4/22.  Daughter's questions answered.    Contact information provided.    [name] B. Zenia Resides, [place], [place] Registered Dietitian [phone] (pager)

## 2018-07-19 NOTE — Progress Notes (Signed)
[place] Note  Made f/u call directly to Emily Hendricks with assistance of Emily Hendricks ID# [id number], who identified during call with Emily Hendricks and her daughter-in-law Emily Hendricks that Emily Hendricks was actually a more comfortable language fit. Emily Hendricks was translating from [place] to [place].) With pt's consent, we switched to [place] ID# [id number].  Introduced [place] as part of Patient and [employer], offering spiritual and emotional support. Emily Hendricks shared that her key supporters are daughter Emily Hendricks and daughter-in-law Emily Hendricks, and at this time she is keeping her dx private and hasn't told others. Normalized feelings, adding that chaplain would be happy to serve as an additional layer of private support if desired. Per pt, she feels good about her medical team and is coping well at this time, but plans to call as needed and has my direct dial number.   [place], [place], [facility] Pager [phone] Voicemail [phone]

## 2018-07-20 ENCOUNTER — Other Ambulatory Visit: Payer: Self-pay | Admitting: Oncology

## 2018-07-20 ENCOUNTER — Ambulatory Visit
Admission: RE | Admit: 2018-07-20 | Discharge: 2018-07-20 | Disposition: A | Discharge status: Home / Self Care | Payer: No Typology Code available for payment source | Source: Ambulatory Visit | Attending: General Surgery | Admitting: General Surgery

## 2018-07-20 ENCOUNTER — Other Ambulatory Visit: Payer: Self-pay

## 2018-07-20 ENCOUNTER — Encounter: Payer: Self-pay | Admitting: General Practice

## 2018-07-20 ENCOUNTER — Telehealth: Payer: Self-pay | Admitting: *Deleted

## 2018-07-20 DIAGNOSIS — C50211 Malignant neoplasm of upper-inner quadrant of right female breast: Secondary | ICD-10-CM

## 2018-07-20 DIAGNOSIS — Z170 Estrogen receptor positive status [ER+]: Principal | ICD-10-CM

## 2018-07-20 MED ORDER — GADOBUTROL 1 MMOL/ML IV SOLN
6.0000 mL | Freq: Once | INTRAVENOUS | Status: AC | PRN
  Administered 2018-07-20: 12:00:00 6 mL via INTRAVENOUS

## 2018-07-20 NOTE — Telephone Encounter (Signed)
Received oncotype results of 21/7%.  Left message for to check in from [facility].

## 2018-07-20 NOTE — Progress Notes (Signed)
[place] CSW Progress Notes  Spoke w Sweta, patient's daughter, provided contact information for Rolena Infante, [place] program - encouraged daughter to connect w Gabriel Cirri in order to complete application for Medicaid for patient.  Edwyna Shell, LCSW Clinical Social Worker Phone:  [phone]

## 2018-07-24 ENCOUNTER — Other Ambulatory Visit: Payer: Self-pay | Admitting: General Surgery

## 2018-07-24 ENCOUNTER — Encounter: Payer: Self-pay | Admitting: Genetic Counselor

## 2018-07-24 ENCOUNTER — Telehealth: Payer: Self-pay | Admitting: Genetic Counselor

## 2018-07-24 ENCOUNTER — Ambulatory Visit: Payer: Self-pay | Admitting: Genetic Counselor

## 2018-07-24 DIAGNOSIS — Z170 Estrogen receptor positive status [ER+]: Principal | ICD-10-CM

## 2018-07-24 DIAGNOSIS — Z1379 Encounter for other screening for genetic and chromosomal anomalies: Secondary | ICD-10-CM

## 2018-07-24 DIAGNOSIS — C50411 Malignant neoplasm of upper-outer quadrant of right female breast: Secondary | ICD-10-CM

## 2018-07-24 NOTE — Telephone Encounter (Signed)
Revealed negative genetic testing.  Discussed that we do not know why she has breast cancer or why there is cancer in the family. It could be due to a different gene that we are not testing, or maybe our current technology may not be able to pick something up.  It will be important for her to keep in contact with genetics to keep up with whether additional testing may be needed.  Revealed that there is a BRCA1 VUS identified.  This will not change her medical management.

## 2018-07-24 NOTE — Progress Notes (Addendum)
HPI:  Emily Hendricks was previously seen in the [facility] clinic due to a personal and family history of cancer and concerns regarding a hereditary predisposition to cancer. Please refer to our prior cancer genetics clinic note for more information regarding our discussion, assessment and recommendations, at the time. Emily Hendricks recent genetic test results were disclosed to her, as were recommendations warranted by these results. These results and recommendations are discussed in more detail below.  CANCER HISTORY:    Malignant neoplasm of upper-inner quadrant of right breast in female, estrogen receptor positive ([place])   07/06/2018 Initial Diagnosis    Malignant neoplasm of upper-inner quadrant of right breast in female, estrogen receptor positive ([place])    07/24/2018 Genetic Testing    Negative genetic testing.  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  The Common Hereditary Gene Panel offered by [facility] includes sequencing and/or deletion duplication testing of the following 48 genes: APC, ATM, AXIN2, BARD1, BMPR1A, BRCA1, BRCA2, BRIP1, CDH1, CDK4, CDKN2A (p14ARF), CDKN2A (p16INK4a), CHEK2, CTNNA1, DICER1, EPCAM (Deletion/duplication testing only), GREM1 (promoter region deletion/duplication testing only), KIT, MEN1, MLH1, MSH2, MSH3, MSH6, MUTYH, NBN, NF1, NHTL1, PALB2, PDGFRA, PMS2, POLD1, POLE, PTEN, RAD50, RAD51C, RAD51D, RNF43, SDHB, SDHC, SDHD, SMAD4, SMARCA4. STK11, TP53, TSC1, TSC2, and VHL.  The following genes were evaluated for sequence changes only: SDHA and HOXB13 c.251G>A variant only. The report date is Jul 24, 2018.     FAMILY HISTORY:  We obtained a detailed, 4-generation family history.  Significant diagnoses are listed below: Family History  Problem Relation Age of Onset   Cancer Mother 42       unknown cancer, in her lower abdomen   Cancer Sister 42       unknown cancer in her lower abdomen   Heart disease Brother    Cancer Niece        unknown form of cancer     The patient has two children who are cancer free.  She has six brothers and one sister.  Four brothers have died from heart problems.  Her sister had cancer at 85 and died at 14.  One brother had a daughter who had cancer and died young.  The patient's parents are both deceased.  The patient's mother had cancer in her lower abdomen at 48 and died at 88.  She had two brothers and two sisters who are cancer free.  There is no reported cancer history on the maternal side of the family.  The patient's father died young from complications of surgery.  He had two brothers who are cancer free.  There is no reported family history of cancer on the paternal side.  Emily Hendricks is unaware of previous family history of genetic testing for hereditary cancer risks. Patient's maternal ancestors are of [place] descent, and paternal ancestors are of [place] descent. There is no reported Ashkenazi Jewish ancestry. There is no known consanguinity.    GENETIC TEST RESULTS: Genetic testing reported out on Jul 24, 2018 through the common hereditary cancer panel found no pathogenic mutations. The Common Hereditary Gene Panel offered by [facility] includes sequencing and/or deletion duplication testing of the following 48 genes: APC, ATM, AXIN2, BARD1, BMPR1A, BRCA1, BRCA2, BRIP1, CDH1, CDK4, CDKN2A (p14ARF), CDKN2A (p16INK4a), CHEK2, CTNNA1, DICER1, EPCAM (Deletion/duplication testing only), GREM1 (promoter region deletion/duplication testing only), KIT, MEN1, MLH1, MSH2, MSH3, MSH6, MUTYH, NBN, NF1, NHTL1, PALB2, PDGFRA, PMS2, POLD1, POLE, PTEN, RAD50, RAD51C, RAD51D, RNF43, SDHB, SDHC, SDHD, SMAD4, SMARCA4. STK11, TP53, TSC1, TSC2, and VHL.  The following genes were evaluated for sequence changes only: SDHA and HOXB13 c.251G>A variant only. The test report has been scanned into EPIC and is located under the Molecular Pathology section of the Results Review tab.  A portion of the result report is included  below for reference.     We discussed with Emily Hendricks that because current genetic testing is not perfect, it is possible there may be a gene mutation in one of these genes that current testing cannot detect, but that chance is small.  We also discussed, that there could be another gene that has not yet been discovered, or that we have not yet tested, that is responsible for the cancer diagnoses in the family. It is also possible there is a hereditary cause for the cancer in the family that Emily Hendricks did not inherit and therefore was not identified in her testing.  Therefore, it is important to remain in touch with cancer genetics in the future so that we can continue to offer Emily Hendricks the most up to date genetic testing.   Genetic testing did identify a variant of uncertain significance (VUS) was identified in the BRCA1 gene called c.5306A>G.  At this time, it is unknown if this variant is associated with increased cancer risk or if this is a normal finding, but most variants such as this get reclassified to being inconsequential. It should not be used to make medical management decisions. With time, we suspect the lab will determine the significance of this variant, if any. If we do learn more about it, we will try to contact Emily Hendricks to discuss it further. However, it is important to stay in touch with [place] periodically and keep the address and phone number up to date.  UPDATE: BRCA1 c.5306A>G VUS was reclassified to Likely Benign as a result of re-review of the evidence in light of new variant interpretation guidelines and/or new information. The updated report date is April 23, 2019.  ADDITIONAL GENETIC TESTING: We discussed with Emily Hendricks that there are other genes that are associated with increased cancer risk that can be analyzed. Should Emily Hendricks wish to pursue additional genetic testing, we are happy to discuss and coordinate this testing, at any time.    CANCER SCREENING RECOMMENDATIONS: Ms.  Hendricks test result is considered negative (normal).  This means that we have not identified a hereditary cause for her personal and family history of cancer at this time. Most cancers happen by chance and this negative test suggests that her cancer may fall into this category.    While reassuring, this does not definitively rule out a hereditary predisposition to cancer. It is still possible that there could be genetic mutations that are undetectable by current technology. There could be genetic mutations in genes that have not been tested or identified to increase cancer risk.  Therefore, it is recommended she continue to follow the cancer management and screening guidelines provided by her oncology and primary healthcare provider.   An individual's cancer risk and medical management are not determined by genetic test results alone. Overall cancer risk assessment incorporates additional factors, including personal medical history, family history, and any available genetic information that may result in a personalized plan for cancer prevention and surveillance  RECOMMENDATIONS FOR FAMILY MEMBERS:  Individuals in this family might be at some increased risk of developing cancer, over the general population risk, simply due to the family history of cancer.  We recommended women in this family have a yearly  mammogram beginning at age 66, or 54 years younger than the earliest onset of cancer, an annual clinical breast exam, and perform monthly breast self-exams. Women in this family should also have a gynecological exam as recommended by their primary provider. All family members should have a colonoscopy by age 70.  FOLLOW-UP: Lastly, we discussed with Emily Hendricks that cancer genetics is a rapidly advancing field and it is possible that new genetic tests will be appropriate for her and/or her family members in the future. We encouraged her to remain in contact with cancer genetics on an annual basis so we can  update her personal and family histories and let her know of advances in cancer genetics that may benefit this family.   Our contact number was provided. Emily Hendricks questions were answered to her satisfaction, and she knows she is welcome to call us at anytime with additional questions or concerns.   Roma Kayser, MS, [facility] Certified Genetic Counselor [email or web address]

## 2018-07-27 ENCOUNTER — Encounter: Payer: Self-pay | Admitting: General Practice

## 2018-07-27 NOTE — Progress Notes (Signed)
[place] CSW Progress Notes  Call to daughter, Chrystine Frogge.  Confirms that she has been in touch w Gabriel Cirri from [facility] and has helped get Medicaid application started for patient.  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 RN CM at [facility] aware of family' need for support.  Edwyna Shell, LCSW Clinical Social Worker Phone:  [phone]

## 2018-07-31 ENCOUNTER — Encounter: Payer: Self-pay | Admitting: *Deleted

## 2018-08-02 ENCOUNTER — Other Ambulatory Visit: Payer: Self-pay | Admitting: Nurse Practitioner

## 2018-08-02 DIAGNOSIS — E785 Hyperlipidemia, unspecified: Secondary | ICD-10-CM

## 2018-08-04 ENCOUNTER — Encounter (HOSPITAL_COMMUNITY): Payer: Self-pay | Admitting: *Deleted

## 2018-08-07 ENCOUNTER — Other Ambulatory Visit: Payer: Self-pay | Admitting: *Deleted

## 2018-08-07 DIAGNOSIS — Z170 Estrogen receptor positive status [ER+]: Secondary | ICD-10-CM

## 2018-08-07 DIAGNOSIS — C50211 Malignant neoplasm of upper-inner quadrant of right female breast: Secondary | ICD-10-CM

## 2018-08-08 ENCOUNTER — Other Ambulatory Visit: Payer: Self-pay | Admitting: General Surgery

## 2018-08-08 DIAGNOSIS — Z170 Estrogen receptor positive status [ER+]: Secondary | ICD-10-CM

## 2018-08-08 DIAGNOSIS — C50411 Malignant neoplasm of upper-outer quadrant of right female breast: Secondary | ICD-10-CM

## 2018-08-15 ENCOUNTER — Encounter (HOSPITAL_COMMUNITY): Payer: Self-pay | Admitting: *Deleted

## 2018-08-24 ENCOUNTER — Other Ambulatory Visit: Payer: Self-pay

## 2018-08-24 ENCOUNTER — Encounter (HOSPITAL_BASED_OUTPATIENT_CLINIC_OR_DEPARTMENT_OTHER): Payer: Self-pay | Admitting: *Deleted

## 2018-08-28 ENCOUNTER — Other Ambulatory Visit (HOSPITAL_COMMUNITY): Admission: RE | Admit: 2018-08-28 | Payer: Medicaid Other | Source: Ambulatory Visit

## 2018-08-28 NOTE — H&P (Signed)
Emily Hendricks  Location: [facility] Surgery Patient #: [id number] DOB: [birth year] Undefined / Language: Undefined / Race: Refused to Report/Unreported Female   History of Present Illness The patient is a 63 year old female who presents with breast cancer. Patient is a 63 year old female who is referred for consultation by Geryl Rankins for a new diagnosis of right breast cancer. The patient presented in April 2020 with a palpable mass for 2 to 3 months. This is in the upper inner quadrant of her right breast. She has not had a mammogram before. When she underwent diagnostic imaging she was found to have a 2.7 cm tumor abutting the pectoralis muscle at 1230. Core needle biopsy was performed and showed a grade 2 invasive mammary carcinoma, ductal phenotype, ER PR positive, HER-2 negative, Ki-67 of 5%. She has no other breast findings. She denies breast pain.  Of note she does have family history of cancer in her mother and her sister. She is not sure exactly what type of cancer, but knows it was somewhere in the pelvis for both family members.  She had menarche at age 48, first child was age 36. G4P2.  dx mammogram bilateral 06/29/18  CLINICAL DATA: RIGHT breast lump for 2-3 months. This is patient's baseline mammogram.  EXAM: DIGITAL DIAGNOSTIC BILATERAL MAMMOGRAM WITH CAD AND TOMO  ULTRASOUND RIGHT BREAST  COMPARISON: None.  ACR Breast Density Category c: The breast tissue is heterogeneously dense, which may obscure small masses.  FINDINGS: There is a spiculated mass within the upper slightly inner RIGHT breast, at far posterior depth, measuring approximately 2.2 cm greatest dimension, corresponding to the area of clinical concern.  No dominant masses, suspicious calcifications or secondary signs of malignancy are identified elsewhere within the RIGHT breast.  No dominant masses, suspicious calcifications or secondary signs of malignancy are identified within  the LEFT breast.  Mammographic images were processed with CAD.  Targeted ultrasound is performed, showing an irregular hypoechoic mass in the RIGHT breast at the 12:30 o'clock axis, 8 cm from the nipple, measuring 2.7 x 1.7 x 1.9 cm. The mass abuts and may involve the pectoralis muscle.  RIGHT axilla was evaluated with ultrasound showing no enlarged or morphologically abnormal lymph nodes.  IMPRESSION: 1. Highly suspicious mass in the RIGHT breast at the 12:30 o'clock axis, 8 cm from the nipple, measuring 2.7 cm. The mass abuts and may involve the pectoralis muscle. Ultrasound-guided biopsy is recommended. 2. No evidence of malignancy within the LEFT breast.  RECOMMENDATION: 1. Ultrasound-guided biopsy of the RIGHT breast mass. 2. If biopsy results are positive for carcinoma, would recommend breast MRI to exclude underlying chest wall involvement.  Ultrasound-guided biopsy is scheduled for April 13th.  I have discussed the findings and recommendations with the patient. Results were also provided in writing at the conclusion of the visit. If applicable, a reminder letter will be sent to the patient regarding the next appointment.  BI-RADS CATEGORY 5: Highly suggestive of malignancy.   pathology 07/03/2018 Breast, right, needle core biopsy, 12:30 o'clock - INVASIVE MAMMARY CARCINOMA. - SEE COMMENT. Microscopic Comment The carcinoma appears grade II. Estrogen Receptor: 95%, POSITIVE, STRONG STAINING INTENSITY Progesterone Receptor: 10%, POSITIVE, WEAK STAINING INTENSITY Proliferation Marker Ki67: 5% Her 2+ negative An e-cadherin stain is positive, consistent with a ductal phenotype.  CMET, CBC essentially normal 07/12/2018   Problem List/Past Medical  DIABETES MELLITUS TYPE II, CONTROLLED (E11.9)  HYPERTENSION, UNSPECIFIED TYPE (I10)  HYPERCHOLESTEREMIA (E78.00)   Past Surgical History Tubal Ligation   Medication History  Medications Reconciled    Review  of Systems All other systems negative  Note: occasional headache   Vitals  Weight: 133 lb Height: 61in Body Surface Area: 1.59 m Body Mass Index: 25.13 kg/m  Temp.: 98.83F  Pulse: 83 (Regular)  Resp.: 18 (Unlabored)  BP: 156/80 (Sitting, Left Arm, Standard)       Physical Exam General Mental Status-Alert. General Appearance-Consistent with stated age. Hydration-Well hydrated. Voice-Normal.  Head and Neck Head-normocephalic, atraumatic with no lesions or palpable masses. Trachea-midline. Thyroid Gland Characteristics - normal size and consistency.  Eye Eyeball - Bilateral-Extraocular movements intact. Sclera/Conjunctiva - Bilateral-No scleral icterus.  Chest and Lung Exam Chest and lung exam reveals -quiet, even and easy respiratory effort with no use of accessory muscles and on auscultation, normal breath sounds, no adventitious sounds and normal vocal resonance. Inspection Chest Wall - Normal. Back - normal.  Breast Note: Right breast: palpable mass high in upper inner quadrant. 2-3 cm. mobile. deforms contour of breast. no nipple retraction or skin dimpling. No nipple discharge. no LAD. both breasts otherwise symmetric wtih mild ptosis. heterogeneously dense. left breast- sl dense, no other findings.   Cardiovascular Cardiovascular examination reveals -normal heart sounds, regular rate and rhythm with no murmurs and normal pedal pulses bilaterally.  Abdomen Inspection Inspection of the abdomen reveals - No Hernias. Palpation/Percussion Palpation and Percussion of the abdomen reveal - Soft, Non Tender, No Rebound tenderness, No Rigidity (guarding) and No hepatosplenomegaly. Auscultation Auscultation of the abdomen reveals - Bowel sounds normal.  Neurologic Neurologic evaluation reveals -alert and oriented x 3 with no impairment of recent or remote memory. Mental Status-Normal.  Musculoskeletal Global Assessment  -Note: no gross deformities.  Normal Exam - Left-Upper Extremity Strength Normal and Lower Extremity Strength Normal. Normal Exam - Right-Upper Extremity Strength Normal and Lower Extremity Strength Normal.  Lymphatic Head & Neck  General Head & Neck Lymphatics: Bilateral - Description - Normal. Axillary  General Axillary Region: Bilateral - Description - Normal. Tenderness - Non Tender. Femoral & Inguinal  Generalized Femoral & Inguinal Lymphatics: Bilateral - Description - No Generalized lymphadenopathy.    Assessment & Plan  MALIGNANT NEOPLASM OF UPPER-INNER QUADRANT OF RIGHT BREAST IN FEMALE, ESTROGEN RECEPTOR POSITIVE (C50.211) Impression: Patient has a new diagnosis of clinical T2N0 right breast cancer. Due to her family history, we will send her for genetic testing. This will likely be able to be done today. We will also obtain an MRI due to the concern for potential invasion of her pectoralis muscle. Additionally, she does have relatively dense breasts and has family cancer history. An Oncotype will be sent on her core needle biopsy to make sure that she would not benefit from chemotherapy. She will be started on antiestrogen therapy immediately while we are waiting for these other test to come back.  Once we make sure she does not have a genetic predisposition for breast cancer or other concerning areas in her breast that are malignant, we will make a final surgical plan. Hopefully we can treat her with a lumpectomy and sentinel node biopsy.  I will see her back following this. This may be a video visit depending on what is going on with the COVID-19 epidemic.  Her daughter was on the phone throughout the visit. The patient was seen with a Hindi interpreter present.  60 min spent in evaluation, examination, counseling, and coordination of care. >50% spent in counseling. FAMILY HISTORY OF CANCER IN MOTHER (Z80.8) Impression: genetic testing. FAMILY HISTORY OF CANCER IN  SISTER (  Z80.8) Impression: see above.

## 2018-08-29 ENCOUNTER — Other Ambulatory Visit: Payer: Self-pay

## 2018-08-29 ENCOUNTER — Encounter (HOSPITAL_BASED_OUTPATIENT_CLINIC_OR_DEPARTMENT_OTHER)
Admission: RE | Admit: 2018-08-29 | Discharge: 2018-08-29 | Disposition: A | Discharge status: Home / Self Care | Payer: Medicaid Other | Source: Ambulatory Visit | Attending: General Surgery | Admitting: General Surgery

## 2018-08-29 ENCOUNTER — Other Ambulatory Visit (HOSPITAL_COMMUNITY)
Admission: RE | Admit: 2018-08-29 | Discharge: 2018-08-29 | Disposition: A | Discharge status: Home / Self Care | Payer: Medicaid Other | Source: Ambulatory Visit | Attending: General Surgery | Admitting: General Surgery

## 2018-08-29 DIAGNOSIS — Z1159 Encounter for screening for other viral diseases: Secondary | ICD-10-CM | POA: Diagnosis not present

## 2018-08-29 DIAGNOSIS — Z01818 Encounter for other preprocedural examination: Secondary | ICD-10-CM | POA: Insufficient documentation

## 2018-08-29 LAB — BASIC METABOLIC PANEL
Anion gap: 10 (ref 5–15)
BUN: 12 mg/dL (ref 8–23)
CO2: 24 mmol/L (ref 22–32)
Calcium: 9.4 mg/dL (ref 8.9–10.3)
Chloride: 106 mmol/L (ref 98–111)
Creatinine, Ser: 0.65 mg/dL (ref 0.44–1.00)
GFR calc Af Amer: >60 mL/min (ref >60)
GFR calc non Af Amer: >60 mL/min (ref >60)
Glucose, Bld: 95 mg/dL (ref 70–99)
Potassium: 4.3 mmol/L (ref 3.5–5.1)
Sodium: 140 mmol/L (ref 135–145)

## 2018-08-29 NOTE — Progress Notes (Signed)
Daughter in with pt to interpret for her to get labs, EKg and G2 drink. Asked Daughter if pt had gone for Covid test , she said "No!" Explained to the daughter that Emily Hendricks needed to go today for [employer] at [employer]. Told her she has to have it done before she can have surgery. The daughter said she would take her over now. Pt given G2 drink and instructed with daughter interpreting that she needs to drink by 0600 day of surgery with teach back method.

## 2018-08-30 ENCOUNTER — Ambulatory Visit
Admission: RE | Admit: 2018-08-30 | Discharge: 2018-08-30 | Disposition: A | Discharge status: Home / Self Care | Payer: Medicaid Other | Source: Ambulatory Visit | Attending: General Surgery | Admitting: General Surgery

## 2018-08-30 ENCOUNTER — Other Ambulatory Visit: Payer: Self-pay | Admitting: General Surgery

## 2018-08-30 DIAGNOSIS — C50411 Malignant neoplasm of upper-outer quadrant of right female breast: Secondary | ICD-10-CM

## 2018-08-30 DIAGNOSIS — Z170 Estrogen receptor positive status [ER+]: Secondary | ICD-10-CM

## 2018-08-30 LAB — NOVEL CORONAVIRUS, NAA (HOSP ORDER, SEND-OUT TO REF LAB; TAT 18-24 HRS): SARS-CoV-2, NAA: NOT DETECTED

## 2018-08-31 ENCOUNTER — Encounter (HOSPITAL_BASED_OUTPATIENT_CLINIC_OR_DEPARTMENT_OTHER)
Admission: RE | Disposition: A | Discharge status: Home / Self Care | Payer: Self-pay | Source: Home / Self Care | Attending: General Surgery

## 2018-08-31 ENCOUNTER — Ambulatory Visit (HOSPITAL_COMMUNITY): Payer: Self-pay

## 2018-08-31 ENCOUNTER — Ambulatory Visit (HOSPITAL_BASED_OUTPATIENT_CLINIC_OR_DEPARTMENT_OTHER): Payer: Medicaid Other | Admitting: Anesthesiology

## 2018-08-31 ENCOUNTER — Other Ambulatory Visit: Payer: Self-pay

## 2018-08-31 ENCOUNTER — Ambulatory Visit (HOSPITAL_COMMUNITY)
Admission: RE | Admit: 2018-08-31 | Discharge: 2018-08-31 | Disposition: A | Discharge status: Home / Self Care | Payer: Medicaid Other | Source: Ambulatory Visit | Attending: General Surgery | Admitting: General Surgery

## 2018-08-31 ENCOUNTER — Ambulatory Visit (HOSPITAL_BASED_OUTPATIENT_CLINIC_OR_DEPARTMENT_OTHER)
Admission: RE | Admit: 2018-08-31 | Discharge: 2018-08-31 | Disposition: A | Discharge status: Home / Self Care | Payer: Medicaid Other | Attending: General Surgery | Admitting: General Surgery

## 2018-08-31 ENCOUNTER — Ambulatory Visit
Admission: RE | Admit: 2018-08-31 | Discharge: 2018-08-31 | Disposition: A | Discharge status: Home / Self Care | Payer: Medicaid Other | Source: Ambulatory Visit | Attending: General Surgery | Admitting: General Surgery

## 2018-08-31 ENCOUNTER — Encounter (HOSPITAL_BASED_OUTPATIENT_CLINIC_OR_DEPARTMENT_OTHER): Payer: Self-pay | Admitting: *Deleted

## 2018-08-31 DIAGNOSIS — Z170 Estrogen receptor positive status [ER+]: Secondary | ICD-10-CM | POA: Insufficient documentation

## 2018-08-31 DIAGNOSIS — Z809 Family history of malignant neoplasm, unspecified: Secondary | ICD-10-CM | POA: Insufficient documentation

## 2018-08-31 DIAGNOSIS — C773 Secondary and unspecified malignant neoplasm of axilla and upper limb lymph nodes: Secondary | ICD-10-CM | POA: Insufficient documentation

## 2018-08-31 DIAGNOSIS — C50411 Malignant neoplasm of upper-outer quadrant of right female breast: Secondary | ICD-10-CM

## 2018-08-31 DIAGNOSIS — C50211 Malignant neoplasm of upper-inner quadrant of right female breast: Secondary | ICD-10-CM | POA: Diagnosis not present

## 2018-08-31 DIAGNOSIS — I10 Essential (primary) hypertension: Secondary | ICD-10-CM | POA: Diagnosis not present

## 2018-08-31 DIAGNOSIS — Z7984 Long term (current) use of oral hypoglycemic drugs: Secondary | ICD-10-CM | POA: Insufficient documentation

## 2018-08-31 DIAGNOSIS — E119 Type 2 diabetes mellitus without complications: Secondary | ICD-10-CM | POA: Diagnosis not present

## 2018-08-31 HISTORY — PX: BREAST LUMPECTOMY WITH RADIOACTIVE SEED AND SENTINEL LYMPH NODE BIOPSY: SHX6550

## 2018-08-31 LAB — GLUCOSE, CAPILLARY
Glucose-Capillary: 101 mg/dL — ABNORMAL HIGH (ref 70–99)
Glucose-Capillary: 105 mg/dL — ABNORMAL HIGH (ref 70–99)

## 2018-08-31 SURGERY — BREAST LUMPECTOMY WITH RADIOACTIVE SEED AND SENTINEL LYMPH NODE BIOPSY
Anesthesia: General | Site: Breast | Laterality: Right

## 2018-08-31 MED ORDER — DEXAMETHASONE SODIUM PHOSPHATE 4 MG/ML IJ SOLN
INTRAMUSCULAR | Status: DC | PRN
  Administered 2018-08-31: 5 mg via INTRAVENOUS

## 2018-08-31 MED ORDER — SCOPOLAMINE 1 MG/3DAYS TD PT72: 1.0000 | MEDICATED_PATCH | Freq: Once | TRANSDERMAL | Status: DC | PRN

## 2018-08-31 MED ORDER — OXYCODONE HCL 5 MG PO TABS: 2.5000 mg | ORAL_TABLET | Freq: Four times a day (QID) | ORAL | 0 refills | Status: DC | PRN

## 2018-08-31 MED ORDER — GABAPENTIN 100 MG PO CAPS
200.0000 mg | ORAL_CAPSULE | ORAL | Status: AC
  Administered 2018-08-31: 200 mg via ORAL

## 2018-08-31 MED ORDER — ONDANSETRON HCL 4 MG/2ML IJ SOLN
INTRAMUSCULAR | Status: DC | PRN
  Administered 2018-08-31: 4 mg via INTRAVENOUS

## 2018-08-31 MED ORDER — OXYCODONE HCL 5 MG PO TABS: 5.0000 mg | ORAL_TABLET | Freq: Once | ORAL | Status: DC | PRN

## 2018-08-31 MED ORDER — GLYCOPYRROLATE 0.2 MG/ML IJ SOLN
INTRAMUSCULAR | Status: DC | PRN
  Administered 2018-08-31: 0.2 mg via INTRAVENOUS

## 2018-08-31 MED ORDER — GABAPENTIN 100 MG PO CAPS
ORAL_CAPSULE | ORAL | Status: AC
  Filled 2018-08-31: qty 2

## 2018-08-31 MED ORDER — FENTANYL CITRATE (PF) 100 MCG/2ML IJ SOLN
50.0000 ug | INTRAMUSCULAR | Status: AC | PRN
  Administered 2018-08-31 (×2): 50 ug via INTRAVENOUS
  Administered 2018-08-31: 25 ug via INTRAVENOUS

## 2018-08-31 MED ORDER — PHENYLEPHRINE HCL (PRESSORS) 10 MG/ML IV SOLN
INTRAVENOUS | Status: DC | PRN
  Administered 2018-08-31: 120 ug via INTRAVENOUS
  Administered 2018-08-31: 80 ug via INTRAVENOUS
  Administered 2018-08-31 (×2): 120 ug via INTRAVENOUS
  Administered 2018-08-31: 40 ug via INTRAVENOUS
  Administered 2018-08-31: 80 ug via INTRAVENOUS

## 2018-08-31 MED ORDER — MIDAZOLAM HCL 2 MG/2ML IJ SOLN
INTRAMUSCULAR | Status: AC
  Filled 2018-08-31: qty 2

## 2018-08-31 MED ORDER — LIDOCAINE 2% (20 MG/ML) 5 ML SYRINGE
INTRAMUSCULAR | Status: DC | PRN
  Administered 2018-08-31: 80 mg via INTRAVENOUS

## 2018-08-31 MED ORDER — CEFAZOLIN SODIUM-DEXTROSE 2-4 GM/100ML-% IV SOLN: 2.0000 g | INTRAVENOUS | Status: DC

## 2018-08-31 MED ORDER — FENTANYL CITRATE (PF) 100 MCG/2ML IJ SOLN
INTRAMUSCULAR | Status: AC
  Filled 2018-08-31: qty 2

## 2018-08-31 MED ORDER — MIDAZOLAM HCL 2 MG/2ML IJ SOLN
1.0000 mg | INTRAMUSCULAR | Status: DC | PRN
  Administered 2018-08-31 (×2): 2 mg via INTRAVENOUS

## 2018-08-31 MED ORDER — CEFAZOLIN SODIUM-DEXTROSE 2-4 GM/100ML-% IV SOLN
INTRAVENOUS | Status: AC
  Filled 2018-08-31: qty 100

## 2018-08-31 MED ORDER — PROPOFOL 500 MG/50ML IV EMUL
INTRAVENOUS | Status: AC
  Filled 2018-08-31: qty 50

## 2018-08-31 MED ORDER — OXYCODONE HCL 5 MG/5ML PO SOLN: 5.0000 mg | Freq: Once | ORAL | Status: DC | PRN

## 2018-08-31 MED ORDER — ACETAMINOPHEN 500 MG PO TABS
1000.0000 mg | ORAL_TABLET | ORAL | Status: AC
  Administered 2018-08-31: 1000 mg via ORAL

## 2018-08-31 MED ORDER — ACETAMINOPHEN 500 MG PO TABS
ORAL_TABLET | ORAL | Status: AC
  Filled 2018-08-31: qty 2

## 2018-08-31 MED ORDER — TECHNETIUM TC 99M SULFUR COLLOID FILTERED
1.0000 | Freq: Once | INTRAVENOUS | Status: AC | PRN
  Administered 2018-08-31: 1 via INTRADERMAL

## 2018-08-31 MED ORDER — FENTANYL CITRATE (PF) 100 MCG/2ML IJ SOLN: 25.0000 ug | INTRAMUSCULAR | Status: DC | PRN

## 2018-08-31 MED ORDER — ROPIVACAINE HCL 5 MG/ML IJ SOLN
INTRAMUSCULAR | Status: DC | PRN
  Administered 2018-08-31: 30 mL via PERINEURAL

## 2018-08-31 MED ORDER — CHLORHEXIDINE GLUCONATE CLOTH 2 % EX PADS: 6.0000 | MEDICATED_PAD | Freq: Once | CUTANEOUS | Status: DC

## 2018-08-31 MED ORDER — LACTATED RINGERS IV SOLN
INTRAVENOUS | Status: DC
  Administered 2018-08-31 (×3): via INTRAVENOUS

## 2018-08-31 MED ORDER — LIDOCAINE-EPINEPHRINE (PF) 1 %-1:200000 IJ SOLN
INTRAMUSCULAR | Status: DC | PRN
  Administered 2018-08-31: 10 mL via INTRAMUSCULAR

## 2018-08-31 MED ORDER — PROPOFOL 10 MG/ML IV BOLUS
INTRAVENOUS | Status: DC | PRN
  Administered 2018-08-31: 150 mg via INTRAVENOUS

## 2018-08-31 MED ORDER — ONDANSETRON HCL 4 MG/2ML IJ SOLN: 4.0000 mg | Freq: Once | INTRAMUSCULAR | Status: DC | PRN

## 2018-08-31 SURGICAL SUPPLY — 65 items
BINDER BREAST LRG (GAUZE/BANDAGES/DRESSINGS) IMPLANT
BINDER BREAST MEDIUM (GAUZE/BANDAGES/DRESSINGS) ×3 IMPLANT
BINDER BREAST XLRG (GAUZE/BANDAGES/DRESSINGS) IMPLANT
BINDER BREAST XXLRG (GAUZE/BANDAGES/DRESSINGS) IMPLANT
BLADE SURG 10 STRL SS (BLADE) ×3 IMPLANT
BLADE SURG 15 STRL LF DISP TIS (BLADE) ×1 IMPLANT
BLADE SURG 15 STRL SS (BLADE) ×2
BNDG COHESIVE 4X5 TAN STRL (GAUZE/BANDAGES/DRESSINGS) ×3 IMPLANT
CANISTER SUC SOCK COL 7IN (MISCELLANEOUS) IMPLANT
CANISTER SUCT 1200ML W/VALVE (MISCELLANEOUS) ×3 IMPLANT
CHLORAPREP W/TINT 26 (MISCELLANEOUS) ×3 IMPLANT
CLIP VESOCCLUDE LG 6/CT (CLIP) ×3 IMPLANT
CLIP VESOCCLUDE MED 6/CT (CLIP) ×6 IMPLANT
CLOSURE WOUND 1/2 X4 (GAUZE/BANDAGES/DRESSINGS) ×1
COVER MAYO STAND REUSABLE (DRAPES) ×3 IMPLANT
COVER PROBE W GEL 5X96 (DRAPES) ×3 IMPLANT
DECANTER SPIKE VIAL GLASS SM (MISCELLANEOUS) IMPLANT
DERMABOND ADVANCED (GAUZE/BANDAGES/DRESSINGS) ×2
DERMABOND ADVANCED .7 DNX12 (GAUZE/BANDAGES/DRESSINGS) ×1 IMPLANT
DRAPE UTILITY XL STRL (DRAPES) ×3 IMPLANT
DRSG PAD ABDOMINAL 8X10 ST (GAUZE/BANDAGES/DRESSINGS) ×3 IMPLANT
ELECT COATED BLADE 2.86 ST (ELECTRODE) ×3 IMPLANT
ELECT REM PT RETURN 9FT ADLT (ELECTROSURGICAL) ×3
ELECTRODE REM PT RTRN 9FT ADLT (ELECTROSURGICAL) ×1 IMPLANT
GAUZE SPONGE 4X4 12PLY STRL LF (GAUZE/BANDAGES/DRESSINGS) ×3 IMPLANT
GLOVE BIO SURGEON STRL SZ 6 (GLOVE) ×3 IMPLANT
GLOVE BIO SURGEON STRL SZ 6.5 (GLOVE) ×2 IMPLANT
GLOVE BIO SURGEONS STRL SZ 6.5 (GLOVE) ×1
GLOVE BIOGEL PI IND STRL 6.5 (GLOVE) ×1 IMPLANT
GLOVE BIOGEL PI IND STRL 7.0 (GLOVE) ×1 IMPLANT
GLOVE BIOGEL PI INDICATOR 6.5 (GLOVE) ×2
GLOVE BIOGEL PI INDICATOR 7.0 (GLOVE) ×2
GOWN STRL REUS W/ TWL LRG LVL3 (GOWN DISPOSABLE) ×1 IMPLANT
GOWN STRL REUS W/TWL 2XL LVL3 (GOWN DISPOSABLE) ×3 IMPLANT
GOWN STRL REUS W/TWL LRG LVL3 (GOWN DISPOSABLE) ×2
KIT MARKER MARGIN INK (KITS) ×3 IMPLANT
LIGHT WAVEGUIDE WIDE FLAT (MISCELLANEOUS) ×3 IMPLANT
NDL SAFETY ECLIPSE 18X1.5 (NEEDLE) ×1 IMPLANT
NEEDLE HYPO 18GX1.5 SHARP (NEEDLE) ×2
NEEDLE HYPO 25X1 1.5 SAFETY (NEEDLE) ×3 IMPLANT
NS IRRIG 1000ML POUR BTL (IV SOLUTION) ×3 IMPLANT
PACK BASIN DAY SURGERY FS (CUSTOM PROCEDURE TRAY) ×3 IMPLANT
PACK UNIVERSAL I (CUSTOM PROCEDURE TRAY) ×3 IMPLANT
PENCIL BUTTON HOLSTER BLD 10FT (ELECTRODE) ×3 IMPLANT
SLEEVE SCD COMPRESS KNEE MED (MISCELLANEOUS) ×3 IMPLANT
SPONGE LAP 18X18 RF (DISPOSABLE) ×6 IMPLANT
STAPLER VISISTAT 35W (STAPLE) IMPLANT
STOCKINETTE IMPERVIOUS LG (DRAPES) ×3 IMPLANT
STRIP CLOSURE SKIN 1/2X4 (GAUZE/BANDAGES/DRESSINGS) ×2 IMPLANT
SUT ETHILON 2 0 FS 18 (SUTURE) IMPLANT
SUT MNCRL AB 4-0 PS2 18 (SUTURE) ×9 IMPLANT
SUT MON AB 5-0 PS2 18 (SUTURE) IMPLANT
SUT SILK 2 0 SH (SUTURE) IMPLANT
SUT VIC AB 2-0 SH 27 (SUTURE) ×2
SUT VIC AB 2-0 SH 27XBRD (SUTURE) ×1 IMPLANT
SUT VIC AB 3-0 SH 27 (SUTURE) ×2
SUT VIC AB 3-0 SH 27X BRD (SUTURE) ×1 IMPLANT
SUT VICRYL 3-0 CR8 SH (SUTURE) ×3 IMPLANT
SYR BULB 3OZ (MISCELLANEOUS) ×3 IMPLANT
SYR CONTROL 10ML LL (SYRINGE) ×3 IMPLANT
TOWEL GREEN STERILE FF (TOWEL DISPOSABLE) ×3 IMPLANT
TRAY FAXITRON CT DISP (TRAY / TRAY PROCEDURE) ×3 IMPLANT
TUBE CONNECTING 20'X1/4 (TUBING) ×1
TUBE CONNECTING 20X1/4 (TUBING) ×2 IMPLANT
YANKAUER SUCT BULB TIP NO VENT (SUCTIONS) ×3 IMPLANT

## 2018-08-31 NOTE — Anesthesia Preprocedure Evaluation (Addendum)
Anesthesia Evaluation  Patient identified by MRN, date of birth, ID band Patient awake    Reviewed: Allergy & Precautions, NPO status , Patient's Chart, lab work & pertinent test results  History of Anesthesia Complications Negative for: history of anesthetic complications  Airway Mallampati: II  TM Distance: >3 FB Neck ROM: Full    Dental  (+) Edentulous Upper, Edentulous Lower   Pulmonary neg pulmonary ROS,    Pulmonary exam normal        Cardiovascular hypertension, Pt. on medications Normal cardiovascular exam     Neuro/Psych negative neurological ROS  negative psych ROS   GI/Hepatic negative GI ROS, Neg liver ROS,   Endo/Other  diabetes, Type 2, Oral Hypoglycemic Agents  Renal/GU negative Renal ROS  negative genitourinary   Musculoskeletal negative musculoskeletal ROS (+)   Abdominal   Peds  Hematology negative hematology ROS (+)   Anesthesia Other Findings   Reproductive/Obstetrics                            Anesthesia Physical Anesthesia Plan  ASA: II  Anesthesia Plan: General   Post-op Pain Management: GA combined w/ Regional for post-op pain   Induction: Intravenous  PONV Risk Score and Plan: 3 and Ondansetron, Dexamethasone, Midazolam and Treatment may vary due to age or medical condition  Airway Management Planned: LMA  Additional Equipment: None  Intra-op Plan:   Post-operative Plan: Extubation in OR  Informed Consent: I have reviewed the patients History and Physical, chart, labs and discussed the procedure including the risks, benefits and alternatives for the proposed anesthesia with the patient or authorized representative who has indicated his/her understanding and acceptance.     Dental advisory given  Plan Discussed with:   Anesthesia Plan Comments:        Anesthesia Quick Evaluation

## 2018-08-31 NOTE — Anesthesia Procedure Notes (Signed)
Anesthesia Regional Block: Pectoralis block   Pre-Anesthetic Checklist: ,, timeout performed, Correct Patient, Correct Site, Correct Laterality, Correct Procedure, Correct Position, site marked, Risks and benefits discussed,  Surgical consent,  Pre-op evaluation,  At surgeon's request and post-op pain management  Laterality: Right  Prep: chloraprep       Needles:  Injection technique: Single-shot  Needle Type: Echogenic Stimulator Needle     Needle Length: 9cm  Needle Gauge: 21     Additional Needles:   Procedures:,,,, ultrasound used (permanent image in chart),,,,  Narrative:  Start time: 08/31/2018 10:13 AM End time: 08/31/2018 10:17 AM Injection made incrementally with aspirations every 5 mL.  Performed by: Personally  Anesthesiologist: Lidia Collum, MD  Additional Notes: Monitors applied. Injection made in 5cc increments. No resistance to injection. Good needle visualization. Patient tolerated procedure well.

## 2018-08-31 NOTE — Op Note (Signed)
Right Breast Radioactive seed localized lumpectomy and sentinel lymph node biopsy  Indications: This patient presents with history of right breast cancer, cT2N0, upper inner quadrant, G2, +/+/-  Pre-operative Diagnosis: right breast cancer  Post-operative Diagnosis: Same  Surgeon: Stark Klein   Anesthesia: General endotracheal anesthesia  ASA Class: 2  Procedure Details  The patient was seen in the Holding Room. The risks, benefits, complications, treatment options, and expected outcomes were discussed with the patient. The possibilities of bleeding, infection, the need for additional procedures, failure to diagnose a condition, and creating a complication requiring transfusion or operation were discussed with the patient. The patient concurred with the proposed plan, giving informed consent.  The site of surgery properly noted/marked. The patient was taken to Operating Room # 8, identified, and the procedure verified as right Breast seed localized Lumpectomy with sentinel lymph node biopsy. A Time Out was held and the above information confirmed.    The right arm, breast, and chest were prepped and draped in standard fashion. The lumpectomy was performed by creating a curvilinear transverse incision near the previously placed radioactive seed.  Dissection was carried down to around the point of maximum signal intensity. The cautery was used to perform the dissection.  Hemostasis was achieved with cautery. The edges of the cavity were marked with large clips, with one each medial, lateral, inferior and superior, and two clips posteriorly.   The specimen was inked with the margin marker paint kit.    Specimen radiography confirmed inclusion of the mammographic lesion, the clip, and the seed.   The background signal in the breast was zero.  The wound was irrigated and closed with 3-0 vicryl in layers and 4-0 monocryl subcuticular suture.    Using a hand-held gamma probe, right axillary sentinel  nodes were identified transcutaneously.  An oblique incision was created below the axillary hairline.  Dissection was carried through the clavipectoral fascia.  Three deep level two axillary sentinel nodes were removed.  Counts per second were 497, 79, 120.    The background count was 19 cps.  The wound was irrigated.  Hemostasis was achieved with cautery.  The axillary incision was closed with a 3-0 vicryl deep dermal interrupted sutures and a 4-0 monocryl subcuticular closure.    Sterile dressings were applied. At the end of the operation, all sponge, instrument, and needle counts were correct.  Findings: grossly clear surgical margins and no adenopathy, posterior margin is pectoralis, anterior margin is skin.    Estimated Blood Loss:  min         Specimens: right breast lumpectomy with seed and three right axillary sentinel lymph nodes.             Complications:  None; patient tolerated the procedure well.         Disposition: PACU - hemodynamically stable.         Condition: stable

## 2018-08-31 NOTE — Interval H&P Note (Signed)
History and Physical Interval Note:  08/31/2018 9:35 AM  Emily Hendricks  has presented today for surgery, with the diagnosis of RIGHT BREAST CANCER.  The various methods of treatment have been discussed with the patient and family. After consideration of risks, benefits and other options for treatment, the patient has consented to  Procedure(s): RIGHT BREAST LUMPECTOMY WITH RADIOACTIVE SEED AND SENTINEL LYMPH NODE BIOPSY (Right) as a surgical intervention.  The patient's history has been reviewed, patient examined, no change in status, stable for surgery.  I have reviewed the patient's chart and labs.  Questions were answered to the patient's satisfaction.     Stark Klein

## 2018-08-31 NOTE — Anesthesia Procedure Notes (Signed)
Procedure Name: LMA Insertion Date/Time: 08/31/2018 11:10 AM Performed by: Signe Colt, CRNA Pre-anesthesia Checklist: Patient identified, Emergency Drugs available, Suction available and Patient being monitored Patient Re-evaluated:Patient Re-evaluated prior to induction Oxygen Delivery Method: Circle system utilized Preoxygenation: Pre-oxygenation with 100% oxygen Induction Type: IV induction Ventilation: Mask ventilation without difficulty LMA: LMA inserted LMA Size: 4.0 Number of attempts: 1 Airway Equipment and Method: Bite block Placement Confirmation: positive ETCO2 Tube secured with: Tape Dental Injury: Teeth and Oropharynx as per pre-operative assessment

## 2018-08-31 NOTE — Progress Notes (Signed)
Nuc med inj performed by  nuc med staff. Pt tol well with additional fentanyl for comfort. VSS. Emotional support provided. Hindi interpreter at bedside assisting with communication during nuc med inj and nerve block.

## 2018-08-31 NOTE — Transfer of Care (Signed)
Immediate Anesthesia Transfer of Care Note  Patient: Emily Hendricks  Procedure(s) Performed: RIGHT BREAST LUMPECTOMY WITH RADIOACTIVE SEED AND SENTINEL LYMPH NODE BIOPSY (Right Breast)  Patient Location: PACU  Anesthesia Type:GA combined with regional for post-op pain  Level of Consciousness: drowsy and patient cooperative  Airway & Oxygen Therapy: Patient Spontanous Breathing and Patient connected to nasal cannula oxygen  Post-op Assessment: Report given to RN and Post -op Vital signs reviewed and stable  Post vital signs: Reviewed and stable  Last Vitals:  Vitals Value Taken Time  BP 148/94 08/31/18 1219  Temp    Pulse 103 08/31/18 1220  Resp 12 08/31/18 1220  SpO2 100 % 08/31/18 1220  Vitals shown include unvalidated device data.  Last Pain:  Vitals:   08/31/18 0912  TempSrc: Oral  PainSc: 0-No pain         Complications: No apparent anesthesia complications

## 2018-08-31 NOTE — Discharge Instructions (Signed)
[place] Office Phone Number [phone]  BREAST BIOPSY/ PARTIAL MASTECTOMY: POST OP INSTRUCTIONS  Always review your discharge instruction sheet given to you by the facility where your surgery was performed.  IF YOU HAVE DISABILITY OR FAMILY LEAVE FORMS, YOU MUST BRING THEM TO THE OFFICE FOR PROCESSING.  DO NOT GIVE THEM TO YOUR DOCTOR.  1. A prescription for pain medication may be given to you upon discharge.  Take your pain medication as prescribed, if needed.  If narcotic pain medicine is not needed, then you may take acetaminophen (Tylenol) or ibuprofen (Advil) as needed. No Tylenol until 3:45pm! 2. Take your usually prescribed medications unless otherwise directed 3. If you need a refill on your pain medication, please contact your pharmacy.  They will contact our office to request authorization.  Prescriptions will not be filled after 5pm or on week-ends. 4. You should eat very light the first 24 hours after surgery, such as soup, crackers, pudding, etc.  Resume your normal diet the day after surgery. 5. Most patients will experience some swelling and bruising in the breast.  Ice packs and a good support bra will help.  Swelling and bruising can take several days to resolve.  6. It is common to experience some constipation if taking pain medication after surgery.  Increasing fluid intake and taking a stool softener will usually help or prevent this problem from occurring.  A mild laxative (Milk of Magnesia or Miralax) should be taken according to package directions if there are no bowel movements after 48 hours. 7. Unless discharge instructions indicate otherwise, you may remove your bandages 48 hours after surgery, and you may shower at that time.  You may have steri-strips (small skin tapes) in place directly over the incision.  These strips should be left on the skin for 7-10 days.   Any sutures or staples will be removed at the office during your follow-up  visit. 8. ACTIVITIES:  You may resume regular daily activities (gradually increasing) beginning the next day.  Wearing a good support bra or sports bra (or the breast binder) minimizes pain and swelling.  You may have sexual intercourse when it is comfortable. a. You may drive when you no longer are taking prescription pain medication, you can comfortably wear a seatbelt, and you can safely maneuver your car and apply brakes. b. RETURN TO WORK:  __________1 week_______________ 9. You should see your doctor in the office for a follow-up appointment approximately two weeks after your surgery.  Your doctors nurse will typically make your follow-up appointment when she calls you with your pathology report.  Expect your pathology report 2-3 business days after your surgery.  You may call to check if you do not hear from [place] after three days.   WHEN TO CALL YOUR DOCTOR: 1. Fever over 101.0 2. Nausea and/or vomiting. 3. Extreme swelling or bruising. 4. Continued bleeding from incision. 5. Increased pain, redness, or drainage from the incision.  The clinic staff is available to answer your questions during regular business hours.  Please dont hesitate to call and ask to speak to one of the nurses for clinical concerns.  If you have a medical emergency, go to the nearest emergency room or call 911.  A surgeon from [facility] is always on call at the hospital.  For further questions, please visit [email or web address]     Post Anesthesia Home Care Instructions  Activity: Get plenty of rest for the remainder of the day. A responsible individual must  stay with you for 24 hours following the procedure.  For the next 24 hours, DO NOT: -Drive a car -Paediatric nurse -Drink alcoholic beverages -Take any medication unless instructed by your physician -Make any legal decisions or sign important papers.  Meals: Start with liquid foods such as gelatin or soup. Progress to regular  foods as tolerated. Avoid greasy, spicy, heavy foods. If nausea and/or vomiting occur, drink only clear liquids until the nausea and/or vomiting subsides. Call your physician if vomiting continues.  Special Instructions/Symptoms: Your throat may feel dry or sore from the anesthesia or the breathing tube placed in your throat during surgery. If this causes discomfort, gargle with warm salt water. The discomfort should disappear within 24 hours.  If you had a scopolamine patch placed behind your ear for the management of post- operative nausea and/or vomiting:  1. The medication in the patch is effective for 72 hours, after which it should be removed.  Wrap patch in a tissue and discard in the trash. Wash hands thoroughly with soap and water. 2. You may remove the patch earlier than 72 hours if you experience unpleasant side effects which may include dry mouth, dizziness or visual disturbances. 3. Avoid touching the patch. Wash your hands with soap and water after contact with the patch.

## 2018-08-31 NOTE — Progress Notes (Signed)
Assisted Dr. Witman with right, ultrasound guided, pectoralis block. Side rails up, monitors on throughout procedure. See vital signs in flow sheet. Tolerated Procedure well. 

## 2018-09-01 ENCOUNTER — Encounter (HOSPITAL_BASED_OUTPATIENT_CLINIC_OR_DEPARTMENT_OTHER): Payer: Self-pay | Admitting: General Surgery

## 2018-09-01 NOTE — Anesthesia Postprocedure Evaluation (Signed)
Anesthesia Post Note  Patient: Emily Hendricks  Procedure(s) Performed: RIGHT BREAST LUMPECTOMY WITH RADIOACTIVE SEED AND SENTINEL LYMPH NODE BIOPSY (Right Breast)     Patient location during evaluation: PACU Anesthesia Type: General Level of consciousness: awake and alert Pain management: pain level controlled Vital Signs Assessment: post-procedure vital signs reviewed and stable Respiratory status: spontaneous breathing, nonlabored ventilation and respiratory function stable Cardiovascular status: blood pressure returned to baseline and stable Postop Assessment: no apparent nausea or vomiting Anesthetic complications: no    Last Vitals:  Vitals:   08/31/18 1300 08/31/18 1321  BP: (!) 178/112 (!) 160/96  Pulse: (!) 105 85  Resp: 17 16  Temp:  36.6 C  SpO2: 99% 99%    Last Pain:  Vitals:   08/31/18 1321  TempSrc: Oral  PainSc: 0-No pain                 Lidia Collum

## 2018-09-04 ENCOUNTER — Encounter: Payer: Self-pay | Admitting: *Deleted

## 2018-09-04 ENCOUNTER — Other Ambulatory Visit: Payer: Self-pay | Admitting: Oncology

## 2018-09-04 ENCOUNTER — Telehealth: Payer: Self-pay | Admitting: *Deleted

## 2018-09-04 NOTE — Telephone Encounter (Signed)
Received order for mammaprint testing on final path.

## 2018-09-05 ENCOUNTER — Telehealth: Payer: Self-pay | Admitting: General Surgery

## 2018-09-05 NOTE — Telephone Encounter (Signed)
Discussed pathology with daughter.  Pt still needing a little pain medication.  Using around 2-3 per day.  Decreasing.  Will send refill.  Mammaprint pending.

## 2018-09-13 ENCOUNTER — Ambulatory Visit: Payer: Self-pay

## 2018-09-13 ENCOUNTER — Telehealth: Payer: Self-pay | Admitting: *Deleted

## 2018-09-13 NOTE — Telephone Encounter (Signed)
Left vm for daughter with pt mammaprint result of LOW RISK. Informed next step is xrt with Dr. Sondra Come with appt date and time. Contact information provided for questions or needs.

## 2018-09-15 ENCOUNTER — Encounter: Payer: Self-pay | Admitting: Oncology

## 2018-09-18 ENCOUNTER — Encounter: Payer: Self-pay | Admitting: Radiation Oncology

## 2018-09-18 ENCOUNTER — Other Ambulatory Visit: Payer: Self-pay

## 2018-09-18 ENCOUNTER — Inpatient Hospital Stay: Payer: Medicaid Other | Attending: Oncology | Admitting: Oncology

## 2018-09-18 ENCOUNTER — Ambulatory Visit
Admission: RE | Admit: 2018-09-18 | Discharge: 2018-09-18 | Disposition: A | Discharge status: Home / Self Care | Payer: Medicaid Other | Source: Ambulatory Visit | Attending: Radiation Oncology | Admitting: Radiation Oncology

## 2018-09-18 ENCOUNTER — Encounter: Payer: Self-pay | Admitting: *Deleted

## 2018-09-18 ENCOUNTER — Encounter (INDEPENDENT_AMBULATORY_CARE_PROVIDER_SITE_OTHER): Payer: Self-pay

## 2018-09-18 ENCOUNTER — Inpatient Hospital Stay: Payer: Medicaid Other

## 2018-09-18 ENCOUNTER — Ambulatory Visit: Payer: Medicaid Other | Admitting: Radiation Oncology

## 2018-09-18 VITALS — BP 141/85 | HR 81 | Temp 99.1°F | Resp 17 | Ht 61.0 in | Wt 129.5 lb

## 2018-09-18 VITALS — BP 147/72 | HR 83 | Temp 99.1°F | Resp 18 | Ht 61.0 in | Wt 130.1 lb

## 2018-09-18 DIAGNOSIS — Z170 Estrogen receptor positive status [ER+]: Secondary | ICD-10-CM | POA: Diagnosis not present

## 2018-09-18 DIAGNOSIS — E119 Type 2 diabetes mellitus without complications: Secondary | ICD-10-CM | POA: Insufficient documentation

## 2018-09-18 DIAGNOSIS — E785 Hyperlipidemia, unspecified: Secondary | ICD-10-CM | POA: Diagnosis not present

## 2018-09-18 DIAGNOSIS — Z7984 Long term (current) use of oral hypoglycemic drugs: Secondary | ICD-10-CM | POA: Insufficient documentation

## 2018-09-18 DIAGNOSIS — Z79899 Other long term (current) drug therapy: Secondary | ICD-10-CM | POA: Diagnosis not present

## 2018-09-18 DIAGNOSIS — C50211 Malignant neoplasm of upper-inner quadrant of right female breast: Secondary | ICD-10-CM

## 2018-09-18 DIAGNOSIS — Z809 Family history of malignant neoplasm, unspecified: Secondary | ICD-10-CM | POA: Insufficient documentation

## 2018-09-18 DIAGNOSIS — Z79811 Long term (current) use of aromatase inhibitors: Secondary | ICD-10-CM | POA: Insufficient documentation

## 2018-09-18 DIAGNOSIS — I10 Essential (primary) hypertension: Secondary | ICD-10-CM | POA: Diagnosis not present

## 2018-09-18 LAB — CBC WITH DIFFERENTIAL/PLATELET
Abs Immature Granulocytes: 0.01 10*3/uL (ref 0.00–0.07)
Basophils Absolute: 0 10*3/uL (ref 0.0–0.1)
Basophils Relative: 1 %
Eosinophils Absolute: 0.2 10*3/uL (ref 0.0–0.5)
Eosinophils Relative: 4 %
HCT: 38.6 % (ref 36.0–46.0)
Hemoglobin: 11.8 g/dL — ABNORMAL LOW (ref 12.0–15.0)
Immature Granulocytes: 0 %
Lymphocytes Relative: 38 %
Lymphs Abs: 1.8 10*3/uL (ref 0.7–4.0)
MCH: 26.5 pg (ref 26.0–34.0)
MCHC: 30.6 g/dL (ref 30.0–36.0)
MCV: 86.7 fL (ref 80.0–100.0)
Monocytes Absolute: 0.3 10*3/uL (ref 0.1–1.0)
Monocytes Relative: 6 %
Neutro Abs: 2.4 10*3/uL (ref 1.7–7.7)
Neutrophils Relative %: 51 %
Platelets: 249 10*3/uL (ref 150–400)
RBC: 4.45 MIL/uL (ref 3.87–5.11)
RDW: 13.1 % (ref 11.5–15.5)
WBC: 4.7 10*3/uL (ref 4.0–10.5)
nRBC: 0 % (ref 0.0–0.2)

## 2018-09-18 LAB — COMPREHENSIVE METABOLIC PANEL
ALT: 11 U/L (ref 0–44)
AST: 14 U/L — ABNORMAL LOW (ref 15–41)
Albumin: 4 g/dL (ref 3.5–5.0)
Alkaline Phosphatase: 82 U/L (ref 38–126)
Anion gap: 8 (ref 5–15)
BUN: 14 mg/dL (ref 8–23)
CO2: 27 mmol/L (ref 22–32)
Calcium: 9 mg/dL (ref 8.9–10.3)
Chloride: 107 mmol/L (ref 98–111)
Creatinine, Ser: 0.81 mg/dL (ref 0.44–1.00)
GFR calc Af Amer: >60 mL/min (ref >60)
GFR calc non Af Amer: >60 mL/min (ref >60)
Glucose, Bld: 92 mg/dL (ref 70–99)
Potassium: 4.4 mmol/L (ref 3.5–5.1)
Sodium: 142 mmol/L (ref 135–145)
Total Bilirubin: 0.4 mg/dL (ref 0.3–1.2)
Total Protein: 7.3 g/dL (ref 6.5–8.1)

## 2018-09-18 MED ORDER — ANASTROZOLE 1 MG PO TABS: 1.0000 mg | ORAL_TABLET | Freq: Every day | ORAL | 4 refills | Status: DC

## 2018-09-18 NOTE — Patient Instructions (Signed)
Coronavirus (COVID-19) Are you at risk?  Are you at risk for the Coronavirus (COVID-19)?  To be considered HIGH RISK for Coronavirus (COVID-19), you have to meet the following criteria:  . Traveled to [place], [place], [place], [place] or [place]; or in the [place] to [place], [place], [place], or [place]; and have fever, cough, and shortness of breath within the last 2 weeks of travel OR . Been in close contact with a person diagnosed with COVID-19 within the last 2 weeks and have fever, cough, and shortness of breath . IF YOU DO NOT MEET THESE CRITERIA, YOU ARE CONSIDERED LOW RISK FOR COVID-19.  What to do if you are HIGH RISK for COVID-19?  . If you are having a medical emergency, call 911. . Seek medical care right away. Before you go to a doctor's office, urgent care or emergency department, call ahead and tell them about your recent travel, contact with someone diagnosed with COVID-19, and your symptoms. You should receive instructions from your physician's office regarding next steps of care.  . When you arrive at healthcare provider, tell the healthcare staff immediately you have returned from visiting [place], [place], [place], [place] or [place]; or traveled in the [place] to [place], [place], [place], or [place]; in the last two weeks or you have been in close contact with a person diagnosed with COVID-19 in the last 2 weeks.   . Tell the health care staff about your symptoms: fever, cough and shortness of breath. . After you have been seen by a medical provider, you will be either: o Tested for (COVID-19) and discharged home on quarantine except to seek medical care if symptoms worsen, and asked to  - Stay home and avoid contact with others until you get your results (4-5 days)  - Avoid travel on public transportation if possible (such as bus, train, or airplane) or o Sent to the Emergency Department by EMS for evaluation, COVID-19 testing, and possible  admission depending on your condition and test results.  What to do if you are LOW RISK for COVID-19?  Reduce your risk of any infection by using the same precautions used for avoiding the common cold or flu:  . Wash your hands often with soap and warm water for at least 20 seconds.  If soap and water are not readily available, use an alcohol-based hand sanitizer with at least 60% alcohol.  . If coughing or sneezing, cover your mouth and nose by coughing or sneezing into the elbow areas of your shirt or coat, into a tissue or into your sleeve (not your hands). . Avoid shaking hands with others and consider head nods or verbal greetings only. . Avoid touching your eyes, nose, or mouth with unwashed hands.  . Avoid close contact with people who are sick. . Avoid places or events with large numbers of people in one location, like concerts or sporting events. . Carefully consider travel plans you have or are making. . If you are planning any travel outside or inside the [place], visit the CDC's Travelers' Health webpage for the latest health notices. . If you have some symptoms but not all symptoms, continue to monitor at home and seek medical attention if your symptoms worsen. . If you are having a medical emergency, call 911.   ADDITIONAL HEALTHCARE OPTIONS FOR PATIENTS  [facility] / e-Visit: [email or web address]         [facility]: [phone]  [facility]: [phone]                   [facility]: [phone]   

## 2018-09-18 NOTE — Progress Notes (Signed)
Location of Breast Cancer: upper slightly inner RIGHT breast, at far posterior depth  Histology per Pathology Report: 08/31/18: Diagnosis 1. Breast, lumpectomy, right w/seed - INVASIVE DUCTAL CARCINOMA, 2.5 CM, NOTTINGHAM GRADE 2 OF 3. - MARGINS OF RESECTION ARE NOT INVOLVED (CLOSEST MARGINS: LESS THAN 1 MM, ANTERIOR/INFERIOR AND POSTERIOR). - SEE ONCOLOGY TABLE. 2. Lymph node, sentinel, biopsy, Right axillary #1 - METASTATIC CARCINOMA PRESENT IN ONE OF ONE LYMPH NODE (1/1). 3. Lymph node, sentinel, biopsy, Right axillary #2 - ONE LYMPH NODE, NEGATIVE FOR CARCINOMA (0/1). 4. Lymph node, sentinel, biopsy, Right axillary #3 - ONE LYMPH NODE, NEGATIVE FOR CARCINOMA (0/1). Microscopic Comment 1. INVASIVE CARCINOMA OF THE BREAST: Resection Procedure: Lumpectomy Specimen Laterality: Right Tumor Size: 2.5 cm Histologic Type: Invasive ductal carcinoma Histologic Grade: Glandular (Acinar)/Tubular Differentiation: 3 Nuclear Pleomorphism: 2 Mitotic Rate: 1 Overall Grade: 2 Ductal Carcinoma   Receptor Status: ER(95%), PR (10%), Her2-neu equivocal (2+) by immunohistochemistry but reportedly negative by fluorescent in situ hybridization, Ki-(5%)  Did patient present with symptoms (if so, please note symptoms) or was this found on screening mammography?: Emily Hendricks had a right breast lump saometime in March 2020; she went to [facility] to be evaluated.  She underwent bilateral diagnostic mammography with tomography and right breast ultrasonography at [facility] on 06/29/2018 showing: Breast Density Category C. No dominant masses, suspicious calcifications or secondary signs of malignancy are identified within the LEFT breast.   There is a spiculated mass within the upper slightly inner RIGHT breast, at far posterior depth, measuring approximately 2.2 cm greatest dimension, corresponding to the area of clinical concern. No dominant masses, suspicious  calcifications or secondary signs of malignancy are identified elsewhere within the RIGHT breast. . Targeted ultrasound is performed, showing an irregular hypoechoic mass in the RIGHT breast at the 12:30 o'clock axis, 8 cm from the nipple, measuring 2.7 x 1.7 x 1.9 cm. The mass abuts and may involve the pectoralis muscle. RIGHT axilla was evaluated with ultrasound showing no enlarged or morphologically abnormal lymph nodes.  Accordingly on 07/03/2018 she proceeded to biopsy of the right breast area in question. 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 Prognostic indicators significant for: estrogen receptor, 95% positive with strong staining intensity and progesterone receptor, 10% positive with weak staining intensity. Proliferation marker Ki67 at 5%. HER2 equivocal (2+) by immunohistochemistry but reportedly negative by fluorescent in situ hybridization  Past/Anticipated interventions by surgeon, if any: 08/31/18:  Right Breast Radioactive seed localized lumpectomy and sentinel lymph node biopsy  Indications: This patient presents with history of right breast cancer, cT2N0, upper inner quadrant, G2, +/+/-  Pre-operative Diagnosis: right breast cancer  Post-operative Diagnosis: Same  Surgeon: Stark Klein   Past/Anticipated interventions by medical oncology, if any: Chemotherapy Per Dr. Jana Hakim 09/18/18:  She is now ready to start radiation and she will see Dr. Sondra Come in about an hour.  We are going to hold the anastrozole until the radiation is done and then she will resume it.  She understands she will need to be on anastrozole for a total of 5 years.  Lymphedema issues, if any:  Pt reports "sometimes" s/s lymphedema  Pain issues, if any:  Pt denies c/o pain.  SAFETY ISSUES:  Prior radiation? No   Pacemaker/ICD? No  Possible current pregnancy? No  Is the patient on methotrexate? No  Current  Complaints / other details:  Pt presents today for initial consult with Dr. Sondra Come for Radiation  Oncology. Pt is accompanied by interpreter.  BP (!) 147/72 (BP Location: Left Arm, Patient Position: Sitting)   Pulse 83   Temp 99.1 F (37.3 C) (Temporal)   Resp 18   Ht _0  (1.549 m)   Wt 130 lb 2 oz (59 kg)   SpO2 100%   BMI 24.59 kg/m   Wt Readings from Last 3 Encounters:  09/18/18 130 lb 2 oz (59 kg)  09/18/18 129 lb 8 oz (58.7 kg)  08/31/18 130 lb 1.1 oz (59 kg)       Loma Sousa, RN 09/18/2018,12:51 PM

## 2018-09-18 NOTE — Progress Notes (Signed)
[place]  Telephone:(336) [phone] Fax:(336) [phone]    ID: Emily Hendricks DOB: 17-Jan-1956  MR#: [id number]  BMW#:[id number]  Patient Care Team: Gildardo Pounds, NP as PCP - General (Nurse Practitioner) Mauro Kaufmann, RN as Oncology Nurse Navigator Rockwell Germany, RN as Oncology Nurse Navigator Stark Klein, MD as Consulting Physician (General Surgery) Donnalyn Juran, Virgie Dad, MD as Consulting Physician (Oncology) Gery Pray, MD as Consulting Physician (Radiation Oncology) OTHER MD:   CHIEF COMPLAINT: Estrogen receptor positive breast cancer  CURRENT TREATMENT: Anastrozole   HISTORY OF CURRENT ILLNESS: From the original intake note:  Emily Hendricks had a right breast lump saometime in March 2020; she went to [facility] to be evaluated.  She underwent bilateral diagnostic mammography with tomography and right breast ultrasonography at [facility] on 06/29/2018 showing: Breast Density Category C. No dominant masses, suspicious calcifications or secondary signs of malignancy are identified within the LEFT breast.   There is a spiculated mass within the upper slightly inner RIGHT breast, at far posterior depth, measuring approximately 2.2 cm greatest dimension, corresponding to the area of clinical concern. No dominant masses, suspicious calcifications or secondary signs of malignancy are identified elsewhere within the RIGHT breast. . Targeted ultrasound is performed, showing an irregular hypoechoic mass in the RIGHT breast at the 12:30 o'clock axis, 8 cm from the nipple, measuring 2.7 x 1.7 x 1.9 cm. The mass abuts and may involve the pectoralis muscle. RIGHT axilla was evaluated with ultrasound showing no enlarged or morphologically abnormal lymph nodes.  Accordingly on 07/03/2018 she proceeded to biopsy of the right breast area in question. 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 Prognostic indicators significant for: estrogen receptor, 95% positive with strong staining intensity and progesterone receptor, 10% positive with weak staining intensity. Proliferation marker Ki67 at 5%. HER2 equivocal (2+) by immunohistochemistry but reportedly negative by fluorescent in situ hybridization.  The patient's subsequent history is as detailed below.   INTERVAL HISTORY: Emily Hendricks returns today for follow-up and treatment of her strogen receptor positive breast cancer accompanied by an interpreter  She has been on anastrozole. She took the medication for three months and then discontinued the medication because she thought that it was only for a short period of time. She only paid a few dollars for the medication. She will resume anastrozole after radiation.   Emily Hendricks does not have a bone density scan on file.  Genetic testing on 07/12/2018 through the common hereditary cancer panel found no pathogenic mutations in APC, ATM, AXIN2, BARD1, BMPR1A, BRCA1, BRCA2, BRIP1, CDH1, CDK4, CDKN2A (p14ARF), CDKN2A (p16INK4a), CHEK2, CTNNA1, DICER1, EPCAM (Deletion/duplication testing only), GREM1 (promoter region deletion/duplication testing only), KIT, MEN1, MLH1, MSH2, MSH3, MSH6, MUTYH, NBN, NF1, NHTL1, PALB2, PDGFRA, PMS2, POLD1, POLE, PTEN, RAD50, RAD51C, RAD51D, RNF43, SDHB, SDHC, SDHD, SMAD4, SMARCA4. STK11, TP53, TSC1, TSC2, and VHL.  The following genes were evaluated for sequence changes only: SDHA and HOXB13 c.251G>A variant only.  The Oncotype DX score was 21 predicting a risk of outside the breast recurrence over the next 9 years of 7% if the patient's only systemic therapy is tamoxifen for 5 years.  She underwent a right breast lumpectomy on 08/31/2018. The pathology from this procedure showed ([id number]):  1. Breast, lumpectomy, right w/seed - invasive ductal carcinoma, 2.5 cm, nottingham grade 2 of 3. - margins of resection are not  involved (closest margins: less than 1 mm, anterior/inferior and Posterior). -  see oncology table. 2. Lymph node, sentinel, biopsy, right axillary #1 - metastatic carcinoma present in one of one lymph node (1/1). 3. Lymph node, sentinel, biopsy, right axillary #2 - one lymph node, negative for carcinoma (0/1). 4. Lymph node, sentinel, biopsy, right axillary #3   - one lymph node, negative for carcinoma (0/1).  [facility] Mammaprint on 08/31/2018 shows a low risk predicting a 97.8% chance of no recurrence at 5 years out on hormonal therapy alone.   She has an appointment with Dr. Sondra Come on 09/18/2018 to start her radiation treatments.   REVIEW OF SYSTEMS: Female has a little bit of pain from her surgery, but she said that the medicine helped. She notes that she does still have some pain in her breast. The patient denies unusual headaches, visual changes, nausea, vomiting, or dizziness. There has been no unusual cough, phlegm production, or pleurisy. This been no change in bowel or bladder habits. The patient denies unexplained fatigue or unexplained weight loss, bleeding, rash, or fever. A detailed review of systems was otherwise noncontributory.    PAST MEDICAL HISTORY: Past Medical History:  Diagnosis Date   Diabetes mellitus without complication ([place])    Family history of cancer    Hyperlipidemia    Hypertension      PAST SURGICAL HISTORY: Past Surgical History:  Procedure Laterality Date   BREAST LUMPECTOMY WITH RADIOACTIVE SEED AND SENTINEL LYMPH NODE BIOPSY Right 08/31/2018   Procedure: RIGHT BREAST LUMPECTOMY WITH RADIOACTIVE SEED AND SENTINEL LYMPH NODE BIOPSY;  Surgeon: Stark Klein, MD;  Location: [place];  Service: General;  Laterality: Right;   NO PAST SURGERIES     TUBAL LIGATION    Tubal ligation in 1990 or 1991.    FAMILY HISTORY: Family History  Problem Relation Age of Onset   Cancer Mother 45       unknown cancer, in her lower  abdomen   Cancer Sister 69       unknown cancer in her lower abdomen   Heart disease Brother    Cancer Niece        unknown form of cancer   Londen's father died from heart complications following a heart surgery about 45 years ago. Patients' mother died from unknown cancer in her 92's. The patient has 6 brothers and 1 sisters. Her sister had cancer of unknown type, but that was in her lower abdomen. Patient denies anyone else in her family having cancer.     GYNECOLOGIC HISTORY:  No LMP recorded. Patient is postmenopausal. 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 She moved here from [place], [place] in 2005. Her husband is partially disabled. At home with them is her mother-in-law, her son, and her daughter-in-law. Gwenette's son's name is [employer].  The patient's daughter, Emily Hendricks, lives in [place] with her husband  ADVANCED DIRECTIVES: In the absence of any documentation, Emily Hendricks's spouse, is her healthcare power of attorney. The appropriate documentation is provided on 07/12/2018 for her to review, notarize, and return and her earliest convenience.  The patient intends to name her daughter Emily Hendricks as her healthcare power of attorney.  She also has the daughter's phone number as her primary contact   HEALTH MAINTENANCE: Social History   Tobacco Use   Smoking status:  Never Smoker   Smokeless tobacco: Never Used  Substance Use Topics   Alcohol use: No   Drug use: No    Colonoscopy:   PAP:   Bone density:   Allergies  Allergen Reactions   Lisinopril Cough    Current Outpatient Medications  Medication Sig Dispense Refill   acetaminophen (TYLENOL) 500 MG tablet Take by mouth every 6 (six) hours as needed. Pt does not know dosage      anastrozole (ARIMIDEX) 1 MG tablet Take 1 tablet (1 mg total) by mouth daily. 90 tablet 4   Cholecalciferol (VITAMIN D) 2000 units tablet Take 1 tablet (2,000 Units total) by mouth daily. 30 tablet 5   ibuprofen (ADVIL,MOTRIN) 600 MG tablet Take 1 tablet (600 mg total) by mouth every 8 (eight) hours as needed for headache. Take with food. 40 tablet 0   losartan (COZAAR) 50 MG tablet Take 1 tablet (50 mg total) by mouth daily. 90 tablet 3   metFORMIN (GLUCOPHAGE) 500 MG tablet TAKE 1 TABLET BY MOUTH TWICE DAILY WITH MEALS 60 tablet 3   simvastatin (ZOCOR) 20 MG tablet TAKE 1 TABLET BY MOUTH DAILY. 30 tablet 2   No current facility-administered medications for this visit.      OBJECTIVE: Middle-aged [place] woman, in no acute distress  Vitals:   09/18/18 1103  BP: (!) 141/85  Pulse: 81  Resp: 17  Temp: 99.1 F (37.3 C)  SpO2: 100%     Body mass index is 24.47 kg/m.   Wt Readings from Last 3 Encounters:  09/18/18 129 lb 8 oz (58.7 kg)  08/31/18 130 lb 1.1 oz (59 kg)  07/12/18 133 lb (60.3 kg)      ECOG FS:1 - Symptomatic but completely ambulatory  Sclerae unicteric, EOMs intact Wearing a mask No cervical or supraclavicular adenopathy Lungs no rales or rhonchi Heart regular rate and rhythm Abd soft, nontender, positive bowel sounds MSK no focal spinal tenderness, no upper extremity lymphedema Neuro: nonfocal, well oriented, appropriate affect Breasts: The right breast is status post lumpectomy.  The incision is healing nicely with no dehiscence erythema or swelling.  The cosmetic result is excellent.  There is no evidence of residual or recurrent disease.  The left breast is benign.  Both axillae are benign.   LAB RESULTS:  CMP     Component Value Date/Time   NA 142 09/18/2018 1036   NA 142 06/06/2018 1639   K 4.4 09/18/2018 1036   CL 107 09/18/2018 1036   CO2 27 09/18/2018 1036   GLUCOSE 92 09/18/2018 1036   BUN 14 09/18/2018 1036   BUN 15 06/06/2018 1639     CREATININE 0.81 09/18/2018 1036   CREATININE 0.83 07/12/2018 0848   CREATININE 0.78 02/03/2016 1613   CALCIUM 9.0 09/18/2018 1036   PROT 7.3 09/18/2018 1036   PROT 6.5 06/06/2018 1639   ALBUMIN 4.0 09/18/2018 1036   ALBUMIN 4.3 06/06/2018 1639   AST 14 (L) 09/18/2018 1036   AST 17 07/12/2018 0848   ALT 11 09/18/2018 1036   ALT 12 07/12/2018 0848   ALKPHOS 82 09/18/2018 1036   BILITOT 0.4 09/18/2018 1036   BILITOT 0.5 07/12/2018 0848   GFRNONAA >60 09/18/2018 1036   GFRNONAA >60 07/12/2018 0848   GFRNONAA 82 02/03/2016 1613   GFRAA >60 09/18/2018 1036   GFRAA >60 07/12/2018 0848   GFRAA >89 02/03/2016 1613    No results found for: TOTALPROTELP, ALBUMINELP, A1GS, A2GS, BETS, BETA2SER, GAMS, MSPIKE, SPEI  No results  found for: Nils Pyle, [facility]  Lab Results  Component Value Date   WBC 4.7 09/18/2018   NEUTROABS 2.4 09/18/2018   HGB 11.8 (L) 09/18/2018   HCT 38.6 09/18/2018   MCV 86.7 09/18/2018   PLT 249 09/18/2018    _0 @  No results found for: LABCA2  No components found for: [id number]  No results for input(s): INR in the last 168 hours.  No results found for: LABCA2  No results found for: WUJ811  No results found for: BJY782  No results found for: NFA213  No results found for: CA2729  No components found for: HGQUANT  No results found for: CEA1 / No results found for: CEA1   No results found for: AFPTUMOR  No results found for: CHROMOGRNA  No results found for: PSA1  Appointment on 09/18/2018  Component Date Value Ref Range Status   Sodium 09/18/2018 142  135 - 145 mmol/L Final   Potassium 09/18/2018 4.4  3.5 - 5.1 mmol/L Final   Chloride 09/18/2018 107  98 - 111 mmol/L Final   CO2 09/18/2018 27  22 - 32 mmol/L Final   Glucose, Bld 09/18/2018 92  70 - 99 mg/dL Final   BUN 09/18/2018 14  8 - 23 mg/dL Final   Creatinine, Ser 09/18/2018 0.81  0.44 - 1.00 mg/dL Final   Calcium 09/18/2018 9.0  8.9 - 10.3  mg/dL Final   Total Protein 09/18/2018 7.3  6.5 - 8.1 g/dL Final   Albumin 09/18/2018 4.0  3.5 - 5.0 g/dL Final   AST 09/18/2018 14* 15 - 41 U/L Final   ALT 09/18/2018 11  0 - 44 U/L Final   Alkaline Phosphatase 09/18/2018 82  38 - 126 U/L Final   Total Bilirubin 09/18/2018 0.4  0.3 - 1.2 mg/dL Final   GFR calc non Af Amer 09/18/2018 >60  >60 mL/min Final   GFR calc Af Amer 09/18/2018 >60  >60 mL/min Final   Anion gap 09/18/2018 8  5 - 15 Final   Performed at [facility], [place] [address], [place], [place] [address]   WBC 09/18/2018 4.7  4.0 - 10.5 K/uL Final   RBC 09/18/2018 4.45  3.87 - 5.11 MIL/uL Final   Hemoglobin 09/18/2018 11.8* 12.0 - 15.0 g/dL Final   HCT 09/18/2018 38.6  36.0 - 46.0 % Final   MCV 09/18/2018 86.7  80.0 - 100.0 fL Final   MCH 09/18/2018 26.5  26.0 - 34.0 pg Final   MCHC 09/18/2018 30.6  30.0 - 36.0 g/dL Final   RDW 09/18/2018 13.1  11.5 - 15.5 % Final   Platelets 09/18/2018 249  150 - 400 K/uL Final   nRBC 09/18/2018 0.0  0.0 - 0.2 % Final   Neutrophils Relative % 09/18/2018 51  % Final   Neutro Abs 09/18/2018 2.4  1.7 - 7.7 K/uL Final   Lymphocytes Relative 09/18/2018 38  % Final   Lymphs Abs 09/18/2018 1.8  0.7 - 4.0 K/uL Final   Monocytes Relative 09/18/2018 6  % Final   Monocytes Absolute 09/18/2018 0.3  0.1 - 1.0 K/uL Final   Eosinophils Relative 09/18/2018 4  % Final   Eosinophils Absolute 09/18/2018 0.2  0.0 - 0.5 K/uL Final   Basophils Relative 09/18/2018 1  % Final   Basophils Absolute 09/18/2018 0.0  0.0 - 0.1 K/uL Final   Immature Granulocytes 09/18/2018 0  % Final   Abs Immature Granulocytes 09/18/2018 0.01  0.00 - 0.07 K/uL Final   Performed at  [facility], [place] [address] [place], [place] [address]    (this displays the last labs from the last 3 days)  No results found for: TOTALPROTELP, ALBUMINELP, A1GS, A2GS, BETS, BETA2SER, GAMS, MSPIKE, SPEI (this displays  SPEP labs)  No results found for: KPAFRELGTCHN, LAMBDASER, KAPLAMBRATIO (kappa/lambda light chains)  No results found for: HGBA, HGBA2QUANT, HGBFQUANT, HGBSQUAN (Hemoglobinopathy evaluation)   No results found for: LDH  No results found for: IRON, TIBC, IRONPCTSAT (Iron and TIBC)  No results found for: FERRITIN  Urinalysis No results found for: COLORURINE, APPEARANCEUR, LABSPEC, PHURINE, GLUCOSEU, HGBUR, BILIRUBINUR, KETONESUR, PROTEINUR, UROBILINOGEN, NITRITE, LEUKOCYTESUR   STUDIES:  Nm Sentinel Node Inj-no Rpt (breast)  Result Date: 08/31/2018 Sulfur colloid was injected by the nuclear medicine technologist for melanoma sentinel node.   Mm Breast Surgical Specimen  Result Date: 08/31/2018 CLINICAL DATA:  Specimen radiograph status post right breast lumpectomy. EXAM: SPECIMEN RADIOGRAPH OF THE RIGHT BREAST COMPARISON:  Previous exam(s). FINDINGS: Status post excision of the right breast. The radioactive seed and biopsy marker clip are present and completely intact. This was communicated to the OR at 11:39 a.m. IMPRESSION: Specimen radiograph of the right breast. Electronically Signed   By: Ammie Ferrier M.D.   On: 08/31/2018 11:41   [place] Rt Radioactive Seed Loc  Result Date: 08/30/2018 CLINICAL DATA:  Patient presents for ultrasound-guided placement of radioactive seed prior to breast excision. Patient was recently diagnosed with invasive ductal carcinoma in the 12:30 o'clock location of the RIGHT breast. EXAM: ULTRASOUND GUIDED RADIOACTIVE SEED LOCALIZATION OF THE RIGHT BREAST COMPARISON:  Previous exam(s). FINDINGS: Patient presents for radioactive seed localization prior to excision. I met with the patient and we discussed the procedure of seed localization including benefits and alternatives. We discussed the high likelihood of a successful procedure. We discussed the risks of the procedure including infection, bleeding, tissue injury and further surgery. We discussed the low  dose of radioactivity involved in the procedure. Informed, written consent was given. The usual time-out protocol was performed immediately prior to the procedure. Using ultrasound guidance, sterile technique, 1% lidocaine and an I-125 radioactive seed, the mass in the 12:30 o'clock location of the RIGHT breast was localized using a MEDIAL to LATERAL approach. The follow-up mammogram images confirm the seed in the expected location and were marked for Dr. Barry Dienes. Follow-up survey of the patient confirms presence of the radioactive seed. Order number of I-125 seed:  [id number]. Total activity:  0.240 millicuries reference Date: 08/03/2018 The patient tolerated the procedure well and was released from the [facility]. She was given instructions regarding seed removal. IMPRESSION: Radioactive seed localization of the RIGHT breast. No apparent complications. Electronically Signed   By: Nolon Nations M.D.   On: 08/30/2018 15:30   Mm Clip Placement Right  Result Date: 08/30/2018 CLINICAL DATA:  Status post placement of radioactive seed in the 12:30 o'clock location of the RIGHT breast using ultrasound guidance. EXAM: DIAGNOSTIC RIGHT MAMMOGRAM POST ULTRASOUND-GUIDED RADIOACTIVE SEED PLACEMENT COMPARISON:  Previous exam(s). FINDINGS: Tomographic images were obtained following ultrasound-guided radioactive seed placement. These views confirm presence of seed adjacent to the tissue marker clip within the mass in the posterior UPPER INNER QUADRANT of the RIGHT breast. The clip is not seen on the craniocaudal projection due to its location. IMPRESSION: Appropriate location of the radioactive seed. Final Assessment: Post Procedure Mammograms for Seed Placement Electronically Signed   By: Nolon Nations M.D.   On: 08/30/2018 15:32     ELIGIBLE FOR AVAILABLE RESEARCH  PROTOCOL: no   ASSESSMENT: 63 y.o. [place] woman status post right breast biopsy 07/03/2018 for a clinical T2 N0, stage IB invasive  ductal carcinoma, grade 2, estrogen receptor strongly positive, progesterone receptor weakly positive, with no HER-2 amplification and an MIB-1 of 5%.  (1) neoadjuvant anastrozole started 07/12/2018, interrupted during radiation  (a) bone density  (2) status post right lumpectomy and sentinel lymph node sampling 08/31/2018 for a pT2 pN0, stage IB invasive ductal carcinoma, grade 2, with close but negative margins  (a) a total of 3 sentinel lymph nodes removed  (3) MammaPrint shows a luminal type low risk tumor, predicting a 97.8% chance of being alive without distant recurrence at 5 years with antiestrogen therapy alone [without chemotherapy]  (4) adjuvant radiation pending  (5) Genetic testing on 07/12/2018 through the Common Hereditary Gene Panel offered by [facility] found no pathogenic mutations in APC, ATM, AXIN2, BARD1, BMPR1A, BRCA1, BRCA2, BRIP1, CDH1, CDK4, CDKN2A (p14ARF), CDKN2A (p16INK4a), CHEK2, CTNNA1, DICER1, EPCAM (Deletion/duplication testing only), GREM1 (promoter region deletion/duplication testing only), KIT, MEN1, MLH1, MSH2, MSH3, MSH6, MUTYH, NBN, NF1, NHTL1, PALB2, PDGFRA, PMS2, POLD1, POLE, PTEN, RAD50, RAD51C, RAD51D, RNF43, SDHB, SDHC, SDHD, SMAD4, SMARCA4. STK11, TP53, TSC1, TSC2, and VHL.  The following genes were evaluated for sequence changes only: SDHA and HOXB13 c.251G>A variant only.   PLAN:  Sharline did very well with her surgery in terms of cosmesis as well as results and I gave her a copy of the path report.  I also gave her a copy of the MammaPrint report which is low risk and indicates she will not need chemotherapy in order to have very good prognosis.  I am hopeful the family will be able to understand these documents but unfortunately her daughter could not be with her today because of the pandemic.  I did write down the basic information for her  She is now ready to start radiation and she will see Dr. Sondra Come in about an hour.  We are going to hold the  anastrozole until the radiation is done and then she will resume it.  She understands she will need to be on anastrozole for a total of 5 years.  She will have a bone density with her next mammography.  She will return to see me in approximately 3 months.  They know to call for any other issue that may develop before the next visit here.   Reakwon Barren, Virgie Dad, MD  09/18/18 11:32 AM Medical Oncology and Hematology [facility] [address] [place], [place] [address] Tel. [phone]    Fax. [phone]   I, Jacqualyn Posey am acting as a Education administrator for Chauncey Cruel, MD.   I, Lurline Del MD, have reviewed the above documentation for accuracy and completeness, and I agree with the above.

## 2018-09-18 NOTE — Progress Notes (Signed)
Radiation Oncology         [phone] ________________________________  Initial Outpatient Consultation  Name: Emily Hendricks MRN: [id number]  Date: 09/18/2018  DOB: 1956-03-02  YP:[id number], Emily Buff, NP  Emily Pounds, NP   REFERRING PHYSICIAN: Gildardo Pounds, NP  DIAGNOSIS: The encounter diagnosis was Malignant neoplasm of upper-inner quadrant of right breast in female, estrogen receptor positive ([place]).   Stage IB (pT2, pN49m) Right Breast IDC, ER+ /Ysidro Evert/Her2-, grade 2  HISTORY OF PRESENT ILLNESS::Emily Hendricks a 63y.o. female who is accompanied by an  interpreter. The patient presented with a palpable mass to her PCP on 06/06/2018. She underwent bilateral diagnostic mammogram with right axilla ultrasound on 06/29/2018, which showed: highly suspicious mass in the right breast at the 12:30 o'clock axis, 8 cm from the nipple, measuring 2.7 cm; the mass abuts and may involve the pectoralis muscle; no evidence of malignancy within the left breast or right axilla.  The patient underwent biopsy of the right breast area in question on 07/03/2018 showing: invasive mammary carcinoma, grade 2. E-cadherin returned positive, consistent with a ductal phenotype. Prognostic indicators: ER 95% positive with strong staining intensity, PR 10% positive with weak staining intensity; Proliferation Ki67 of 5%; Her2 equivocal by immunohistochemistry, reportedly negative by FISH.  Her case was presented in the multidsciplinary breast cancer conference on 07/12/2018. She underwent genetic testing that day, which revealed negative results. She was also started on neoadjuvant anastrozole at that time under Dr. MJana Hakim   She underwent bilateral breast MRI on 07/20/2018, which revealed: no additional suspicious abnormalities right breast; no suspicious axillary lymph nodes; normal left breast.  She proceeded to right breast lumpectomy with sentinel lymph node biopsy on 08/31/2018 under Dr. BBarry Dienes Pathology  from the procedure showed: invasive ductal carcinoma, grade 2, 2.5 cm; margins of resection not involved; of the three biopsied lymph nodes, one was positive for metastatic carcinoma (1/3).  This lymph node also showed extracapsular extension.  Mammaprint from the final surgical specimen showed low risk.   PREVIOUS RADIATION THERAPY: No  PAST MEDICAL HISTORY:  has a past medical history of Diabetes mellitus without complication ([place], Family history of cancer, Hyperlipidemia, and Hypertension.    PAST SURGICAL HISTORY: Past Surgical History:  Procedure Laterality Date   BREAST LUMPECTOMY WITH RADIOACTIVE SEED AND SENTINEL LYMPH NODE BIOPSY Right 08/31/2018   Procedure: RIGHT BREAST LUMPECTOMY WITH RADIOACTIVE SEED AND SENTINEL LYMPH NODE BIOPSY;  Surgeon: BStark Klein MD;  Location[facility]  Service: General;  Laterality: Right;   NO PAST SURGERIES     TUBAL LIGATION      FAMILY HISTORY: family history includes Cancer in her niece; Cancer (age of onset: 437 in her sister; Cancer (age of onset: 5108 in her mother; Heart disease in her brother.  SOCIAL HISTORY:  reports that she has never smoked. She has never used smokeless tobacco. She reports that she does not drink alcohol or use drugs.  ALLERGIES: Lisinopril  MEDICATIONS:  Current Outpatient Medications  Medication Sig Dispense Refill   acetaminophen (TYLENOL) 500 MG tablet Take by mouth every 6 (six) hours as needed. Pt does not know dosage     anastrozole (ARIMIDEX) 1 MG tablet Take 1 tablet (1 mg total) by mouth daily. 90 tablet 4   Cholecalciferol (VITAMIN D) 2000 units tablet Take 1 tablet (2,000 Units total) by mouth daily. 30 tablet 5   ibuprofen (ADVIL,MOTRIN) 600 MG tablet Take 1 tablet (600 mg total) by mouth every 8 (  eight) hours as needed for headache. Take with food. 40 tablet 0   losartan (COZAAR) 50 MG tablet Take 1 tablet (50 mg total) by mouth daily. 90 tablet 3   metFORMIN (GLUCOPHAGE)  500 MG tablet TAKE 1 TABLET BY MOUTH TWICE DAILY WITH MEALS 60 tablet 3   simvastatin (ZOCOR) 20 MG tablet TAKE 1 TABLET BY MOUTH DAILY. 30 tablet 2   No current facility-administered medications for this encounter.     REVIEW OF SYSTEMS:  A 10+ POINT REVIEW OF SYSTEMS WAS OBTAINED including neurology, dermatology, psychiatry, cardiac, respiratory, lymph, extremities, GI, GU, musculoskeletal, constitutional, reproductive, HEENT. She reports some lymphedema. She denies any pain.   PHYSICAL EXAM:  height is 5' 1"  (1.549 m) and weight is 130 lb 2 oz (59 kg). Her temporal temperature is 99.1 F (37.3 C). Her blood pressure is 147/72 (abnormal) and her pulse is 83. Her respiration is 18 and oxygen saturation is 100%.   General: Alert and oriented, in no acute distress HEENT: Head is normocephalic. Extraocular movements are intact. Oropharynx is clear. Neck: Neck is supple, no palpable cervical or supraclavicular lymphadenopathy. Heart: Regular in rate and rhythm with no murmurs, rubs, or gallops. Chest: Clear to auscultation bilaterally, with no rhonchi, wheezes, or rales. Abdomen: Soft, nontender, nondistended, with no rigidity or guarding. Extremities: No cyanosis or edema. Lymphatics: see Neck Exam Skin: No concerning lesions. Musculoskeletal: symmetric strength and muscle tone throughout. Neurologic: Cranial nerves II through XII are grossly intact. No obvious focalities. Speech is fluent. Coordination is intact. Psychiatric: Judgment and insight are intact. Affect is appropriate. Left breast no palpable mass nipple discharge or bleeding.  The right breast shows a well-healing scar in the upper inner quadrant with Steri-Strips in place.  The patient also has a scar in the axillary region from her sentinel node procedure also healing well.  No dominant mass appreciated breast nipple discharge or bleeding.  No signs of infection within the right breast  ECOG = 1  0 - Asymptomatic (Fully  active, able to carry on all predisease activities without restriction)  1 - Symptomatic but completely ambulatory (Restricted in physically strenuous activity but ambulatory and able to carry out work of a light or sedentary nature. For example, light housework, office work)  2 - Symptomatic, <50% in bed during the day (Ambulatory and capable of all self care but unable to carry out any work activities. Up and about more than 50% of waking hours)  3 - Symptomatic, >50% in bed, but not bedbound (Capable of only limited self-care, confined to bed or chair 50% or more of waking hours)  4 - Bedbound (Completely disabled. Cannot carry on any self-care. Totally confined to bed or chair)  5 - Death   Eustace Pen MM, Creech RH, Tormey DC, et al. [phone]). "Toxicity and response criteria of the [facility]". [place] Oncol. 5 (6): 649-55  LABORATORY DATA:  Lab Results  Component Value Date   WBC 4.7 09/18/2018   HGB 11.8 (L) 09/18/2018   HCT 38.6 09/18/2018   MCV 86.7 09/18/2018   PLT 249 09/18/2018   NEUTROABS 2.4 09/18/2018   Lab Results  Component Value Date   NA 142 09/18/2018   K 4.4 09/18/2018   CL 107 09/18/2018   CO2 27 09/18/2018   GLUCOSE 92 09/18/2018   CREATININE 0.81 09/18/2018   CALCIUM 9.0 09/18/2018      RADIOGRAPHY: Nm Sentinel Node Inj-no Rpt (breast)  Result Date: 08/31/2018 Sulfur colloid was  injected by the nuclear medicine technologist for melanoma sentinel node.   Mm Breast Surgical Specimen  Result Date: 08/31/2018 CLINICAL DATA:  Specimen radiograph status post right breast lumpectomy. EXAM: SPECIMEN RADIOGRAPH OF THE RIGHT BREAST COMPARISON:  Previous exam(s). FINDINGS: Status post excision of the right breast. The radioactive seed and biopsy marker clip are present and completely intact. This was communicated to the OR at 11:39 a.m. IMPRESSION: Specimen radiograph of the right breast. Electronically Signed   By: Ammie Ferrier M.D.    On: 08/31/2018 11:41   [place] Rt Radioactive Seed Loc  Result Date: 08/30/2018 CLINICAL DATA:  Patient presents for ultrasound-guided placement of radioactive seed prior to breast excision. Patient was recently diagnosed with invasive ductal carcinoma in the 12:30 o'clock location of the RIGHT breast. EXAM: ULTRASOUND GUIDED RADIOACTIVE SEED LOCALIZATION OF THE RIGHT BREAST COMPARISON:  Previous exam(s). FINDINGS: Patient presents for radioactive seed localization prior to excision. I met with the patient and we discussed the procedure of seed localization including benefits and alternatives. We discussed the high likelihood of a successful procedure. We discussed the risks of the procedure including infection, bleeding, tissue injury and further surgery. We discussed the low dose of radioactivity involved in the procedure. Informed, written consent was given. The usual time-out protocol was performed immediately prior to the procedure. Using ultrasound guidance, sterile technique, 1% lidocaine and an I-125 radioactive seed, the mass in the 12:30 o'clock location of the RIGHT breast was localized using a MEDIAL to LATERAL approach. The follow-up mammogram images confirm the seed in the expected location and were marked for Dr. Barry Dienes. Follow-up survey of the patient confirms presence of the radioactive seed. Order number of I-125 seed:  [id number]. Total activity:  0.160 millicuries reference Date: 08/03/2018 The patient tolerated the procedure well and was released from the [place]. She was given instructions regarding seed removal. IMPRESSION: Radioactive seed localization of the RIGHT breast. No apparent complications. Electronically Signed   By: Nolon Nations M.D.   On: 08/30/2018 15:30   Mm Clip Placement Right  Result Date: 08/30/2018 CLINICAL DATA:  Status post placement of radioactive seed in the 12:30 o'clock location of the RIGHT breast using ultrasound guidance. EXAM: DIAGNOSTIC RIGHT  MAMMOGRAM POST ULTRASOUND-GUIDED RADIOACTIVE SEED PLACEMENT COMPARISON:  Previous exam(s). FINDINGS: Tomographic images were obtained following ultrasound-guided radioactive seed placement. These views confirm presence of seed adjacent to the tissue marker clip within the mass in the posterior UPPER INNER QUADRANT of the RIGHT breast. The clip is not seen on the craniocaudal projection due to its location. IMPRESSION: Appropriate location of the radioactive seed. Final Assessment: Post Procedure Mammograms for Seed Placement Electronically Signed   By: Nolon Nations M.D.   On: 08/30/2018 15:32      IMPRESSION: Stage IB (pT2, pN72m) Right Breast IDC, ER+ /Ysidro Evert/Her2-, grade 2.  Patient will be a good candidate for breast conservation with radiation therapy directed at the right breast.  Given the presence of lymph node metastasis (micro metastasis) and the presence of extracapsular extension of this lymph node,  I would also recommend elective coverage of the axillary region at the time of her breast radiation treatment.  We discussed the overall treatment course side effects and potential toxicities of radiation therapy in this situation  through the assistance of an interpreter.  Also spoke with the daughter through telephone connection during the evaluation discussing planning the course of treatment side effects and long-term insulin issues.  .  The patient was encouraged to  ask questions that I answered to the best of my ability.  sHe wishes to proceed with radiation therapy. A patient consent form was discussed and signed.  We retained a copy for our records.  The patient would like to proceed with radiation and will be scheduled for CT simulation.  PLAN: Patient will proceed with CT simulation on July 7 radiation therapy begin the week of 14th.  Anticipate 6 weeks of radiation therapy.  Patient will not be good candidate for hypofractionated accelerated radiation therapy given the coverage of the  axillary region with her breast radiation treatment.    ------------------------------------------------  Blair Promise, PhD, MD  This document serves as a record of services personally performed by Gery Pray, MD. It was created on his behalf by Wilburn Mylar, a trained medical scribe. The creation of this record is based on the scribe's personal observations and the provider's statements to them. This document has been checked and approved by the attending provider.

## 2018-09-19 LAB — VITAMIN D 25 HYDROXY (VIT D DEFICIENCY, FRACTURES): Vit D, 25-Hydroxy: 50.1 ng/mL (ref 30.0–100.0)

## 2018-09-26 ENCOUNTER — Ambulatory Visit: Payer: Medicaid Other | Admitting: Radiation Oncology

## 2018-09-27 ENCOUNTER — Ambulatory Visit: Payer: Medicaid Other | Admitting: Radiation Oncology

## 2018-09-28 ENCOUNTER — Other Ambulatory Visit: Payer: Self-pay

## 2018-09-28 ENCOUNTER — Encounter: Payer: Self-pay | Admitting: *Deleted

## 2018-09-28 ENCOUNTER — Ambulatory Visit
Admission: RE | Admit: 2018-09-28 | Discharge: 2018-09-28 | Disposition: A | Discharge status: Home / Self Care | Payer: Medicaid Other | Source: Ambulatory Visit | Attending: Radiation Oncology | Admitting: Radiation Oncology

## 2018-09-28 DIAGNOSIS — Z170 Estrogen receptor positive status [ER+]: Secondary | ICD-10-CM | POA: Diagnosis not present

## 2018-09-28 DIAGNOSIS — Z510 Encounter for antineoplastic radiation therapy: Secondary | ICD-10-CM | POA: Insufficient documentation

## 2018-09-28 DIAGNOSIS — C50211 Malignant neoplasm of upper-inner quadrant of right female breast: Secondary | ICD-10-CM | POA: Insufficient documentation

## 2018-09-29 ENCOUNTER — Encounter: Payer: Self-pay | Admitting: General Practice

## 2018-09-29 NOTE — Progress Notes (Signed)
[facility] CSW Progress Notes  Call from daughter, Chiana Wamser [phone]).  Concerned that she cannot take patient to upcoming daily radiation treatments as daughter works 3 days/week.  Referred to Transportation Coordinator for help.  Also gave information on how to complete process to enroll in [facility] Transportation program for appointments in other areas.  Daughter needs help w FMLA paperwork - referred to Thompson Caul for this need.  No further questions at this time.  Edwyna Shell, LCSW Clinical Social Worker Phone:  [phone]

## 2018-10-03 DIAGNOSIS — Z510 Encounter for antineoplastic radiation therapy: Secondary | ICD-10-CM | POA: Diagnosis not present

## 2018-10-05 ENCOUNTER — Other Ambulatory Visit: Payer: Self-pay

## 2018-10-05 ENCOUNTER — Ambulatory Visit
Admission: RE | Admit: 2018-10-05 | Discharge: 2018-10-05 | Disposition: A | Discharge status: Home / Self Care | Payer: Medicaid Other | Source: Ambulatory Visit | Attending: Radiation Oncology | Admitting: Radiation Oncology

## 2018-10-05 DIAGNOSIS — Z510 Encounter for antineoplastic radiation therapy: Secondary | ICD-10-CM | POA: Diagnosis not present

## 2018-10-05 DIAGNOSIS — Z170 Estrogen receptor positive status [ER+]: Secondary | ICD-10-CM

## 2018-10-05 DIAGNOSIS — C50211 Malignant neoplasm of upper-inner quadrant of right female breast: Secondary | ICD-10-CM

## 2018-10-05 NOTE — Progress Notes (Signed)
  Radiation Oncology         [phone]) [phone] ________________________________  Name: Emily Hendricks MRN: [id number]  Date: 10/05/2018  DOB: 12/16/1955  Simulation Verification Note    ICD-10-CM   1. Malignant neoplasm of upper-inner quadrant of right breast in female, estrogen receptor positive ([place])  C50.211    Z17.0     Status: outpatient  NARRATIVE: The patient was brought to the treatment unit and placed in the planned treatment position. The clinical setup was verified. Then port films were obtained and uploaded to the radiation oncology medical record software.  The treatment beams were carefully compared against the planned radiation fields. The position location and shape of the radiation fields was reviewed. They targeted volume of tissue appears to be appropriately covered by the radiation beams. Organs at risk appear to be excluded as planned.  Based on my personal review, I approved the simulation verification. The patient's treatment will proceed as planned.  -----------------------------------  Blair Promise, PhD, MD  This document serves as a record of services personally performed by Gery Pray, MD. It was created on his behalf by Rae Lips, a trained medical scribe. The creation of this record is based on the scribe's personal observations and the provider's statements to them. This document has been checked and approved by the attending provider.

## 2018-10-06 ENCOUNTER — Ambulatory Visit
Admission: RE | Admit: 2018-10-06 | Discharge: 2018-10-06 | Disposition: A | Discharge status: Home / Self Care | Payer: Medicaid Other | Source: Ambulatory Visit | Attending: Radiation Oncology | Admitting: Radiation Oncology

## 2018-10-06 ENCOUNTER — Other Ambulatory Visit: Payer: Self-pay

## 2018-10-06 DIAGNOSIS — Z510 Encounter for antineoplastic radiation therapy: Secondary | ICD-10-CM | POA: Diagnosis not present

## 2018-10-09 ENCOUNTER — Other Ambulatory Visit: Payer: Self-pay

## 2018-10-09 ENCOUNTER — Ambulatory Visit
Admission: RE | Admit: 2018-10-09 | Discharge: 2018-10-09 | Disposition: A | Discharge status: Home / Self Care | Payer: Medicaid Other | Source: Ambulatory Visit | Attending: Radiation Oncology | Admitting: Radiation Oncology

## 2018-10-09 DIAGNOSIS — Z510 Encounter for antineoplastic radiation therapy: Secondary | ICD-10-CM | POA: Diagnosis not present

## 2018-10-10 ENCOUNTER — Ambulatory Visit
Admission: RE | Admit: 2018-10-10 | Discharge: 2018-10-10 | Disposition: A | Discharge status: Home / Self Care | Payer: Medicaid Other | Source: Ambulatory Visit | Attending: Radiation Oncology | Admitting: Radiation Oncology

## 2018-10-10 ENCOUNTER — Other Ambulatory Visit: Payer: Self-pay

## 2018-10-10 DIAGNOSIS — Z510 Encounter for antineoplastic radiation therapy: Secondary | ICD-10-CM | POA: Diagnosis not present

## 2018-10-10 DIAGNOSIS — Z170 Estrogen receptor positive status [ER+]: Secondary | ICD-10-CM

## 2018-10-10 DIAGNOSIS — C50211 Malignant neoplasm of upper-inner quadrant of right female breast: Secondary | ICD-10-CM

## 2018-10-10 MED ORDER — RADIAPLEXRX EX GEL
Freq: Two times a day (BID) | CUTANEOUS | Status: DC
  Administered 2018-10-10: 15:00:00 via TOPICAL

## 2018-10-10 MED ORDER — ALRA NON-METALLIC DEODORANT (RAD-ONC)
1.0000 "application " | Freq: Once | TOPICAL | Status: AC
  Administered 2018-10-10: 1 via TOPICAL

## 2018-10-11 ENCOUNTER — Ambulatory Visit
Admission: RE | Admit: 2018-10-11 | Discharge: 2018-10-11 | Disposition: A | Discharge status: Home / Self Care | Payer: Medicaid Other | Source: Ambulatory Visit | Attending: Radiation Oncology | Admitting: Radiation Oncology

## 2018-10-11 ENCOUNTER — Other Ambulatory Visit: Payer: Self-pay

## 2018-10-11 DIAGNOSIS — Z510 Encounter for antineoplastic radiation therapy: Secondary | ICD-10-CM | POA: Diagnosis not present

## 2018-10-12 ENCOUNTER — Ambulatory Visit
Admission: RE | Admit: 2018-10-12 | Discharge: 2018-10-12 | Disposition: A | Discharge status: Home / Self Care | Payer: Medicaid Other | Source: Ambulatory Visit | Attending: Radiation Oncology | Admitting: Radiation Oncology

## 2018-10-12 ENCOUNTER — Other Ambulatory Visit: Payer: Self-pay

## 2018-10-12 DIAGNOSIS — Z510 Encounter for antineoplastic radiation therapy: Secondary | ICD-10-CM | POA: Diagnosis not present

## 2018-10-13 ENCOUNTER — Other Ambulatory Visit: Payer: Self-pay

## 2018-10-13 ENCOUNTER — Ambulatory Visit
Admission: RE | Admit: 2018-10-13 | Discharge: 2018-10-13 | Disposition: A | Discharge status: Home / Self Care | Payer: Medicaid Other | Source: Ambulatory Visit | Attending: Radiation Oncology | Admitting: Radiation Oncology

## 2018-10-13 DIAGNOSIS — Z510 Encounter for antineoplastic radiation therapy: Secondary | ICD-10-CM | POA: Diagnosis not present

## 2018-10-16 ENCOUNTER — Other Ambulatory Visit: Payer: Self-pay

## 2018-10-16 ENCOUNTER — Ambulatory Visit
Admission: RE | Admit: 2018-10-16 | Discharge: 2018-10-16 | Disposition: A | Discharge status: Home / Self Care | Payer: Medicaid Other | Source: Ambulatory Visit | Attending: Radiation Oncology | Admitting: Radiation Oncology

## 2018-10-16 DIAGNOSIS — Z510 Encounter for antineoplastic radiation therapy: Secondary | ICD-10-CM | POA: Diagnosis not present

## 2018-10-17 ENCOUNTER — Other Ambulatory Visit: Payer: Self-pay

## 2018-10-17 ENCOUNTER — Ambulatory Visit
Admission: RE | Admit: 2018-10-17 | Discharge: 2018-10-17 | Disposition: A | Discharge status: Home / Self Care | Payer: Medicaid Other | Source: Ambulatory Visit | Attending: Radiation Oncology | Admitting: Radiation Oncology

## 2018-10-17 DIAGNOSIS — Z510 Encounter for antineoplastic radiation therapy: Secondary | ICD-10-CM | POA: Diagnosis not present

## 2018-10-18 ENCOUNTER — Other Ambulatory Visit: Payer: Self-pay

## 2018-10-18 ENCOUNTER — Ambulatory Visit
Admission: RE | Admit: 2018-10-18 | Discharge: 2018-10-18 | Disposition: A | Discharge status: Home / Self Care | Payer: Medicaid Other | Source: Ambulatory Visit | Attending: Radiation Oncology | Admitting: Radiation Oncology

## 2018-10-18 DIAGNOSIS — Z510 Encounter for antineoplastic radiation therapy: Secondary | ICD-10-CM | POA: Diagnosis not present

## 2018-10-19 ENCOUNTER — Other Ambulatory Visit: Payer: Self-pay

## 2018-10-19 ENCOUNTER — Ambulatory Visit
Admission: RE | Admit: 2018-10-19 | Discharge: 2018-10-19 | Disposition: A | Discharge status: Home / Self Care | Payer: Medicaid Other | Source: Ambulatory Visit | Attending: Radiation Oncology | Admitting: Radiation Oncology

## 2018-10-19 DIAGNOSIS — Z510 Encounter for antineoplastic radiation therapy: Secondary | ICD-10-CM | POA: Diagnosis not present

## 2018-10-20 ENCOUNTER — Ambulatory Visit
Admission: RE | Admit: 2018-10-20 | Discharge: 2018-10-20 | Disposition: A | Discharge status: Home / Self Care | Payer: Medicaid Other | Source: Ambulatory Visit | Attending: Radiation Oncology | Admitting: Radiation Oncology

## 2018-10-20 ENCOUNTER — Other Ambulatory Visit: Payer: Self-pay

## 2018-10-20 DIAGNOSIS — Z510 Encounter for antineoplastic radiation therapy: Secondary | ICD-10-CM | POA: Diagnosis not present

## 2018-10-23 ENCOUNTER — Other Ambulatory Visit: Payer: Self-pay

## 2018-10-23 ENCOUNTER — Ambulatory Visit
Admission: RE | Admit: 2018-10-23 | Discharge: 2018-10-23 | Disposition: A | Discharge status: Home / Self Care | Payer: Medicaid Other | Source: Ambulatory Visit | Attending: Radiation Oncology | Admitting: Radiation Oncology

## 2018-10-23 DIAGNOSIS — C50211 Malignant neoplasm of upper-inner quadrant of right female breast: Secondary | ICD-10-CM | POA: Diagnosis not present

## 2018-10-23 DIAGNOSIS — Z510 Encounter for antineoplastic radiation therapy: Secondary | ICD-10-CM | POA: Diagnosis present

## 2018-10-23 DIAGNOSIS — Z170 Estrogen receptor positive status [ER+]: Secondary | ICD-10-CM | POA: Insufficient documentation

## 2018-10-24 ENCOUNTER — Ambulatory Visit
Admission: RE | Admit: 2018-10-24 | Discharge: 2018-10-24 | Disposition: A | Discharge status: Home / Self Care | Payer: Medicaid Other | Source: Ambulatory Visit | Attending: Radiation Oncology | Admitting: Radiation Oncology

## 2018-10-24 DIAGNOSIS — Z510 Encounter for antineoplastic radiation therapy: Secondary | ICD-10-CM | POA: Diagnosis not present

## 2018-10-25 ENCOUNTER — Other Ambulatory Visit: Payer: Self-pay

## 2018-10-25 ENCOUNTER — Ambulatory Visit
Admission: RE | Admit: 2018-10-25 | Discharge: 2018-10-25 | Disposition: A | Discharge status: Home / Self Care | Payer: Medicaid Other | Source: Ambulatory Visit | Attending: Radiation Oncology | Admitting: Radiation Oncology

## 2018-10-25 DIAGNOSIS — Z510 Encounter for antineoplastic radiation therapy: Secondary | ICD-10-CM | POA: Diagnosis not present

## 2018-10-26 ENCOUNTER — Other Ambulatory Visit: Payer: Self-pay

## 2018-10-26 ENCOUNTER — Ambulatory Visit
Admission: RE | Admit: 2018-10-26 | Discharge: 2018-10-26 | Disposition: A | Discharge status: Home / Self Care | Payer: Medicaid Other | Source: Ambulatory Visit | Attending: Radiation Oncology | Admitting: Radiation Oncology

## 2018-10-26 DIAGNOSIS — Z510 Encounter for antineoplastic radiation therapy: Secondary | ICD-10-CM | POA: Diagnosis not present

## 2018-10-27 ENCOUNTER — Ambulatory Visit
Admission: RE | Admit: 2018-10-27 | Discharge: 2018-10-27 | Disposition: A | Discharge status: Home / Self Care | Payer: Medicaid Other | Source: Ambulatory Visit | Attending: Radiation Oncology | Admitting: Radiation Oncology

## 2018-10-27 ENCOUNTER — Other Ambulatory Visit: Payer: Self-pay

## 2018-10-27 DIAGNOSIS — Z510 Encounter for antineoplastic radiation therapy: Secondary | ICD-10-CM | POA: Diagnosis not present

## 2018-10-30 ENCOUNTER — Other Ambulatory Visit: Payer: Self-pay

## 2018-10-30 ENCOUNTER — Ambulatory Visit
Admission: RE | Admit: 2018-10-30 | Discharge: 2018-10-30 | Disposition: A | Discharge status: Home / Self Care | Payer: Medicaid Other | Source: Ambulatory Visit | Attending: Radiation Oncology | Admitting: Radiation Oncology

## 2018-10-30 DIAGNOSIS — Z510 Encounter for antineoplastic radiation therapy: Secondary | ICD-10-CM | POA: Diagnosis not present

## 2018-10-31 ENCOUNTER — Other Ambulatory Visit: Payer: Self-pay

## 2018-10-31 ENCOUNTER — Other Ambulatory Visit: Payer: Self-pay | Admitting: Nurse Practitioner

## 2018-10-31 ENCOUNTER — Ambulatory Visit
Admission: RE | Admit: 2018-10-31 | Discharge: 2018-10-31 | Disposition: A | Discharge status: Home / Self Care | Payer: Medicaid Other | Source: Ambulatory Visit | Attending: Radiation Oncology | Admitting: Radiation Oncology

## 2018-10-31 DIAGNOSIS — Z510 Encounter for antineoplastic radiation therapy: Secondary | ICD-10-CM | POA: Diagnosis not present

## 2018-10-31 DIAGNOSIS — R7303 Prediabetes: Secondary | ICD-10-CM

## 2018-11-01 ENCOUNTER — Other Ambulatory Visit: Payer: Self-pay

## 2018-11-01 ENCOUNTER — Ambulatory Visit
Admission: RE | Admit: 2018-11-01 | Discharge: 2018-11-01 | Disposition: A | Discharge status: Home / Self Care | Payer: Medicaid Other | Source: Ambulatory Visit | Attending: Radiation Oncology | Admitting: Radiation Oncology

## 2018-11-01 DIAGNOSIS — Z510 Encounter for antineoplastic radiation therapy: Secondary | ICD-10-CM | POA: Diagnosis not present

## 2018-11-02 ENCOUNTER — Other Ambulatory Visit: Payer: Self-pay

## 2018-11-02 ENCOUNTER — Ambulatory Visit
Admission: RE | Admit: 2018-11-02 | Discharge: 2018-11-02 | Disposition: A | Discharge status: Home / Self Care | Payer: Medicaid Other | Source: Ambulatory Visit | Attending: Radiation Oncology | Admitting: Radiation Oncology

## 2018-11-02 DIAGNOSIS — Z510 Encounter for antineoplastic radiation therapy: Secondary | ICD-10-CM | POA: Diagnosis not present

## 2018-11-03 ENCOUNTER — Ambulatory Visit
Admission: RE | Admit: 2018-11-03 | Discharge: 2018-11-03 | Disposition: A | Discharge status: Home / Self Care | Payer: Medicaid Other | Source: Ambulatory Visit | Attending: Radiation Oncology | Admitting: Radiation Oncology

## 2018-11-03 DIAGNOSIS — Z510 Encounter for antineoplastic radiation therapy: Secondary | ICD-10-CM | POA: Diagnosis not present

## 2018-11-06 ENCOUNTER — Other Ambulatory Visit: Payer: Self-pay

## 2018-11-06 ENCOUNTER — Ambulatory Visit
Admission: RE | Admit: 2018-11-06 | Discharge: 2018-11-06 | Disposition: A | Discharge status: Home / Self Care | Payer: Medicaid Other | Source: Ambulatory Visit | Attending: Radiation Oncology | Admitting: Radiation Oncology

## 2018-11-06 DIAGNOSIS — Z510 Encounter for antineoplastic radiation therapy: Secondary | ICD-10-CM | POA: Diagnosis not present

## 2018-11-07 ENCOUNTER — Ambulatory Visit: Payer: Medicaid Other | Admitting: Radiation Oncology

## 2018-11-07 ENCOUNTER — Other Ambulatory Visit: Payer: Self-pay

## 2018-11-07 ENCOUNTER — Ambulatory Visit
Admission: RE | Admit: 2018-11-07 | Discharge: 2018-11-07 | Disposition: A | Discharge status: Home / Self Care | Payer: Medicaid Other | Source: Ambulatory Visit | Attending: Radiation Oncology | Admitting: Radiation Oncology

## 2018-11-07 DIAGNOSIS — Z510 Encounter for antineoplastic radiation therapy: Secondary | ICD-10-CM | POA: Diagnosis not present

## 2018-11-08 ENCOUNTER — Other Ambulatory Visit: Payer: Self-pay

## 2018-11-08 ENCOUNTER — Ambulatory Visit
Admission: RE | Admit: 2018-11-08 | Discharge: 2018-11-08 | Disposition: A | Discharge status: Home / Self Care | Payer: Medicaid Other | Source: Ambulatory Visit | Attending: Radiation Oncology | Admitting: Radiation Oncology

## 2018-11-08 DIAGNOSIS — Z510 Encounter for antineoplastic radiation therapy: Secondary | ICD-10-CM | POA: Diagnosis not present

## 2018-11-09 ENCOUNTER — Ambulatory Visit
Admission: RE | Admit: 2018-11-09 | Discharge: 2018-11-09 | Disposition: A | Discharge status: Home / Self Care | Payer: Medicaid Other | Source: Ambulatory Visit | Attending: Radiation Oncology | Admitting: Radiation Oncology

## 2018-11-09 ENCOUNTER — Other Ambulatory Visit: Payer: Self-pay

## 2018-11-09 DIAGNOSIS — Z510 Encounter for antineoplastic radiation therapy: Secondary | ICD-10-CM | POA: Diagnosis not present

## 2018-11-10 ENCOUNTER — Other Ambulatory Visit: Payer: Self-pay

## 2018-11-10 ENCOUNTER — Ambulatory Visit: Payer: Medicaid Other

## 2018-11-10 ENCOUNTER — Ambulatory Visit
Admission: RE | Admit: 2018-11-10 | Discharge: 2018-11-10 | Disposition: A | Discharge status: Home / Self Care | Payer: Medicaid Other | Source: Ambulatory Visit | Attending: Radiation Oncology | Admitting: Radiation Oncology

## 2018-11-10 DIAGNOSIS — Z510 Encounter for antineoplastic radiation therapy: Secondary | ICD-10-CM | POA: Diagnosis not present

## 2018-11-13 ENCOUNTER — Ambulatory Visit: Payer: Medicaid Other

## 2018-11-13 ENCOUNTER — Other Ambulatory Visit: Payer: Self-pay | Admitting: Nurse Practitioner

## 2018-11-13 ENCOUNTER — Other Ambulatory Visit: Payer: Self-pay

## 2018-11-13 DIAGNOSIS — Z510 Encounter for antineoplastic radiation therapy: Secondary | ICD-10-CM | POA: Diagnosis not present

## 2018-11-13 DIAGNOSIS — E785 Hyperlipidemia, unspecified: Secondary | ICD-10-CM

## 2018-11-14 ENCOUNTER — Ambulatory Visit: Payer: Medicaid Other

## 2018-11-14 ENCOUNTER — Other Ambulatory Visit: Payer: Self-pay

## 2018-11-14 ENCOUNTER — Ambulatory Visit
Admission: RE | Admit: 2018-11-14 | Discharge: 2018-11-14 | Disposition: A | Discharge status: Home / Self Care | Payer: Medicaid Other | Source: Ambulatory Visit | Attending: Radiation Oncology | Admitting: Radiation Oncology

## 2018-11-14 DIAGNOSIS — Z510 Encounter for antineoplastic radiation therapy: Secondary | ICD-10-CM | POA: Diagnosis not present

## 2018-11-15 ENCOUNTER — Ambulatory Visit: Payer: Medicaid Other

## 2018-11-15 ENCOUNTER — Other Ambulatory Visit: Payer: Self-pay

## 2018-11-15 ENCOUNTER — Ambulatory Visit
Admission: RE | Admit: 2018-11-15 | Discharge: 2018-11-15 | Disposition: A | Discharge status: Home / Self Care | Payer: Medicaid Other | Source: Ambulatory Visit | Attending: Radiation Oncology | Admitting: Radiation Oncology

## 2018-11-15 DIAGNOSIS — Z510 Encounter for antineoplastic radiation therapy: Secondary | ICD-10-CM | POA: Diagnosis not present

## 2018-11-16 ENCOUNTER — Ambulatory Visit
Admission: RE | Admit: 2018-11-16 | Discharge: 2018-11-16 | Disposition: A | Discharge status: Home / Self Care | Payer: Medicaid Other | Source: Ambulatory Visit | Attending: Radiation Oncology | Admitting: Radiation Oncology

## 2018-11-16 ENCOUNTER — Ambulatory Visit: Payer: Medicaid Other

## 2018-11-16 ENCOUNTER — Other Ambulatory Visit: Payer: Self-pay

## 2018-11-16 DIAGNOSIS — Z510 Encounter for antineoplastic radiation therapy: Secondary | ICD-10-CM | POA: Diagnosis not present

## 2018-11-17 ENCOUNTER — Ambulatory Visit: Payer: Medicaid Other

## 2018-11-20 ENCOUNTER — Other Ambulatory Visit: Payer: Self-pay

## 2018-11-20 ENCOUNTER — Ambulatory Visit
Admission: RE | Admit: 2018-11-20 | Discharge: 2018-11-20 | Disposition: A | Discharge status: Home / Self Care | Payer: Medicaid Other | Source: Ambulatory Visit | Attending: Radiation Oncology | Admitting: Radiation Oncology

## 2018-11-20 DIAGNOSIS — Z510 Encounter for antineoplastic radiation therapy: Secondary | ICD-10-CM | POA: Diagnosis not present

## 2018-11-21 ENCOUNTER — Other Ambulatory Visit: Payer: Self-pay

## 2018-11-21 ENCOUNTER — Ambulatory Visit: Payer: Medicaid Other

## 2018-11-21 ENCOUNTER — Ambulatory Visit
Admission: RE | Admit: 2018-11-21 | Discharge: 2018-11-21 | Disposition: A | Discharge status: Home / Self Care | Payer: Medicaid Other | Source: Ambulatory Visit | Attending: Radiation Oncology | Admitting: Radiation Oncology

## 2018-11-21 DIAGNOSIS — C50211 Malignant neoplasm of upper-inner quadrant of right female breast: Secondary | ICD-10-CM | POA: Diagnosis not present

## 2018-11-21 DIAGNOSIS — Z510 Encounter for antineoplastic radiation therapy: Secondary | ICD-10-CM | POA: Diagnosis not present

## 2018-11-21 DIAGNOSIS — Z170 Estrogen receptor positive status [ER+]: Secondary | ICD-10-CM | POA: Insufficient documentation

## 2018-11-22 ENCOUNTER — Encounter: Payer: Self-pay | Admitting: *Deleted

## 2018-11-22 ENCOUNTER — Encounter: Payer: Self-pay | Admitting: Radiation Oncology

## 2018-11-22 ENCOUNTER — Other Ambulatory Visit: Payer: Self-pay | Admitting: *Deleted

## 2018-11-22 ENCOUNTER — Ambulatory Visit
Admission: RE | Admit: 2018-11-22 | Discharge: 2018-11-22 | Disposition: A | Discharge status: Home / Self Care | Payer: Medicaid Other | Source: Ambulatory Visit | Attending: Radiation Oncology | Admitting: Radiation Oncology

## 2018-11-22 DIAGNOSIS — Z510 Encounter for antineoplastic radiation therapy: Secondary | ICD-10-CM | POA: Diagnosis not present

## 2018-11-24 ENCOUNTER — Other Ambulatory Visit: Payer: Self-pay | Admitting: Nurse Practitioner

## 2018-11-24 DIAGNOSIS — E785 Hyperlipidemia, unspecified: Secondary | ICD-10-CM

## 2018-12-12 NOTE — Progress Notes (Signed)
[place]  Telephone:([phone]) [phone] Fax:(336) [phone]    ID: Emily Hendricks DOB: 1956/01/10  MR#: [id number]  HQP#:[id number]  Patient Care Team: Gildardo Pounds, NP as PCP - General (Nurse Practitioner) Mauro Kaufmann, RN as Oncology Nurse Navigator Rockwell Germany, RN as Oncology Nurse Navigator Stark Klein, MD as Consulting Physician (General Surgery) Yumalay Circle, Virgie Dad, MD as Consulting Physician (Oncology) Gery Pray, MD as Consulting Physician (Radiation Oncology) OTHER MD:   CHIEF COMPLAINT: Estrogen receptor positive breast cancer  CURRENT TREATMENT: Anastrozole   HISTORY OF CURRENT ILLNESS: From the original intake note:  Emily Hendricks had a right breast lump saometime in March 2020; she went to [facility] to be evaluated.  She underwent bilateral diagnostic mammography with tomography and right breast ultrasonography at [facility] on 06/29/2018 showing: Breast Density Category C. No dominant masses, suspicious calcifications or secondary signs of malignancy are identified within the LEFT breast.   There is a spiculated mass within the upper slightly inner RIGHT breast, at far posterior depth, measuring approximately 2.2 cm greatest dimension, corresponding to the area of clinical concern. No dominant masses, suspicious calcifications or secondary signs of malignancy are identified elsewhere within the RIGHT breast. . Targeted ultrasound is performed, showing an irregular hypoechoic mass in the RIGHT breast at the 12:30 o'clock axis, 8 cm from the nipple, measuring 2.7 x 1.7 x 1.9 cm. The mass abuts and may involve the pectoralis muscle. RIGHT axilla was evaluated with ultrasound showing no enlarged or morphologically abnormal lymph nodes.  Accordingly on 07/03/2018 she proceeded to biopsy of the right breast area in question. 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 Prognostic indicators significant for: estrogen receptor, 95% positive with strong staining intensity and progesterone receptor, 10% positive with weak staining intensity. Proliferation marker Ki67 at 5%. HER2 equivocal (2+) by immunohistochemistry but reportedly negative by fluorescent in situ hybridization.  The patient's subsequent history is as detailed below.   INTERVAL HISTORY: [name] returns today for follow-up and treatment of  her strogen receptor positive breast cancer accompanied by an interpreter.Marland Kitchen She was last seen here on 09/18/2018.   She completed radiation on 11/22/2018.   She went off anastrozole during radiation but restarted approximately 1 week ago.  She is having no side effects from it that she is aware of  There is no bone density screening on file.   Since her last visit here, she has not undergone any additional studies.    REVIEW OF SYSTEMS: [name] does her normal activities at home, which include cleaning, cooking, and taking care of family.  They are being as careful as they can regarding the pandemic.  Note that her son and daughter-in-law both work outside the home.  There are no children in the home however.  Detailed review of systems today was otherwise noncontributory   PAST MEDICAL HISTORY: Past Medical History:  Diagnosis Date  . Diabetes mellitus without complication ([place])   . Family history of cancer   . Hyperlipidemia   . Hypertension      PAST SURGICAL HISTORY: Past Surgical History:  Procedure Laterality Date  . BREAST LUMPECTOMY WITH RADIOACTIVE SEED AND SENTINEL LYMPH NODE BIOPSY Right 08/31/2018   Procedure: RIGHT BREAST LUMPECTOMY WITH RADIOACTIVE SEED AND SENTINEL LYMPH NODE BIOPSY;  Surgeon: Stark Klein, MD;  Location: [place];  Service: General;  Laterality: Right;  . NO PAST SURGERIES    .  TUBAL LIGATION    Tubal ligation in 1990 or 1991.    FAMILY HISTORY:  Family History  Problem Relation Age of Onset  . Cancer Mother 70       unknown cancer, in her lower abdomen  . Cancer Sister 11       unknown cancer in her lower abdomen  . Heart disease Brother   . Cancer Niece        unknown form of cancer   [name]'s father died from heart complications following a heart surgery about 45 years ago. Patients' mother died from unknown cancer in her 75's. The patient has 6 brothers and 1 sisters. Her sister had cancer of unknown type, but that was in her lower abdomen. Patient denies anyone else in her family having cancer.     GYNECOLOGIC HISTORY:  No LMP recorded. Patient is postmenopausal. 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 She moved here from [place], [place] in 2005. Her husband is partially disabled. At home with them is her mother-in-law, her son, and her daughter-in-law. Jessiah's son's name is [employer].  He is a Freight forwarder at [employer].  His wife is a Marine scientist.  The patient's daughter, Elliet Goodnow, lives in [place] with her husband  ADVANCED DIRECTIVES: In the absence of any documentation, [name]'s spouse, is her healthcare power of attorney. The appropriate documentation is provided on 07/12/2018 for her to review, notarize, and return and her earliest convenience.  The patient intends to name her daughter Zanayah Shadowens as her healthcare power of attorney.  She also has the daughter's phone number as her primary contact   HEALTH MAINTENANCE: Social History   Tobacco Use  . Smoking status: Never Smoker  . Smokeless tobacco: Never Used  Substance Use Topics  . Alcohol use: No  . Drug use: No    Colonoscopy:   PAP:   Bone density:   Allergies  Allergen Reactions  . Lisinopril Cough    Current Outpatient  Medications  Medication Sig Dispense Refill  . acetaminophen (TYLENOL) 500 MG tablet Take by mouth every 6 (six) hours as needed. Pt does not know dosage    . anastrozole (ARIMIDEX) 1 MG tablet Take 1 tablet (1 mg total) by mouth daily. 90 tablet 4  . Cholecalciferol (VITAMIN D) 2000 units tablet Take 1 tablet (2,000 Units total) by mouth daily. 30 tablet 5  . ibuprofen (ADVIL,MOTRIN) 600 MG tablet Take 1 tablet (600 mg total) by mouth every 8 (eight) hours as needed for headache. Take with food. 40 tablet 0  . losartan (COZAAR) 50 MG tablet Take 1 tablet (50 mg total) by mouth daily. 90 tablet 3  . metFORMIN (GLUCOPHAGE) 500 MG tablet TAKE 1 TABLET BY MOUTH TWICE DAILY WITH MEALS 60 tablet 3  . simvastatin (ZOCOR) 20 MG tablet TAKE 1 TABLET BY MOUTH DAILY. 30 tablet 2   No current facility-administered medications for this visit.      OBJECTIVE: Middle-aged [place] woman who appears stated age  63:   12/13/18 1100  BP: (!) 165/85  Pulse: 74  Resp: 18  Temp: 98.3 F (36.8 C)  SpO2: 100%   Wt Readings from Last 3 Encounters:  12/13/18 131 lb 14.4 oz (59.8 kg)  09/18/18 130 lb 2 oz (59  kg)  09/18/18 129 lb 8 oz (58.7 kg)   Body mass index is 24.92 kg/m.    ECOG FS:1 - Symptomatic but completely ambulatory  Ocular: Sclerae unicteric, pupils round and equal Ear-nose-throat: Wearing a mask Lymphatic: No cervical or supraclavicular adenopathy Lungs no rales or rhonchi Heart regular rate and rhythm Abd soft, nontender, positive bowel sounds MSK no focal spinal tenderness, no joint edema Neuro: non-focal, well-oriented, appropriate affect Breasts: The right breast has undergone lumpectomy and radiation.  There has been some dry desquamation which is resolving.  There is some hyperpigmentation.  The incisions have healed very nicely and the cosmetic result is good.  Left breast is benign.  Both axillae are benign.   LAB RESULTS:  CMP     Component Value Date/Time    NA 142 09/18/2018 1036   NA 142 06/06/2018 1639   K 4.4 09/18/2018 1036   CL 107 09/18/2018 1036   CO2 27 09/18/2018 1036   GLUCOSE 92 09/18/2018 1036   BUN 14 09/18/2018 1036   BUN 15 06/06/2018 1639   CREATININE 0.81 09/18/2018 1036   CREATININE 0.83 07/12/2018 0848   CREATININE 0.78 02/03/2016 1613   CALCIUM 9.0 09/18/2018 1036   PROT 7.3 09/18/2018 1036   PROT 6.5 06/06/2018 1639   ALBUMIN 4.0 09/18/2018 1036   ALBUMIN 4.3 06/06/2018 1639   AST 14 (L) 09/18/2018 1036   AST 17 07/12/2018 0848   ALT 11 09/18/2018 1036   ALT 12 07/12/2018 0848   ALKPHOS 82 09/18/2018 1036   BILITOT 0.4 09/18/2018 1036   BILITOT 0.5 07/12/2018 0848   GFRNONAA >60 09/18/2018 1036   GFRNONAA >60 07/12/2018 0848   GFRNONAA 82 02/03/2016 1613   GFRAA >60 09/18/2018 1036   GFRAA >60 07/12/2018 0848   GFRAA >89 02/03/2016 1613    No results found for: TOTALPROTELP, ALBUMINELP, A1GS, A2GS, BETS, BETA2SER, GAMS, MSPIKE, SPEI  No results found for: KPAFRELGTCHN, LAMBDASER, KAPLAMBRATIO  Lab Results  Component Value Date   WBC 3.1 (L) 12/13/2018   NEUTROABS 2.1 12/13/2018   HGB 11.8 (L) 12/13/2018   HCT 37.6 12/13/2018   MCV 84.5 12/13/2018   PLT 180 12/13/2018    '@LASTCHEMISTRY'$ @  No results found for: LABCA2  No components found for: [id number]  No results for input(s): INR in the last 168 hours.  No results found for: LABCA2  No results found for: HGD924  No results found for: QAS341  No results found for: DQQ229  No results found for: CA2729  No components found for: HGQUANT  No results found for: CEA1 / No results found for: CEA1   No results found for: AFPTUMOR  No results found for: CHROMOGRNA  No results found for: PSA1  Appointment on 12/13/2018  Component Date Value Ref Range Status  . WBC 12/13/2018 3.1* 4.0 - 10.5 K/uL Final  . RBC 12/13/2018 4.45  3.87 - 5.11 MIL/uL Final  . Hemoglobin 12/13/2018 11.8* 12.0 - 15.0 g/dL Final  . HCT 12/13/2018 37.6  36.0 -  46.0 % Final  . MCV 12/13/2018 84.5  80.0 - 100.0 fL Final  . MCH 12/13/2018 26.5  26.0 - 34.0 pg Final  . MCHC 12/13/2018 31.4  30.0 - 36.0 g/dL Final  . RDW 12/13/2018 13.1  11.5 - 15.5 % Final  . Platelets 12/13/2018 180  150 - 400 K/uL Final  . nRBC 12/13/2018 0.0  0.0 - 0.2 % Final  . Neutrophils Relative % 12/13/2018 67  % Final  . Neutro  Abs 12/13/2018 2.1  1.7 - 7.7 K/uL Final  . Lymphocytes Relative 12/13/2018 20  % Final  . Lymphs Abs 12/13/2018 0.6* 0.7 - 4.0 K/uL Final  . Monocytes Relative 12/13/2018 9  % Final  . Monocytes Absolute 12/13/2018 0.3  0.1 - 1.0 K/uL Final  . Eosinophils Relative 12/13/2018 4  % Final  . Eosinophils Absolute 12/13/2018 0.1  0.0 - 0.5 K/uL Final  . Basophils Relative 12/13/2018 0  % Final  . Basophils Absolute 12/13/2018 0.0  0.0 - 0.1 K/uL Final  . Immature Granulocytes 12/13/2018 0  % Final  . Abs Immature Granulocytes 12/13/2018 0.00  0.00 - 0.07 K/uL Final   Performed at [facility], [place] [address] [place], [place] [address]    (this displays the last labs from the last 3 days)  No results found for: TOTALPROTELP, ALBUMINELP, A1GS, A2GS, BETS, BETA2SER, GAMS, MSPIKE, SPEI (this displays SPEP labs)  No results found for: KPAFRELGTCHN, LAMBDASER, KAPLAMBRATIO (kappa/lambda light chains)  No results found for: HGBA, HGBA2QUANT, HGBFQUANT, HGBSQUAN (Hemoglobinopathy evaluation)   No results found for: LDH  No results found for: IRON, TIBC, IRONPCTSAT (Iron and TIBC)  No results found for: FERRITIN  Urinalysis No results found for: COLORURINE, APPEARANCEUR, LABSPEC, PHURINE, GLUCOSEU, HGBUR, BILIRUBINUR, KETONESUR, PROTEINUR, UROBILINOGEN, NITRITE, LEUKOCYTESUR   STUDIES:  No results found.   ELIGIBLE FOR AVAILABLE RESEARCH PROTOCOL: no   ASSESSMENT: 63 y.o. [place] woman status post right breast biopsy 07/03/2018 for a clinical T2 N0, stage IB invasive ductal carcinoma, grade 2,  estrogen receptor strongly positive, progesterone receptor weakly positive, with no HER-2 amplification and an MIB-1 of 5%.  (1) neoadjuvant anastrozole started 07/12/2018, interrupted during radiation, resumed 11/28/2018  (a) bone density  (2) status post right lumpectomy and sentinel lymph node sampling 08/31/2018 for a pT2 pN0, stage IB invasive ductal carcinoma, grade 2, with close but negative margins  (a) a total of 3 sentinel lymph nodes removed  (3) MammaPrint shows a luminal type low risk tumor, predicting a 97.8% chance of being alive without distant recurrence at 5 years with antiestrogen therapy alone [without chemotherapy]  (4) adjuvant radiation completed 11/22/2018  (5) Genetic testing on 07/12/2018 through the Common Hereditary Gene Panel offered by [facility] found no pathogenic mutations in APC, ATM, AXIN2, BARD1, BMPR1A, BRCA1, BRCA2, BRIP1, CDH1, CDK4, CDKN2A (p14ARF), CDKN2A (p16INK4a), CHEK2, CTNNA1, DICER1, EPCAM (Deletion/duplication testing only), GREM1 (promoter region deletion/duplication testing only), KIT, MEN1, MLH1, MSH2, MSH3, MSH6, MUTYH, NBN, NF1, NHTL1, PALB2, PDGFRA, PMS2, POLD1, POLE, PTEN, RAD50, RAD51C, RAD51D, RNF43, SDHB, SDHC, SDHD, SMAD4, SMARCA4. STK11, TP53, TSC1, TSC2, and VHL.  The following genes were evaluated for sequence changes only: SDHA and HOXB13 c.251G>A variant only.   PLAN:  Carron has recovered well from her radiation treatments.  She is tolerating anastrozole well.  The plan will be to continue that a total of 5 years.  She will have her next mammogram in April 2021 at the breast center and I have set her up for a DEXA scan at the same time.  She will return to see me shortly after that.  I have reviewed pandemic precautions with her and reinforced what they are already doing  I have encouraged her to call us with any questions or concerns that she may have.   Abdulahad Mederos, Virgie Dad, MD  12/13/18 11:18 AM Medical Oncology and  Hematology [facility] [place], [place] [address] Tel. [phone]  Fax. [phone]  I, Jacqualyn Posey am acting as a Education administrator for Chauncey Cruel, MD.   I, Lurline Del MD, have reviewed the above documentation for accuracy and completeness, and I agree with the above.

## 2018-12-13 ENCOUNTER — Other Ambulatory Visit: Payer: Self-pay

## 2018-12-13 ENCOUNTER — Inpatient Hospital Stay: Payer: Medicaid Other

## 2018-12-13 ENCOUNTER — Inpatient Hospital Stay: Payer: Medicaid Other | Attending: Oncology | Admitting: Oncology

## 2018-12-13 VITALS — BP 165/85 | HR 74 | Temp 98.3°F | Resp 18 | Ht 61.0 in | Wt 131.9 lb

## 2018-12-13 DIAGNOSIS — Z79811 Long term (current) use of aromatase inhibitors: Secondary | ICD-10-CM | POA: Diagnosis not present

## 2018-12-13 DIAGNOSIS — Z170 Estrogen receptor positive status [ER+]: Secondary | ICD-10-CM

## 2018-12-13 DIAGNOSIS — Z923 Personal history of irradiation: Secondary | ICD-10-CM | POA: Insufficient documentation

## 2018-12-13 DIAGNOSIS — E119 Type 2 diabetes mellitus without complications: Secondary | ICD-10-CM | POA: Insufficient documentation

## 2018-12-13 DIAGNOSIS — C50211 Malignant neoplasm of upper-inner quadrant of right female breast: Secondary | ICD-10-CM | POA: Diagnosis not present

## 2018-12-13 DIAGNOSIS — I10 Essential (primary) hypertension: Secondary | ICD-10-CM | POA: Diagnosis not present

## 2018-12-13 DIAGNOSIS — Z1379 Encounter for other screening for genetic and chromosomal anomalies: Secondary | ICD-10-CM | POA: Diagnosis not present

## 2018-12-13 DIAGNOSIS — E785 Hyperlipidemia, unspecified: Secondary | ICD-10-CM | POA: Diagnosis not present

## 2018-12-13 LAB — COMPREHENSIVE METABOLIC PANEL
ALT: 10 U/L (ref 0–44)
AST: 15 U/L (ref 15–41)
Albumin: 3.8 g/dL (ref 3.5–5.0)
Alkaline Phosphatase: 92 U/L (ref 38–126)
Anion gap: 7 (ref 5–15)
BUN: 15 mg/dL (ref 8–23)
CO2: 26 mmol/L (ref 22–32)
Calcium: 9.1 mg/dL (ref 8.9–10.3)
Chloride: 108 mmol/L (ref 98–111)
Creatinine, Ser: 0.75 mg/dL (ref 0.44–1.00)
GFR calc Af Amer: >60 mL/min (ref >60)
GFR calc non Af Amer: >60 mL/min (ref >60)
Glucose, Bld: 91 mg/dL (ref 70–99)
Potassium: 4 mmol/L (ref 3.5–5.1)
Sodium: 141 mmol/L (ref 135–145)
Total Bilirubin: 0.4 mg/dL (ref 0.3–1.2)
Total Protein: 7 g/dL (ref 6.5–8.1)

## 2018-12-13 LAB — CBC WITH DIFFERENTIAL/PLATELET
Abs Immature Granulocytes: 0 10*3/uL (ref 0.00–0.07)
Basophils Absolute: 0 10*3/uL (ref 0.0–0.1)
Basophils Relative: 0 %
Eosinophils Absolute: 0.1 10*3/uL (ref 0.0–0.5)
Eosinophils Relative: 4 %
HCT: 37.6 % (ref 36.0–46.0)
Hemoglobin: 11.8 g/dL — ABNORMAL LOW (ref 12.0–15.0)
Immature Granulocytes: 0 %
Lymphocytes Relative: 20 %
Lymphs Abs: 0.6 10*3/uL — ABNORMAL LOW (ref 0.7–4.0)
MCH: 26.5 pg (ref 26.0–34.0)
MCHC: 31.4 g/dL (ref 30.0–36.0)
MCV: 84.5 fL (ref 80.0–100.0)
Monocytes Absolute: 0.3 10*3/uL (ref 0.1–1.0)
Monocytes Relative: 9 %
Neutro Abs: 2.1 10*3/uL (ref 1.7–7.7)
Neutrophils Relative %: 67 %
Platelets: 180 10*3/uL (ref 150–400)
RBC: 4.45 MIL/uL (ref 3.87–5.11)
RDW: 13.1 % (ref 11.5–15.5)
WBC: 3.1 10*3/uL — ABNORMAL LOW (ref 4.0–10.5)
nRBC: 0 % (ref 0.0–0.2)

## 2018-12-14 ENCOUNTER — Telehealth: Payer: Self-pay | Admitting: Oncology

## 2018-12-14 NOTE — Telephone Encounter (Signed)
I talk with patients daughter regarding schedule  °

## 2018-12-15 ENCOUNTER — Encounter: Payer: Self-pay | Admitting: *Deleted

## 2018-12-25 ENCOUNTER — Ambulatory Visit
Admission: RE | Admit: 2018-12-25 | Discharge: 2018-12-25 | Disposition: A | Discharge status: Home / Self Care | Payer: Medicaid Other | Source: Ambulatory Visit | Attending: Radiation Oncology | Admitting: Radiation Oncology

## 2018-12-25 ENCOUNTER — Other Ambulatory Visit: Payer: Self-pay

## 2018-12-25 ENCOUNTER — Encounter: Payer: Self-pay | Admitting: Radiation Oncology

## 2018-12-25 VITALS — BP 141/89 | HR 79 | Temp 98.7°F | Resp 18 | Ht 61.0 in | Wt 132.2 lb

## 2018-12-25 DIAGNOSIS — Z79899 Other long term (current) drug therapy: Secondary | ICD-10-CM | POA: Diagnosis not present

## 2018-12-25 DIAGNOSIS — Z79811 Long term (current) use of aromatase inhibitors: Secondary | ICD-10-CM | POA: Insufficient documentation

## 2018-12-25 DIAGNOSIS — Z7984 Long term (current) use of oral hypoglycemic drugs: Secondary | ICD-10-CM | POA: Insufficient documentation

## 2018-12-25 DIAGNOSIS — C50211 Malignant neoplasm of upper-inner quadrant of right female breast: Secondary | ICD-10-CM | POA: Diagnosis present

## 2018-12-25 DIAGNOSIS — Z923 Personal history of irradiation: Secondary | ICD-10-CM | POA: Insufficient documentation

## 2018-12-25 DIAGNOSIS — Z170 Estrogen receptor positive status [ER+]: Secondary | ICD-10-CM | POA: Insufficient documentation

## 2018-12-25 NOTE — Patient Instructions (Signed)
Coronavirus (COVID-19) Are you at risk?  Are you at risk for the Coronavirus (COVID-19)?  To be considered HIGH RISK for Coronavirus (COVID-19), you have to meet the following criteria:  . Traveled to [place], [place], [place], [place] or [place]; or in the [place] to [place], [place], [place], or [place]; and have fever, cough, and shortness of breath within the last 2 weeks of travel OR . Been in close contact with a person diagnosed with COVID-19 within the last 2 weeks and have fever, cough, and shortness of breath . IF YOU DO NOT MEET THESE CRITERIA, YOU ARE CONSIDERED LOW RISK FOR COVID-19.  What to do if you are HIGH RISK for COVID-19?  . If you are having a medical emergency, call 911. . Seek medical care right away. Before you go to a doctor's office, urgent care or emergency department, call ahead and tell them about your recent travel, contact with someone diagnosed with COVID-19, and your symptoms. You should receive instructions from your physician's office regarding next steps of care.  . When you arrive at healthcare provider, tell the healthcare staff immediately you have returned from visiting [place], [place], [place], [place] or [place]; or traveled in the [place] to [place], [place], [place], or [place]; in the last two weeks or you have been in close contact with a person diagnosed with COVID-19 in the last 2 weeks.   . Tell the health care staff about your symptoms: fever, cough and shortness of breath. . After you have been seen by a medical provider, you will be either: o Tested for (COVID-19) and discharged home on quarantine except to seek medical care if symptoms worsen, and asked to  - Stay home and avoid contact with others until you get your results (4-5 days)  - Avoid travel on public transportation if possible (such as bus, train, or airplane) or o Sent to the Emergency Department by EMS for evaluation, COVID-19 testing, and possible  admission depending on your condition and test results.  What to do if you are LOW RISK for COVID-19?  Reduce your risk of any infection by using the same precautions used for avoiding the common cold or flu:  . Wash your hands often with soap and warm water for at least 20 seconds.  If soap and water are not readily available, use an alcohol-based hand sanitizer with at least 60% alcohol.  . If coughing or sneezing, cover your mouth and nose by coughing or sneezing into the elbow areas of your shirt or coat, into a tissue or into your sleeve (not your hands). . Avoid shaking hands with others and consider head nods or verbal greetings only. . Avoid touching your eyes, nose, or mouth with unwashed hands.  . Avoid close contact with people who are sick. . Avoid places or events with large numbers of people in one location, like concerts or sporting events. . Carefully consider travel plans you have or are making. . If you are planning any travel outside or inside the US, visit the CDC's Travelers' Health webpage for the latest health notices. . If you have some symptoms but not all symptoms, continue to monitor at home and seek medical attention if your symptoms worsen. . If you are having a medical emergency, call 911.   ADDITIONAL HEALTHCARE OPTIONS FOR PATIENTS  [facility] / e-Visit: [email or web address]         [facility]: [phone]  [facility]: [phone]                   [facility]: [phone]   

## 2018-12-25 NOTE — Progress Notes (Signed)
Radiation Oncology         [phone] ________________________________  Name: Emily Hendricks MRN: [id number]  Date: 12/25/2018  DOB: 01-10-1956  Follow-Up Visit Note  CC: Emily Pounds, NP  Emily Pounds, NP    ICD-10-CM   1. Malignant neoplasm of upper-inner quadrant of right breast in female, estrogen receptor positive ([place])  C50.211    Z17.0     Diagnosis:   Stage IB (pT2, pN9m) Right Breast IDC, ER+ /Ysidro Evert/Her2-, grade 2  Interval Since Last Radiation:  1 month   10/05/2018 through 11/22/2018 Site Technique Total Dose Dose per Fx Completed Fx Beam Energies  Breast: Breast_Rt 3D 50.4/50.4 1.8 28/28 6X  Breast: Breast_Rt_axilla 3D 45/45 1.8 25/25 6X, 10X  Breast: Breast_Rt_Bst 3D 12/12 2 6/6 6X, 10X    Narrative:  The patient returns today for routine follow-up. She last saw Dr. MJana Hakimon 12/13/2018. She is taking anastrozole and tolerating it well. [employer] interpreter was used today, as the patient's native language is Hindi.  On review of systems, she reports occasional left chest pain, which is moderately relieved with tylenol.  No shortness of breath or breathing problems.  ALLERGIES:  is allergic to lisinopril.  Meds: Current Outpatient Medications  Medication Sig Dispense Refill  . acetaminophen (TYLENOL) 500 MG tablet Take by mouth every 6 (six) hours as needed. Pt does not know dosage    . anastrozole (ARIMIDEX) 1 MG tablet Take 1 tablet (1 mg total) by mouth daily. 90 tablet 4  . Cholecalciferol (VITAMIN D) 2000 units tablet Take 1 tablet (2,000 Units total) by mouth daily. 30 tablet 5  . ibuprofen (ADVIL,MOTRIN) 600 MG tablet Take 1 tablet (600 mg total) by mouth every 8 (eight) hours as needed for headache. Take with food. 40 tablet 0  . losartan (COZAAR) 50 MG tablet Take 1 tablet (50 mg total) by mouth daily. 90 tablet 3  . metFORMIN (GLUCOPHAGE) 500 MG tablet TAKE 1 TABLET BY MOUTH TWICE DAILY WITH MEALS 60 tablet 3  . simvastatin (ZOCOR) 20 MG tablet  TAKE 1 TABLET BY MOUTH DAILY. 30 tablet 2   No current facility-administered medications for this encounter.     Physical Findings: The patient is in no acute distress. Patient is alert and oriented.  height is 5' 1"  (1.549 m) and weight is 132 lb 4 oz (60 kg). Her temporal temperature is 98.7 F (37.1 C). Her blood pressure is 141/89 (abnormal) and her pulse is 79. Her respiration is 18 and oxygen saturation is 100%. .  No significant changes. Lungs are clear to auscultation bilaterally. Heart has regular rate and rhythm. No palpable cervical, supraclavicular, or axillary adenopathy. Abdomen soft, non-tender, normal bowel sounds. Left breast: Breast: no palpable mass, nipple discharge or bleeding. Right breast: Skin is healed well.  Mild hyperpigmentation changes noted.  No palpable mass nipple discharge or bleeding.  Lab Findings: Lab Results  Component Value Date   WBC 3.1 (L) 12/13/2018   HGB 11.8 (L) 12/13/2018   HCT 37.6 12/13/2018   MCV 84.5 12/13/2018   PLT 180 12/13/2018    Radiographic Findings: No results found.  Impression:  The patient is recovering from the effects of radiation.   No evidence of recurrence on clinical exam today.  Patient skin is healed well.  Plan: PRN follow-up in radiation oncology.  The patient will continue follow-up with medical oncology and remain on Arimidex.  ____________________________________ JGery Pray MD   This document serves as a record of services  personally performed by Gery Pray, MD. It was created on his behalf by Wilburn Mylar, a trained medical scribe. The creation of this record is based on the scribe's personal observations and the provider's statements to them. This document has been checked and approved by the attending provider.

## 2018-12-25 NOTE — Progress Notes (Signed)
Pt presents today for f/u with Dr. Sondra Hendricks. Pt called daughter who states pt will need interpreter. [employer] interpreter used, language is [place] derivative. Pt reports occasional left chest pain and is using Tylenol with moderate relief. Pt reports using skin care cream that was provided during radiation twice daily. Via interpreter, pt was instructed to use moisturizer of choice when that skin cream is used completely. Breast is slightly hyperpigmented.   BP (!) 141/89 (BP Location: Left Arm, Patient Position: Sitting)   Pulse 79   Temp 98.7 F (37.1 C) (Temporal)   Resp 18   Ht 5\' 1"  (1.549 m)   Wt 132 lb 4 oz (60 kg)   SpO2 100%   BMI 24.99 kg/m   Wt Readings from Last 3 Encounters:  12/25/18 132 lb 4 oz (60 kg)  12/13/18 131 lb 14.4 oz (59.8 kg)  09/18/18 130 lb 2 oz (59 kg)   Loma Sousa, RN BSN

## 2018-12-25 NOTE — Progress Notes (Signed)
  Patient Name: Emily Hendricks MRN: [id number] DOB: [birth year] Referring Physician: Geryl Rankins Date of Service: 11/22/2018 [facility], [place]                                                        End Of Treatment Note  Diagnoses: C50.211-Malignant neoplasm of upper-inner quadrant of right female breast  Cancer Staging: Stage IB (pT2 pN0) invasive ductal carcinoma, grade 2  Intent: Curative  Radiation Treatment Dates: 10/05/2018 through 11/22/2018 Site Technique Total Dose Dose per Fx Completed Fx Beam Energies  Breast: Breast_Rt 3D 50.4/50.4 1.8 28/28 6X  Breast: Breast_Rt_axilla 3D 45/45 1.8 25/25 6X, 10X  Breast: Breast_Rt_Bst 3D 12/12 2 6/6 6X, 10X   Narrative: The patient tolerated radiation therapy relatively well. She reported some itching and burning to her skin, as well as burning inside the breast. She experienced some skin breakdown to the supraclavicular area. She endorsed using neosporin and this resolved the following week.  Plan: The patient will follow-up with radiation oncology in 1 month.  ________________________________________________   Blair Promise, PhD, MD  This document serves as a record of services personally performed by Gery Pray, MD. It was created on his behalf by Wilburn Mylar, a trained medical scribe. The creation of this record is based on the scribe's personal observations and the provider's statements to them. This document has been checked and approved by the attending provider.

## 2019-02-01 ENCOUNTER — Telehealth: Payer: Self-pay | Admitting: Nurse Practitioner

## 2019-02-01 NOTE — Telephone Encounter (Signed)
Patient needs an appointment

## 2019-02-01 NOTE — Telephone Encounter (Signed)
1) Medication(s) Requested (by name):Cholecalciferol (VITAMIN D) 2000 units tablet losartan (COZAAR) 50 MG tablet metFORMIN (GLUCOPHAGE) 500 MG tablet  simvastatin (ZOCOR) 20 MG tablet    2) Pharmacy of Choice: [facility]   3) Special Requests:   Approved medications will be sent to the pharmacy, we will reach out if there is an issue.  Requests made after 3pm may not be addressed until the following business day!  If a patient is unsure of the name of the medication(s) please note and ask patient to call back when they are able to provide all info, do not send to responsible party until all information is available!

## 2019-03-08 ENCOUNTER — Other Ambulatory Visit: Payer: Self-pay | Admitting: Family Medicine

## 2019-03-08 ENCOUNTER — Inpatient Hospital Stay: Payer: Medicaid Other | Attending: Oncology | Admitting: Adult Health

## 2019-03-08 ENCOUNTER — Other Ambulatory Visit: Payer: Self-pay | Admitting: Nurse Practitioner

## 2019-03-08 DIAGNOSIS — Z170 Estrogen receptor positive status [ER+]: Secondary | ICD-10-CM | POA: Insufficient documentation

## 2019-03-08 DIAGNOSIS — E785 Hyperlipidemia, unspecified: Secondary | ICD-10-CM

## 2019-03-08 DIAGNOSIS — R7303 Prediabetes: Secondary | ICD-10-CM

## 2019-03-08 DIAGNOSIS — C50211 Malignant neoplasm of upper-inner quadrant of right female breast: Secondary | ICD-10-CM | POA: Insufficient documentation

## 2019-03-13 ENCOUNTER — Other Ambulatory Visit: Payer: Self-pay | Admitting: Nurse Practitioner

## 2019-03-13 ENCOUNTER — Telehealth (HOSPITAL_BASED_OUTPATIENT_CLINIC_OR_DEPARTMENT_OTHER): Payer: Medicaid Other | Admitting: Nurse Practitioner

## 2019-03-13 DIAGNOSIS — B353 Tinea pedis: Secondary | ICD-10-CM

## 2019-03-13 DIAGNOSIS — R7303 Prediabetes: Secondary | ICD-10-CM

## 2019-03-13 MED ORDER — TRIAMCINOLONE ACETONIDE 0.5 % EX CREA: 1.0000 "application " | TOPICAL_CREAM | Freq: Two times a day (BID) | CUTANEOUS | 0 refills | Status: DC

## 2019-03-13 NOTE — Progress Notes (Signed)
Assessment & Plan:  Diagnoses and all orders for this visit:  Tinea pedis of both feet -     Ambulatory referral to Dermatology -     triamcinolone cream (KENALOG) 0.5 %; Apply 1 application topically 2 (two) times daily.    Patient has been counseled on age-appropriate routine health concerns for screening and prevention. These are reviewed and up-to-date. Referrals have been placed accordingly. Immunizations are up-to-date or declined.    Subjective:  HPI Emily Hendricks 63 y.o. female presents for video visit with complaints of bilateral foot rash.  Tinea: Patient complains of probable tinea. Lesions are located on the bilateral feet. Symptoms include cracked, fissured skin with erythema located between the toes and patches of scaly skin. Symptoms have been ongoing for about a few months. Previous evaluation and treatment has been none.    Review of Systems  Constitutional: Negative for fever, malaise/fatigue and weight loss.  HENT: Negative.  Negative for nosebleeds.   Eyes: Negative.  Negative for blurred vision, double vision and photophobia.  Respiratory: Negative.  Negative for cough and shortness of breath.   Cardiovascular: Negative.  Negative for chest pain, palpitations and leg swelling.  Gastrointestinal: Negative.  Negative for heartburn, nausea and vomiting.  Musculoskeletal: Negative.  Negative for myalgias.  Skin: Positive for itching and rash.       SEE HPI  Neurological: Negative.  Negative for dizziness, focal weakness, seizures and headaches.  Psychiatric/Behavioral: Negative.  Negative for suicidal ideas.    Past Medical History:  Diagnosis Date  . Diabetes mellitus without complication ([place])   . Family history of cancer   . Hyperlipidemia   . Hypertension     Past Surgical History:  Procedure Laterality Date  . BREAST LUMPECTOMY WITH RADIOACTIVE SEED AND SENTINEL LYMPH NODE BIOPSY Right 08/31/2018   Procedure: RIGHT BREAST LUMPECTOMY WITH  RADIOACTIVE SEED AND SENTINEL LYMPH NODE BIOPSY;  Surgeon: Stark Klein, MD;  Location: [facility];  Service: General;  Laterality: Right;  . NO PAST SURGERIES    . TUBAL LIGATION      Family History  Problem Relation Age of Onset  . Cancer Mother 97       unknown cancer, in her lower abdomen  . Cancer Sister 15       unknown cancer in her lower abdomen  . Heart disease Brother   . Cancer Niece        unknown form of cancer    Social History Reviewed with no changes to be made today.   Outpatient Medications Prior to Visit  Medication Sig Dispense Refill  . acetaminophen (TYLENOL) 500 MG tablet Take by mouth every 6 (six) hours as needed. Pt does not know dosage    . anastrozole (ARIMIDEX) 1 MG tablet Take 1 tablet (1 mg total) by mouth daily. 90 tablet 4  . Cholecalciferol (VITAMIN D) 2000 units tablet Take 1 tablet (2,000 Units total) by mouth daily. 30 tablet 5  . ibuprofen (ADVIL,MOTRIN) 600 MG tablet Take 1 tablet (600 mg total) by mouth every 8 (eight) hours as needed for headache. Take with food. 40 tablet 0  . losartan (COZAAR) 50 MG tablet Take 1 tablet (50 mg total) by mouth daily. 90 tablet 3  . metFORMIN (GLUCOPHAGE) 500 MG tablet TAKE 1 TABLET BY MOUTH TWICE DAILY WITH MEALS 60 tablet 3  . simvastatin (ZOCOR) 20 MG tablet TAKE ONE TABLET BY MOUTH DAILY 30 tablet 2   No facility-administered medications prior to visit.  Allergies  Allergen Reactions  . Lisinopril Cough       Objective:    There were no vitals taken for this visit. Wt Readings from Last 3 Encounters:  12/25/18 132 lb 4 oz (60 kg)  12/13/18 131 lb 14.4 oz (59.8 kg)  09/18/18 130 lb 2 oz (59 kg)    Physical Exam Musculoskeletal:        General: Normal range of motion.  Skin:    Findings: Erythema and rash present. No abscess or signs of injury. Rash is crusting and macular. Rash is not vesicular.       Neurological:     Mental Status: She is alert and oriented to  person, place, and time.          Patient has been counseled extensively about nutrition and exercise as well as the importance of adherence with medications and regular follow-up. The patient was given clear instructions to go to ER or return to medical center if symptoms don't improve, worsen or new problems develop. The patient verbalized understanding.   Follow-up: Return in about 3 weeks (around 04/03/2019) for RASH.   Gildardo Pounds, FNP-BC [facility] [place], [place]   03/17/2019, 8:57 PM

## 2019-03-17 ENCOUNTER — Encounter: Payer: Self-pay | Admitting: Nurse Practitioner

## 2019-03-24 ENCOUNTER — Other Ambulatory Visit: Payer: Self-pay | Admitting: Nurse Practitioner

## 2019-03-24 DIAGNOSIS — R7303 Prediabetes: Secondary | ICD-10-CM

## 2019-03-27 ENCOUNTER — Encounter: Payer: Self-pay | Admitting: *Deleted

## 2019-04-09 ENCOUNTER — Other Ambulatory Visit: Payer: Self-pay

## 2019-04-09 ENCOUNTER — Encounter: Payer: Self-pay | Admitting: Family

## 2019-04-09 ENCOUNTER — Ambulatory Visit: Payer: Medicaid Other | Attending: Family | Admitting: Family

## 2019-04-09 VITALS — BP 132/79 | HR 92 | Temp 98.5°F | Resp 16 | Wt 134.8 lb

## 2019-04-09 DIAGNOSIS — E119 Type 2 diabetes mellitus without complications: Secondary | ICD-10-CM | POA: Insufficient documentation

## 2019-04-09 DIAGNOSIS — C50211 Malignant neoplasm of upper-inner quadrant of right female breast: Secondary | ICD-10-CM | POA: Insufficient documentation

## 2019-04-09 DIAGNOSIS — Z79811 Long term (current) use of aromatase inhibitors: Secondary | ICD-10-CM | POA: Diagnosis not present

## 2019-04-09 DIAGNOSIS — Z79899 Other long term (current) drug therapy: Secondary | ICD-10-CM | POA: Insufficient documentation

## 2019-04-09 DIAGNOSIS — Z888 Allergy status to other drugs, medicaments and biological substances status: Secondary | ICD-10-CM | POA: Diagnosis not present

## 2019-04-09 DIAGNOSIS — E785 Hyperlipidemia, unspecified: Secondary | ICD-10-CM | POA: Diagnosis not present

## 2019-04-09 DIAGNOSIS — Z170 Estrogen receptor positive status [ER+]: Secondary | ICD-10-CM | POA: Insufficient documentation

## 2019-04-09 DIAGNOSIS — Z7984 Long term (current) use of oral hypoglycemic drugs: Secondary | ICD-10-CM | POA: Insufficient documentation

## 2019-04-09 DIAGNOSIS — N950 Postmenopausal bleeding: Secondary | ICD-10-CM | POA: Diagnosis not present

## 2019-04-09 DIAGNOSIS — Z8249 Family history of ischemic heart disease and other diseases of the circulatory system: Secondary | ICD-10-CM | POA: Diagnosis not present

## 2019-04-09 DIAGNOSIS — N939 Abnormal uterine and vaginal bleeding, unspecified: Secondary | ICD-10-CM | POA: Diagnosis present

## 2019-04-09 DIAGNOSIS — I10 Essential (primary) hypertension: Secondary | ICD-10-CM | POA: Insufficient documentation

## 2019-04-09 NOTE — Progress Notes (Signed)
Pt daughter states bleeding started on Thursday and it lasted for a day  Pt denies cramping

## 2019-04-09 NOTE — Progress Notes (Signed)
Patient ID: Emily Hendricks, female    DOB: 1955-03-30  MRN: [id number]  CC: Vaginal Bleeding  Subjective: Emily Hendricks is a 64 y.o. female with past medical history of hypertension, diabetes, hyperlipidemia, and malignant neoplasm right breast, who presents for vaginal bleeding. Accompanied by [name], patient's daughter and advocate.  1. VAGINAL BLEEDING:   Called daughter on Thursday April 05, 2019 around 7:30 am because patient noticed bleeding. Blood was in pants, pajamas, on bed, and carpet. Went to take shower and started bleeding a lot. Described as bright red blood. Bleeding not painful. Daughter told her to put on pad. The last time patient had bleeding was on Thursday April 05, 2019 at 8:30 am. No bleeding since. No periods for at least 86 years, age 11 years old was when last period occurred. Sometimes will have a nose bleed can not recall how often but states it last less than 5 minutes. Describes nose bleed as a smear when she wipes her nose. Denies family history of bleeding. No previous bleeding history. Patient's mother had a reproductive cancer but she can't recall what it was because she was a little girl when it happened. Denies smoking. Denies bruising. Denies blood in stool and urine. Only takes Ibuprofen when she needs it, taken last 3 days ago. Lumpectomy is last previous surgery. Has been over 1 year since last saw gynecologist. Denies hormone therapy. Denies incontinence and bladder change. Denies fever. Denies chest pain. Denies palpitations. Denies shortness of breath. Denies trauma. Admits headaches in the morning lasting half a day and then goes away, with Ibuprofen. Vaginal Bleeding: Patient complains of vaginal bleeding. She has been menopausal for 20 year. Currently on no HRT and has been on this regimen for 0 year. Bleeding is described as flow about like a period and has occurred 1 times. Other menopausal symptoms: none. Workup to date: CBC.  Patient  Active Problem List   Diagnosis Date Noted  . Genetic testing 07/24/2018  . Family history of cancer   . Malignant neoplasm of upper-inner quadrant of right breast in female, estrogen receptor positive ([place]) 07/06/2018     Current Outpatient Medications on File Prior to Visit  Medication Sig Dispense Refill  . acetaminophen (TYLENOL) 500 MG tablet Take by mouth every 6 (six) hours as needed. Pt does not know dosage    . anastrozole (ARIMIDEX) 1 MG tablet Take 1 tablet (1 mg total) by mouth daily. 90 tablet 4  . Cholecalciferol (VITAMIN D) 2000 units tablet Take 1 tablet (2,000 Units total) by mouth daily. 30 tablet 5  . ibuprofen (ADVIL,MOTRIN) 600 MG tablet Take 1 tablet (600 mg total) by mouth every 8 (eight) hours as needed for headache. Take with food. 40 tablet 0  . losartan (COZAAR) 50 MG tablet Take 1 tablet (50 mg total) by mouth daily. 90 tablet 3  . metFORMIN (GLUCOPHAGE) 500 MG tablet TAKE ONE TABLET BY MOUTH TWICE A DAY WITH MEALS 60 tablet 3  . simvastatin (ZOCOR) 20 MG tablet TAKE ONE TABLET BY MOUTH DAILY 30 tablet 2  . triamcinolone cream (KENALOG) 0.5 % Apply 1 application topically 2 (two) times daily. 60 g 0   No current facility-administered medications on file prior to visit.    Allergies  Allergen Reactions  . Lisinopril Cough    Social History   Socioeconomic History  . Marital status: Married    Spouse name: Not on file  . Number of children: 2  . Years of education: Not  on file  . Highest education level: 12th grade  Occupational History  . Not on file  Tobacco Use  . Smoking status: Never Smoker  . Smokeless tobacco: Never Used  Substance and Sexual Activity  . Alcohol use: No  . Drug use: No  . Sexual activity: Never  Other Topics Concern  . Not on file  Social History Narrative  . Not on file   Social Determinants of Health   Financial Resource Strain:   . Difficulty of Paying Living Expenses: Not on file  Food Insecurity:   . Worried  About Charity fundraiser in the Last Year: Not on file  . Ran Out of Food in the Last Year: Not on file  Transportation Needs: No Transportation Needs  . Lack of Transportation (Medical): No  . Lack of Transportation (Non-Medical): No  Physical Activity:   . Days of Exercise per Week: Not on file  . Minutes of Exercise per Session: Not on file  Stress:   . Feeling of Stress : Not on file  Social Connections:   . Frequency of Communication with Friends and Family: Not on file  . Frequency of Social Gatherings with Friends and Family: Not on file  . Attends Religious Services: Not on file  . Active Member of Clubs or Organizations: Not on file  . Attends Archivist Meetings: Not on file  . Marital Status: Not on file  Intimate Partner Violence:   . Fear of Current or Ex-Partner: Not on file  . Emotionally Abused: Not on file  . Physically Abused: Not on file  . Sexually Abused: Not on file    Family History  Problem Relation Age of Onset  . Cancer Mother 61       unknown cancer, in her lower abdomen  . Cancer Sister 39       unknown cancer in her lower abdomen  . Heart disease Brother   . Cancer Niece        unknown form of cancer    Past Surgical History:  Procedure Laterality Date  . BREAST LUMPECTOMY WITH RADIOACTIVE SEED AND SENTINEL LYMPH NODE BIOPSY Right 08/31/2018   Procedure: RIGHT BREAST LUMPECTOMY WITH RADIOACTIVE SEED AND SENTINEL LYMPH NODE BIOPSY;  Surgeon: Stark Klein, MD;  Location: [facility];  Service: General;  Laterality: Right;  . NO PAST SURGERIES    . TUBAL LIGATION      ROS: Review of Systems Negative except as stated above  PHYSICAL EXAM: There were no vitals taken for this visit.  Physical Exam General appearance - alert, well appearing, and in no distress and oriented to person, place, and time Mental status - alert, oriented to person, place, and time, normal mood, behavior, speech, dress, motor activity, and  thought processes Chest - clear to auscultation, no wheezes, rales or rhonchi, symmetric air entry, no tachypnea, retractions or cyanosis Heart - normal rate, regular rhythm, normal S1, S2, no murmurs, rubs, clicks or gallops, normal rate and regular rhythm, S1 and S2 normal, no murmurs noted, no gallops noted.  CMP Latest Ref Rng & Units 12/13/2018 09/18/2018 08/29/2018  Glucose 70 - 99 mg/dL 91 92 95  BUN 8 - 23 mg/dL 15 14 12   Creatinine 0.44 - 1.00 mg/dL 0.75 0.81 0.65  Sodium 135 - 145 mmol/L 141 142 140  Potassium 3.5 - 5.1 mmol/L 4.0 4.4 4.3  Chloride 98 - 111 mmol/L 108 107 106  CO2 22 - 32 mmol/L 26 27  24  Calcium 8.9 - 10.3 mg/dL 9.1 9.0 9.4  Total Protein 6.5 - 8.1 g/dL 7.0 7.3 -  Total Bilirubin 0.3 - 1.2 mg/dL 0.4 0.4 -  Alkaline Phos 38 - 126 U/L 92 82 -  AST 15 - 41 U/L 15 14(L) -  ALT 0 - 44 U/L 10 11 -   Lipid Panel     Component Value Date/Time   CHOL 175 06/06/2018 1639   TRIG 85 06/06/2018 1639   HDL 56 06/06/2018 1639   CHOLHDL 3.1 06/06/2018 1639   CHOLHDL 3.2 10/29/2015 1204   VLDL 29 10/29/2015 1204   LDLCALC 102 (H) 06/06/2018 1639    CBC    Component Value Date/Time   WBC 3.1 (L) 12/13/2018 1028   RBC 4.45 12/13/2018 1028   HGB 11.8 (L) 12/13/2018 1028   HGB 11.9 (L) 07/12/2018 0848   HCT 37.6 12/13/2018 1028   PLT 180 12/13/2018 1028   PLT 231 07/12/2018 0848   MCV 84.5 12/13/2018 1028   MCH 26.5 12/13/2018 1028   MCHC 31.4 12/13/2018 1028   RDW 13.1 12/13/2018 1028   LYMPHSABS 0.6 (L) 12/13/2018 1028   MONOABS 0.3 12/13/2018 1028   EOSABS 0.1 12/13/2018 1028   BASOSABS 0.0 12/13/2018 1028    ASSESSMENT AND PLAN: 1. POSTMENOPAUSAL BLEEDING:  -Referral to gynecology for further evaluation and advisement -Referral to Dr. Azucena Fallen per patient and family request -Given the familial context of patient's mother's history of reproductive cancer and the patient's past history of breast cancer advisement from a gynecologist is  recommended -Patient and daughter educated that uterine bleeding in postmenopausal women is usually light and self-limited however, malignancy should be ruled out -Patient was given clear instructions to go to Emergency Department or return to medical center if symptoms don't improve, worsen, or new problems develop.The patient verbalized understanding. -Patient was given the opportunity to ask questions.  Patient verbalized understanding of the plan and was able to repeat key elements of the plan.   Requested Prescriptions    No prescriptions requested or ordered in this encounter    Amalee Olsen Zachery Dauer, NP

## 2019-04-09 NOTE — Patient Instructions (Signed)
Referral to Gynecology. Follow-up with primary NP as needed. Postmenopausal Bleeding  Postmenopausal bleeding is any bleeding that occurs after menopause. Menopause is when a woman's period stops. Any type of bleeding after menopause should be checked by your doctor. Treatment will depend on the cause. Follow these instructions at home:  Pay attention to any changes in your symptoms.  Avoid using tampons and douches as told by your doctor.  Change your pads regularly.  Get regular pelvic exams and Pap tests.  Take iron pills as told by your doctor.  Take over-the-counter and prescription medicines only as told by your doctor.  Keep all follow-up visits as told by your doctor. This is important. Contact a doctor if:  Your bleeding lasts for more than 1 week.  You have pain in your belly (abdomen).  You have bleeding during or after sex.  You have bleeding that happens more often than every 3 weeks. Get help right away if:  You have fever, chills, headache, dizziness, muscle aches, or bleeding.  You have very bad pain with bleeding.  You have clumps of blood (blood clots) coming from your vagina.  You have a lot of bleeding, and: ? You use more than 1 pad an hour. ? This kind of bleeding has never happened before.  You feel like you are going to pass out (faint). Summary  Any type of bleeding after menopause should be checked by your doctor.  Pay attention to any changes in your symptoms.  Keep all follow-up visits as told by your doctor. This information is not intended to replace advice given to you by your health care provider. Make sure you discuss any questions you have with your health care provider. Document Revised: 05/25/2018 Document Reviewed: 04/13/2016 Elsevier Patient Education  2020 [employer].

## 2019-04-25 ENCOUNTER — Encounter: Payer: Self-pay | Admitting: Genetic Counselor

## 2019-05-03 ENCOUNTER — Ambulatory Visit: Payer: Medicaid Other | Admitting: Licensed Clinical Social Worker

## 2019-05-03 ENCOUNTER — Telehealth: Payer: Self-pay | Admitting: Licensed Clinical Social Worker

## 2019-05-03 NOTE — Telephone Encounter (Signed)
Call placed to patient utilizing [employer] [phone]) regarding scheduled [facility] appointment. Pt disclosed that she would like call LCSW at a later time. Clinic number was provided and pt disconnected phone call.

## 2019-05-14 ENCOUNTER — Other Ambulatory Visit: Payer: Self-pay | Admitting: Nurse Practitioner

## 2019-05-14 ENCOUNTER — Other Ambulatory Visit: Payer: Self-pay | Admitting: Family Medicine

## 2019-05-14 DIAGNOSIS — I10 Essential (primary) hypertension: Secondary | ICD-10-CM

## 2019-05-14 DIAGNOSIS — E785 Hyperlipidemia, unspecified: Secondary | ICD-10-CM

## 2019-06-29 ENCOUNTER — Telehealth: Payer: Self-pay

## 2019-06-29 ENCOUNTER — Other Ambulatory Visit: Payer: Self-pay | Admitting: Nurse Practitioner

## 2019-06-29 DIAGNOSIS — R7303 Prediabetes: Secondary | ICD-10-CM

## 2019-06-29 NOTE — Telephone Encounter (Signed)
Medicaid recertification form faxed to [facility] @ [place].

## 2019-07-02 NOTE — Progress Notes (Signed)
[facility]  [phone] [phone]    ID: Emily Hendricks DOB: Sep 15, 1955  MR#: [id number]  UXY#:[id number]  Patient Care Team: Gildardo Pounds, NP as PCP - General (Nurse Practitioner) Mauro Kaufmann, RN as Oncology Nurse Navigator Rockwell Germany, RN as Oncology Nurse Navigator Stark Klein, MD as Consulting Physician (General Surgery) , Virgie Dad, MD as Consulting Physician (Oncology) Gery Pray, MD as Consulting Physician (Radiation Oncology) OTHER MD:   CHIEF COMPLAINT: Estrogen receptor positive breast cancer  CURRENT TREATMENT: Anastrozole   INTERVAL HISTORY: Emily Hendricks returns today for follow-up of her strogen receptor positive breast cancer accompanied by an interpreter, Emily Hendricks.  The patient's daughter also participated by speaker phone  The patient continues on anastrozole. She is having no side effects from it that she is aware of, specifically hot flashes are not a major issue.  There is no bone density screening on file.  This has been ordered to be done with mammography which is also due this month  Since her last visit here, she has not undergone any additional studies.    REVIEW OF SYSTEMS: Emily Hendricks has not received her vaccine nor has anyone in her family.  She walks about an hour almost every day, usually with a friend.  There have not been any unusual headaches visual changes cough phlegm production pleurisy shortness of breath or change in bowel or bladder habits.  Detailed review of systems was otherwise stable.  HISTORY OF CURRENT ILLNESS: From the original intake note:  Emily Hendricks had a right breast lump saometime in March 2020; she went to [facility] to be evaluated.  She underwent bilateral diagnostic mammography with tomography and right breast ultrasonography at [facility] on 06/29/2018 showing: Breast Density Category C. No dominant masses, suspicious calcifications  or secondary signs of malignancy are identified within the LEFT breast.   There is a spiculated mass within the upper slightly inner RIGHT breast, at far posterior depth, measuring approximately 2.2 cm greatest dimension, corresponding to the area of clinical concern. No dominant masses, suspicious calcifications or secondary signs of malignancy are identified elsewhere within the RIGHT breast. . Targeted ultrasound is performed, showing an irregular hypoechoic mass in the RIGHT breast at the 12:30 o'clock axis, 8 cm from the nipple, measuring 2.7 x 1.7 x 1.9 cm. The mass abuts and may involve the pectoralis muscle. RIGHT axilla was evaluated with ultrasound showing no enlarged or morphologically abnormal lymph nodes.  Accordingly on 07/03/2018 she proceeded to biopsy of the right breast area in question. 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 Prognostic indicators significant for: estrogen receptor, 95% positive with strong staining intensity and progesterone receptor, 10% positive with weak staining intensity. Proliferation marker Ki67 at 5%. HER2 equivocal (2+) by immunohistochemistry but reportedly negative by fluorescent in situ hybridization.  The patient's subsequent history is as detailed below.   PAST MEDICAL HISTORY: Past Medical History:  Diagnosis Date  . Diabetes mellitus without complication ([place])   . Family history of cancer   . Hyperlipidemia   . Hypertension     PAST SURGICAL HISTORY: Past Surgical History:  Procedure Laterality Date  . BREAST LUMPECTOMY WITH RADIOACTIVE SEED AND SENTINEL LYMPH NODE BIOPSY Right 08/31/2018   Procedure: RIGHT BREAST LUMPECTOMY WITH RADIOACTIVE SEED AND SENTINEL LYMPH NODE BIOPSY;  Surgeon: Stark Klein, MD;  Location: [facility];  Service: General;  Laterality: Right;  . NO PAST SURGERIES    .  TUBAL LIGATION    Tubal ligation in 1990 or  1991.    FAMILY HISTORY: Family History  Problem Relation Age of Onset  . Cancer Mother 4       unknown cancer, in her lower abdomen  . Cancer Sister 13       unknown cancer in her lower abdomen  . Heart disease Brother   . Cancer Niece        unknown form of cancer   Tayonna's father died from heart complications following a heart surgery about 45 years ago. Patients' mother died from unknown cancer in her 6's. The patient has 6 brothers and 1 sisters. Her sister had cancer of unknown type, but that was in her lower abdomen. Patient denies anyone else in her family having cancer.     GYNECOLOGIC HISTORY:  No LMP recorded. Patient is postmenopausal. 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 She moved here from [place], [place] in 2005. Her husband is partially disabled. At home with them is her mother-in-law, her son, and her daughter-in-law. Emily Hendricks's son's name is Emily Hendricks.  He is a Freight forwarder at [employer].  His wife is a Marine scientist.  The patient's daughter, Emily Hendricks, lives in [place] with her husband  ADVANCED DIRECTIVES: In the absence of any documentation, Izela's spouse, is her healthcare power of attorney. The appropriate documentation is provided on 07/12/2018 for her to review, notarize, and return and her earliest convenience.  The patient intends to name her daughter Emily Hendricks as her healthcare power of attorney.  She also has the daughter's phone number as her primary contact   HEALTH MAINTENANCE: Social History   Tobacco Use  . Smoking status: Never Smoker  . Smokeless tobacco: Never Used  Substance Use Topics  . Alcohol use: No  . Drug use: No    Colonoscopy:   PAP:   Bone density:   Allergies  Allergen Reactions  . Lisinopril Cough     Current Outpatient Medications  Medication Sig Dispense Refill  . acetaminophen (TYLENOL) 500 MG tablet Take by mouth every 6 (six) hours as needed. Pt does not know dosage    . anastrozole (ARIMIDEX) 1 MG tablet Take 1 tablet (1 mg total) by mouth daily. 90 tablet 4  . Cholecalciferol (VITAMIN D) 2000 units tablet Take 1 tablet (2,000 Units total) by mouth daily. 30 tablet 5  . ibuprofen (ADVIL,MOTRIN) 600 MG tablet Take 1 tablet (600 mg total) by mouth every 8 (eight) hours as needed for headache. Take with food. 40 tablet 0  . losartan (COZAAR) 50 MG tablet TAKE ONE TABLET BY MOUTH DAILY 90 tablet 0  . metFORMIN (GLUCOPHAGE) 500 MG tablet TAKE ONE TABLET BY MOUTH TWICE A DAY WITH MEALS 60 tablet 2  . simvastatin (ZOCOR) 20 MG tablet TAKE ONE TABLET BY MOUTH DAILY 30 tablet 1  . triamcinolone cream (KENALOG) 0.5 % Apply 1 application topically 2 (two) times daily. 60 g 0   No current facility-administered medications for this visit.    OBJECTIVE:  Emily Hendricks woman in no acute distress  There were no vitals filed for this visit. Wt Readings from Last 3 Encounters:  04/09/19 134 lb 12.8 oz (61.1 kg)  12/25/18 132 lb 4 oz (60 kg)  12/13/18 131  lb 14.4 oz (59.8 kg)   There is no height or weight on file to calculate BMI.    ECOG FS:1 - Symptomatic but completely ambulatory  Sclerae unicteric, EOMs intact Wearing a mask No cervical or supraclavicular adenopathy Lungs no rales or rhonchi Heart regular rate and rhythm Abd soft, nontender, positive bowel sounds MSK no focal spinal tenderness, no upper extremity lymphedema Neuro: nonfocal, well oriented, appropriate affect Breasts: The right breast is status post lumpectomy and radiation.  It is slightly hyperpigmented and definitely firmer than the left breast but there is no evidence of local recurrence.  The left breast is benign.  Both axillae are benign.   LAB RESULTS:  CMP     Component Value Date/Time   NA 143 07/03/2019  0920   NA 142 06/06/2018 1639   K 4.0 07/03/2019 0920   CL 108 07/03/2019 0920   CO2 26 07/03/2019 0920   GLUCOSE 94 07/03/2019 0920   BUN 14 07/03/2019 0920   BUN 15 06/06/2018 1639   CREATININE 0.75 07/03/2019 0920   CREATININE 0.83 07/12/2018 0848   CREATININE 0.78 02/03/2016 1613   CALCIUM 9.1 07/03/2019 0920   PROT 7.1 07/03/2019 0920   PROT 6.5 06/06/2018 1639   ALBUMIN 3.9 07/03/2019 0920   ALBUMIN 4.3 06/06/2018 1639   AST 17 07/03/2019 0920   AST 17 07/12/2018 0848   ALT 13 07/03/2019 0920   ALT 12 07/12/2018 0848   ALKPHOS 92 07/03/2019 0920   BILITOT 0.5 07/03/2019 0920   BILITOT 0.5 07/12/2018 0848   GFRNONAA >60 07/03/2019 0920   GFRNONAA >60 07/12/2018 0848   GFRNONAA 82 02/03/2016 1613   GFRAA >60 07/03/2019 0920   GFRAA >60 07/12/2018 0848   GFRAA >89 02/03/2016 1613    No results found for: TOTALPROTELP, ALBUMINELP, A1GS, A2GS, BETS, BETA2SER, GAMS, MSPIKE, SPEI  No results found for: KPAFRELGTCHN, LAMBDASER, KAPLAMBRATIO  Lab Results  Component Value Date   WBC 3.4 (L) 07/03/2019   NEUTROABS 2.2 07/03/2019   HGB 11.6 (L) 07/03/2019   HCT 37.7 07/03/2019   MCV 85.1 07/03/2019   PLT 213 07/03/2019    No results found for: LABCA2  No components found for: [id number]  No results for input(s): INR in the last 168 hours.  No results found for: LABCA2  No results found for: FIE332  No results found for: RJJ884  No results found for: ZYS063  No results found for: CA2729  No components found for: HGQUANT  No results found for: CEA1 / No results found for: CEA1   No results found for: AFPTUMOR  No results found for: CHROMOGRNA  No results found for: HGBA, HGBA2QUANT, HGBFQUANT, HGBSQUAN (Hemoglobinopathy evaluation)   No results found for: LDH  No results found for: IRON, TIBC, IRONPCTSAT (Iron and TIBC)  No results found for: FERRITIN  Urinalysis No results found for: COLORURINE, APPEARANCEUR, LABSPEC, PHURINE, GLUCOSEU, HGBUR,  BILIRUBINUR, KETONESUR, PROTEINUR, UROBILINOGEN, NITRITE, LEUKOCYTESUR   STUDIES:  No results found.   ELIGIBLE FOR AVAILABLE RESEARCH PROTOCOL: no   ASSESSMENT: 64 y.o. [place] woman status post right breast biopsy 07/03/2018 for a clinical T2 N0, stage IB invasive ductal carcinoma, grade 2, estrogen receptor strongly positive, progesterone receptor weakly positive, with no HER-2 amplification and an MIB-1 of 5%.  (1) neoadjuvant anastrozole started 07/12/2018, interrupted during radiation, resumed 11/28/2018  (a) bone density  (2) status post right lumpectomy and sentinel lymph node sampling 08/31/2018 for a pT2 pN0, stage IB invasive ductal carcinoma,  grade 2, with close but negative margins  (a) a total of 3 sentinel lymph nodes removed  (3) MammaPrint shows a luminal type low risk tumor, predicting a 97.8% chance of being alive without distant recurrence at 5 years with antiestrogen therapy alone [without chemotherapy]  (4) adjuvant radiation 10/05/2018 - 11/22/2018 Site Technique Total Dose Dose per Fx Completed Fx Beam Energies  Breast: Breast_Rt 3D 50.4/50.4 1.8 28/28 6X  Breast: Breast_Rt_axilla 3D 45/45 1.8 25/25 6X, 10X  Breast: Breast_Rt_Bst 3D 12/12 2 6/6 6X, 10X    (5) Genetic testing on 07/12/2018 through the Common Hereditary Gene Panel offered by [facility] found no pathogenic mutations in APC, ATM, AXIN2, BARD1, BMPR1A, BRCA1, BRCA2, BRIP1, CDH1, CDK4, CDKN2A (p14ARF), CDKN2A (p16INK4a), CHEK2, CTNNA1, DICER1, EPCAM (Deletion/duplication testing only), GREM1 (promoter region deletion/duplication testing only), KIT, MEN1, MLH1, MSH2, MSH3, MSH6, MUTYH, NBN, NF1, NHTL1, PALB2, PDGFRA, PMS2, POLD1, POLE, PTEN, RAD50, RAD51C, RAD51D, RNF43, SDHB, SDHC, SDHD, SMAD4, SMARCA4. STK11, TP53, TSC1, TSC2, and VHL.  The following genes were evaluated for sequence changes only: SDHA and HOXB13 c.251G>A variant only.   PLAN:  Emily Hendricks is now just about a year out  from definitive surgery for her breast cancer with no evidence of disease recurrence.  This is very favorable.  She continues on anastrozole with good tolerance and the plan is to continue that a minimum of 5 years.  She is due for mammography and a bone density.  Those orders are in.  I alerted her daughter to watch out for that phone call so they can get it done this month.  I also recommended they received either the [place] or the majority of vaccine.  This needs to be done after the mammogram so there is no confusion regarding adenopathy  She will see [place] again in 6 months and then again in a year; at that point we will start seeing her on a yearly basis  Total encounter time 30 minutes.*  , Virgie Dad, MD  07/03/19 10:02 AM Medical Oncology and Hematology [facility] [place],  [address] Tel. [phone]    Fax. [phone]   I, Wilburn Mylar, am acting as scribe for Dr. Virgie Dad. .  I, Lurline Del MD, have reviewed the above documentation for accuracy and completeness, and I agree with the above.    *Total Encounter Time as defined by the Centers for Medicare and Medicaid Services includes, in addition to the face-to-face time of a patient visit (documented in the note above) non-face-to-face time: obtaining and reviewing outside history, ordering and reviewing medications, tests or procedures, care coordination (Hendricks with other health care professionals or caregivers) and documentation in the medical record.

## 2019-07-03 ENCOUNTER — Other Ambulatory Visit: Payer: Self-pay

## 2019-07-03 ENCOUNTER — Inpatient Hospital Stay (HOSPITAL_BASED_OUTPATIENT_CLINIC_OR_DEPARTMENT_OTHER): Payer: Medicaid Other | Admitting: Oncology

## 2019-07-03 ENCOUNTER — Inpatient Hospital Stay: Payer: Medicaid Other | Attending: Oncology

## 2019-07-03 VITALS — BP 159/86 | HR 82 | Temp 98.7°F | Resp 18 | Ht 61.0 in | Wt 134.4 lb

## 2019-07-03 DIAGNOSIS — Z79899 Other long term (current) drug therapy: Secondary | ICD-10-CM | POA: Diagnosis not present

## 2019-07-03 DIAGNOSIS — C50211 Malignant neoplasm of upper-inner quadrant of right female breast: Secondary | ICD-10-CM

## 2019-07-03 DIAGNOSIS — Z1379 Encounter for other screening for genetic and chromosomal anomalies: Secondary | ICD-10-CM

## 2019-07-03 DIAGNOSIS — Z79811 Long term (current) use of aromatase inhibitors: Secondary | ICD-10-CM | POA: Diagnosis not present

## 2019-07-03 DIAGNOSIS — Z170 Estrogen receptor positive status [ER+]: Secondary | ICD-10-CM

## 2019-07-03 DIAGNOSIS — E119 Type 2 diabetes mellitus without complications: Secondary | ICD-10-CM | POA: Diagnosis not present

## 2019-07-03 DIAGNOSIS — C50911 Malignant neoplasm of unspecified site of right female breast: Secondary | ICD-10-CM | POA: Insufficient documentation

## 2019-07-03 DIAGNOSIS — I10 Essential (primary) hypertension: Secondary | ICD-10-CM | POA: Diagnosis not present

## 2019-07-03 DIAGNOSIS — Z923 Personal history of irradiation: Secondary | ICD-10-CM | POA: Insufficient documentation

## 2019-07-03 DIAGNOSIS — Z809 Family history of malignant neoplasm, unspecified: Secondary | ICD-10-CM | POA: Diagnosis not present

## 2019-07-03 DIAGNOSIS — E785 Hyperlipidemia, unspecified: Secondary | ICD-10-CM | POA: Diagnosis not present

## 2019-07-03 LAB — CBC WITH DIFFERENTIAL/PLATELET
Abs Immature Granulocytes: 0.01 10*3/uL (ref 0.00–0.07)
Basophils Absolute: 0 10*3/uL (ref 0.0–0.1)
Basophils Relative: 0 %
Eosinophils Absolute: 0.1 10*3/uL (ref 0.0–0.5)
Eosinophils Relative: 2 %
HCT: 37.7 % (ref 36.0–46.0)
Hemoglobin: 11.6 g/dL — ABNORMAL LOW (ref 12.0–15.0)
Immature Granulocytes: 0 %
Lymphocytes Relative: 25 %
Lymphs Abs: 0.9 10*3/uL (ref 0.7–4.0)
MCH: 26.2 pg (ref 26.0–34.0)
MCHC: 30.8 g/dL (ref 30.0–36.0)
MCV: 85.1 fL (ref 80.0–100.0)
Monocytes Absolute: 0.3 10*3/uL (ref 0.1–1.0)
Monocytes Relative: 8 %
Neutro Abs: 2.2 10*3/uL (ref 1.7–7.7)
Neutrophils Relative %: 65 %
Platelets: 213 10*3/uL (ref 150–400)
RBC: 4.43 MIL/uL (ref 3.87–5.11)
RDW: 13.4 % (ref 11.5–15.5)
WBC: 3.4 10*3/uL — ABNORMAL LOW (ref 4.0–10.5)
nRBC: 0 % (ref 0.0–0.2)

## 2019-07-03 LAB — COMPREHENSIVE METABOLIC PANEL
ALT: 13 U/L (ref 0–44)
AST: 17 U/L (ref 15–41)
Albumin: 3.9 g/dL (ref 3.5–5.0)
Alkaline Phosphatase: 92 U/L (ref 38–126)
Anion gap: 9 (ref 5–15)
BUN: 14 mg/dL (ref 8–23)
CO2: 26 mmol/L (ref 22–32)
Calcium: 9.1 mg/dL (ref 8.9–10.3)
Chloride: 108 mmol/L (ref 98–111)
Creatinine, Ser: 0.75 mg/dL (ref 0.44–1.00)
GFR calc Af Amer: >60 mL/min (ref >60)
GFR calc non Af Amer: >60 mL/min (ref >60)
Glucose, Bld: 94 mg/dL (ref 70–99)
Potassium: 4 mmol/L (ref 3.5–5.1)
Sodium: 143 mmol/L (ref 135–145)
Total Bilirubin: 0.5 mg/dL (ref 0.3–1.2)
Total Protein: 7.1 g/dL (ref 6.5–8.1)

## 2019-07-03 MED ORDER — ANASTROZOLE 1 MG PO TABS: 1.0000 mg | ORAL_TABLET | Freq: Every day | ORAL | 4 refills | Status: DC

## 2019-07-04 ENCOUNTER — Telehealth: Payer: Self-pay | Admitting: Oncology

## 2019-07-04 NOTE — Telephone Encounter (Signed)
Scheduled appts per 4/13 los. Left voicemail with new appt details.

## 2019-07-07 ENCOUNTER — Other Ambulatory Visit: Payer: Self-pay | Admitting: Oncology

## 2019-07-07 ENCOUNTER — Other Ambulatory Visit: Payer: Self-pay | Admitting: Family Medicine

## 2019-07-07 DIAGNOSIS — E785 Hyperlipidemia, unspecified: Secondary | ICD-10-CM

## 2019-07-24 ENCOUNTER — Telehealth: Payer: Self-pay | Admitting: Radiation Oncology

## 2019-07-24 NOTE — Telephone Encounter (Addendum)
Received voicemail message from patient's child, Sweta. Sweta explained her mother was seen recently but hasn't been scheduled for follow up scans like they were told she would be. Upon further investigation I noted the patient is one of Dr. Virgie Dad seen on 07/03/2019. Per Dr. Virgie Dad note a bone scan and mammogram were to be ordered and scheduled. Sweta reports neither has been done. Provided Sweta Dr. Virgie Dad nurses number. Committed to contacting Dr. Virgie Dad nurse and I did by leaving a detailed voicemail. In addition I will route this note to [employer].

## 2019-07-25 ENCOUNTER — Telehealth: Payer: Self-pay | Admitting: *Deleted

## 2019-07-25 ENCOUNTER — Other Ambulatory Visit: Payer: Self-pay | Admitting: *Deleted

## 2019-07-25 NOTE — Telephone Encounter (Signed)
See other phone note

## 2019-07-25 NOTE — Telephone Encounter (Signed)
This RN reviewed orders and noted placed in Sept with expected date to be performed mid April.  This RN called Emily Hendricks- and informed her best for her to call to [facility] to schedule.  Phone number given with Emily Hendricks verbalizing understanding.

## 2019-08-08 ENCOUNTER — Other Ambulatory Visit: Payer: Self-pay | Admitting: Family Medicine

## 2019-08-08 DIAGNOSIS — E785 Hyperlipidemia, unspecified: Secondary | ICD-10-CM

## 2019-08-21 ENCOUNTER — Other Ambulatory Visit: Payer: Self-pay | Admitting: Family Medicine

## 2019-08-21 DIAGNOSIS — E785 Hyperlipidemia, unspecified: Secondary | ICD-10-CM

## 2019-08-31 ENCOUNTER — Ambulatory Visit: Payer: Medicaid Other | Admitting: Nurse Practitioner

## 2019-08-31 ENCOUNTER — Other Ambulatory Visit: Payer: Self-pay | Admitting: Nurse Practitioner

## 2019-08-31 ENCOUNTER — Telehealth: Payer: Self-pay | Admitting: Nurse Practitioner

## 2019-08-31 DIAGNOSIS — E785 Hyperlipidemia, unspecified: Secondary | ICD-10-CM

## 2019-08-31 DIAGNOSIS — I10 Essential (primary) hypertension: Secondary | ICD-10-CM

## 2019-08-31 DIAGNOSIS — R7303 Prediabetes: Secondary | ICD-10-CM

## 2019-08-31 MED ORDER — LOSARTAN POTASSIUM 50 MG PO TABS: 50.0000 mg | ORAL_TABLET | Freq: Every day | ORAL | 0 refills | Status: DC

## 2019-08-31 MED ORDER — SIMVASTATIN 20 MG PO TABS: 20.0000 mg | ORAL_TABLET | Freq: Every day | ORAL | 0 refills | Status: DC

## 2019-08-31 MED ORDER — ANASTROZOLE 1 MG PO TABS: 1.0000 mg | ORAL_TABLET | Freq: Every day | ORAL | 0 refills | Status: DC

## 2019-08-31 MED ORDER — METFORMIN HCL 500 MG PO TABS: 500.0000 mg | ORAL_TABLET | Freq: Two times a day (BID) | ORAL | 0 refills | Status: DC

## 2019-08-31 NOTE — Telephone Encounter (Signed)
Rx sent. Pt must keep upcoming appointment.

## 2019-08-31 NOTE — Telephone Encounter (Signed)
Patient called in and requested for listed medication to be refilled and sent to [facility].  anastrozole (ARIMIDEX) 1 MG tablet [[id number]]  losartan (COZAAR) 50 MG tablet [[id number] metFORMIN (GLUCOPHAGE) 500 MG tablet [[id number]]  simvastatin (ZOCOR) 20 MG tablet [[id number]]

## 2019-09-18 NOTE — Progress Notes (Signed)
Patient ID: Emily Hendricks, female    DOB: 30-Sep-1955  MRN: [id number]  CC: Hypertension follow-up  Subjective: Emily Hendricks is a 64 y.o. 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  1. HYPERTENSION FOLLOW-UP: Currently taking: see medication list Have you taken your blood pressure medication today: [x]  Yes []  No  Med Adherence: [x]  Yes    []  No Medication side effects: []  Yes    [x]  No Adherence with salt restriction: [x]  Yes    []  No Exercise: Yes [x]  No []   Home Monitoring?: []  Yes    [x]  No Monitoring Frequency: []  Yes    [x]  No Home BP results range: []  Yes    [x]  No Smoking []  Yes [x]  No SOB? []  Yes    [x]  No Chest Pain?: []  Yes    [x]  No Leg swelling?: []  Yes    [x]  No Headaches?: [x]  Yes, sometimes, takes Tylenol which helps Dizziness? []  Yes    [x]  No Comments: Last visit 06/06/2018 with nurse practitioner Raul Del. During that encounter Losartan continued. CMP obtained.  2. PRE-DIABETES FOLLOW-UP: Last A1C: 6.2 on 09/20/2019 Are you fasting today: []  Yes [x]  No, drunk tea and ate eggplants with squash Have you taken your anti-diabetic medications today: [x]  Yes []  No  Med Adherence:  [x]  Yes    []  No Medication side effects:  []  Yes    [x]  No Home Monitoring?  []  Yes    [x]  No Home glucose results range: none Diet Adherence: [x]  Yes    []  No  Exercise: [x]  Yes    []  No Hypoglycemic episodes?: []  Yes    [x]  No Numbness of the feet? []  Yes    [x]  No Retinopathy hx? []  Yes    [x]  No middle finger numb of right hand sometimes which comes and goes but has improved a lot lately Last eye exam: 2020 and wearing corrective lenses Comments: Last visit 06/06/2018 with nurse practitioner Raul Del. During that encounter pre-diabetes controlled. Metformin continued. CBG, A1C, and lipid panel obtained.   3.  CHOLESTEROL FOLLOW-UP: Last Lipid Panel results:  HDL  Date Value Ref Range Status  06/06/2018 56 >39 mg/dL Final   Triglycerides  Date Value Ref Range Status  06/06/2018 85 0 - 149 mg/dL Final    Are you fasting today: []  Yes [x]  No Med Adherence: [x]  Yes    []  No Medication side effects: []  Yes    [x]  No Muscle aches:  []  Yes    [x]  No Diet Adherence: [x]  Yes    []  No Comments:  Last visit 11/28/2017 with nurse practitioner Raul Del. During that encounter Simvastatin continued.    Patient Active Problem List   Diagnosis Date Noted  . Genetic testing 07/24/2018  . Family history of cancer   . Malignant neoplasm of upper-inner quadrant of right breast in female, estrogen receptor positive ([place]) 07/06/2018     Current Outpatient Medications on File Prior to Visit  Medication Sig Dispense Refill  . acetaminophen (TYLENOL) 500 MG tablet Take by mouth every 6 (six) hours as needed. Pt does not know dosage    . anastrozole (ARIMIDEX) 1 MG tablet Take 1 tablet (1 mg total) by mouth daily. 30 tablet 0  . Cholecalciferol (VITAMIN D) 2000 units tablet Take 1 tablet (2,000 Units total) by mouth daily. 30 tablet 5  . ibuprofen (ADVIL,MOTRIN) 600 MG  tablet Take 1 tablet (600 mg total) by mouth every 8 (eight) hours as needed for headache. Take with food. 40 tablet 0  . losartan (COZAAR) 50 MG tablet Take 1 tablet (50 mg total) by mouth daily. 30 tablet 0  . metFORMIN (GLUCOPHAGE) 500 MG tablet Take 1 tablet (500 mg total) by mouth 2 (two) times daily with a meal. 60 tablet 0  . simvastatin (ZOCOR) 20 MG tablet Take 1 tablet (20 mg total) by mouth daily. Must have office visit for refills. 30 tablet 0  . triamcinolone cream (KENALOG) 0.5 % Apply 1 application topically 2 (two) times daily. 60 g 0   No current facility-administered medications on file prior to visit.    Allergies  Allergen Reactions  . Lisinopril Cough    Social History   Socioeconomic History  . Marital status: Married      Spouse name: Not on file  . Number of children: 2  . Years of education: Not on file  . Highest education level: 12th grade  Occupational History  . Not on file  Tobacco Use  . Smoking status: Never Smoker  . Smokeless tobacco: Never Used  Vaping Use  . Vaping Use: Never used  Substance and Sexual Activity  . Alcohol use: No  . Drug use: No  . Sexual activity: Never  Other Topics Concern  . Not on file  Social History Narrative  . Not on file   Social Determinants of Health   Financial Resource Strain:   . Difficulty of Paying Living Expenses:   Food Insecurity:   . Worried About Charity fundraiser in the Last Year:   . Arboriculturist in the Last Year:   Transportation Needs:   . Film/video editor (Medical):   Marland Kitchen Lack of Transportation (Non-Medical):   Physical Activity:   . Days of Exercise per Week:   . Minutes of Exercise per Session:   Stress:   . Feeling of Stress :   Social Connections:   . Frequency of Communication with Friends and Family:   . Frequency of Social Gatherings with Friends and Family:   . Attends Religious Services:   . Active Member of Clubs or Organizations:   . Attends Archivist Meetings:   Marland Kitchen Marital Status:   Intimate Partner Violence:   . Fear of Current or Ex-Partner:   . Emotionally Abused:   Marland Kitchen Physically Abused:   . Sexually Abused:     Family History  Problem Relation Age of Onset  . Cancer Mother 25       unknown cancer, in her lower abdomen  . Cancer Sister 31       unknown cancer in her lower abdomen  . Heart disease Brother   . Cancer Niece        unknown form of cancer    Past Surgical History:  Procedure Laterality Date  . BREAST LUMPECTOMY WITH RADIOACTIVE SEED AND SENTINEL LYMPH NODE BIOPSY Right 08/31/2018   Procedure: RIGHT BREAST LUMPECTOMY WITH RADIOACTIVE SEED AND SENTINEL LYMPH NODE BIOPSY;  Surgeon: Stark Klein, MD;  Location: [facility];  Service: General;  Laterality:  Right;  . NO PAST SURGERIES    . TUBAL LIGATION      ROS: Review of Systems Negative except as stated above  PHYSICAL EXAM: Vitals with BMI 09/20/2019 07/03/2019 04/09/2019  Height 5\' 2"  5\' 1"  -  Weight 136 lbs 3 oz 134 lbs 6 oz 134 lbs 13  oz  BMI 31.54 00.86 -  Systolic 761 950 932  Diastolic 84 86 79  Pulse 83 82 92  SpO2- 95%, room air  Temperature- 97.16F, oral  Physical Exam General appearance - alert, well appearing, and in no distress and oriented to person, place, and time Mental status - alert, oriented to person, place, and time, normal mood, behavior, speech, dress, motor activity, and thought processes Eyes - pupils equal and reactive, extraocular eye movements intact Ears - bilateral TM's and external ear canals normal Neck - supple, no significant adenopathy Lymphatics - no palpable lymphadenopathy, no hepatosplenomegaly Chest - clear to auscultation, no wheezes, rales or rhonchi, symmetric air entry, no tachypnea, retractions or cyanosis Heart - normal rate, regular rhythm, normal S1, S2, no murmurs, rubs, clicks or gallops Neurological - alert, oriented, normal speech, no focal findings or movement disorder noted, neck supple without rigidity, cranial nerves II through XII intact, funduscopic exam normal, discs flat and sharp, DTR's normal and symmetric, motor and sensory grossly normal bilaterally, normal muscle tone, no tremors, strength 5/5, Romberg sign negative, normal gait and station Musculoskeletal - no joint tenderness, deformity or swelling, no muscular tenderness noted, abnormal exam of right right hand middle and ring finger unable to bend without patient manually bending fingers Extremities - peripheral pulses normal, no pedal edema, no clubbing or cyanosis, peripheral pulses normal, no pedal edema, no clubbing or cyanosis, feet normal, good pulses, normal color, temperature and sensation, monofilament sensory exam is normal in both feet, hardened callus of  right sole of foot without drainage and tender to palpation (callus about the size of a green pea)  Results for orders placed or performed in visit on 09/20/19  Glucose (CBG)  Result Value Ref Range   POC Glucose 115 (A) 70 - 99 mg/dl  POCT glycosylated hemoglobin (Hb A1C)  Result Value Ref Range   Hemoglobin A1C     HbA1c POC (<> result, manual entry)     HbA1c, POC (prediabetic range) 6.2 5.7 - 6.4 %   HbA1c, POC (controlled diabetic range)      ASSESSMENT AND PLAN: 1. Essential hypertension: -Patient's blood pressure therapeutic during today's visit. Level of blood pressure control unknown as patient does not monitor blood pressure at home.  -Continue Losartan as prescribed for management of high blood pressure. -Counseled on blood pressure goal of less than 130/80, low-sodium, DASH diet, medication compliance, 150 minutes of moderate intensity exercise per week as tolerated. Discussed medication compliance, adverse effects. -CMP last obtained 07/03/2019. -CBC last obtained 07/03/2019. -Follow-up with primary provider in 3 months or sooner if needed for management of chronic condition. - losartan (COZAAR) 50 MG tablet; Take 1 tablet (50 mg total) by mouth daily.  Dispense: 30 tablet; Refill: 2  2. Pre-diabetes: -Patient not fasting during today's visit.  -Hemoglobin A1C at goal today being 6.2% with the goal being <7%.  -Patient is prediabetic. Continue Metformin as prescribed for management of prediabetes. -To achieve an A1C goal of less than or equal to 7.0 percent, a fasting blood sugar of 80 to 130 mg/dL and a postprandial glucose (90 to 120 minutes after a meal) less than 180 mg/dL. In the event of sugars less than 60 mg/dl or greater than 400 mg/dl please notify the clinic ASAP. It is recommended that you undergo annual eye exams and annual foot exams. -Discussed the importance of healthy eating habits, low-carbohydrate diet, low-sugar diet, regular aerobic exercise (at least 150  minutes a week as tolerated) and medication  compliance to achieve or maintain control of diabetes. -Follow-up with primary provider in 3 months or sooner if needed for management of chronic condition. -Referral to Podiatry. Patient with callus on right sole of foot. Callus is hardened, without drainage, and erythema. Callus is tender to palpation. Patient reports this has been there for some time. Reports she usually places tape there to prevent rubbing the callus when she walks which usually doesn't help. Patient has bilateral normal sensation of feet. - Glucose (CBG) - POCT glycosylated hemoglobin (Hb A1C) - metFORMIN (GLUCOPHAGE) 500 MG tablet; Take 1 tablet (500 mg total) by mouth 2 (two) times daily with a meal.  Dispense: 60 tablet; Refill: 2 - Ambulatory referral to Podiatry  3. Dyslipidemia:  -Continue Simvastain for cholesterol management.  -Practice low-fat heart healthy diet and at least 150 minutes of moderate intensity exercise weekly as tolerated.  -Patient is not fasting today. Will recheck cholesterol at next visit while patient fasting.  -Follow-up in 3 months or sooner if needed for management of chronic condition. - Lipid Panel; Future - simvastatin (ZOCOR) 20 MG tablet; Take 1 tablet (20 mg total) by mouth daily. Must have office visit for refills.  Dispense: 30 tablet; Refill: 2  4. Trigger middle finger of right hand:  -Patient with trigger middle finger of right hand. Patient reports she has never noticed this before today. Reports this is not painful. Denies pain, popping, clicking, and swelling. -Use activity moderation to prevent aggravating the finger. May use finger splint as needed.  -Counseled patient to monitor this and if it becomes worse to notify provider for further management.   5. Trigger ring finger of right hand: -Patient with trigger ring finger of right hand. Patient reports she has never noticed this before today. Denies pain, popping, clicking, and  swelling.  -Use activity moderation to prevent aggravating the finger. May use finger splint as needed.  -Counseled patient to monitor this and if it becomes worse to notify provider for further management.   6. Language barrier: -Patient and daughter refused interpretation services. Patient's daughter, Janvi Ammar, serving as interpreter during this encounter.   Patient was given the opportunity to ask questions.  Patient verbalized understanding of the plan and was able to repeat key elements of the plan. Patient was given clear instructions to go to Emergency Department or return to medical center if symptoms don't improve, worsen, or new problems develop.The patient verbalized understanding.   Camillia Herter, NP

## 2019-09-20 ENCOUNTER — Ambulatory Visit: Payer: Medicaid Other | Attending: Family | Admitting: Family

## 2019-09-20 ENCOUNTER — Encounter: Payer: Self-pay | Admitting: Family

## 2019-09-20 ENCOUNTER — Other Ambulatory Visit: Payer: Self-pay

## 2019-09-20 VITALS — BP 131/84 | HR 83 | Temp 97.5°F | Resp 16 | Ht 62.0 in | Wt 136.2 lb

## 2019-09-20 DIAGNOSIS — R7303 Prediabetes: Secondary | ICD-10-CM

## 2019-09-20 DIAGNOSIS — M65341 Trigger finger, right ring finger: Secondary | ICD-10-CM

## 2019-09-20 DIAGNOSIS — Z789 Other specified health status: Secondary | ICD-10-CM

## 2019-09-20 DIAGNOSIS — E785 Hyperlipidemia, unspecified: Secondary | ICD-10-CM

## 2019-09-20 DIAGNOSIS — M65331 Trigger finger, right middle finger: Secondary | ICD-10-CM | POA: Diagnosis not present

## 2019-09-20 DIAGNOSIS — I10 Essential (primary) hypertension: Secondary | ICD-10-CM | POA: Diagnosis not present

## 2019-09-20 LAB — POCT GLYCOSYLATED HEMOGLOBIN (HGB A1C): HbA1c, POC (prediabetic range): 6.2 % (ref 5.7–6.4)

## 2019-09-20 LAB — GLUCOSE, POCT (MANUAL RESULT ENTRY): POC Glucose: 115 mg/dl — AB (ref 70–99)

## 2019-09-20 MED ORDER — LOSARTAN POTASSIUM 50 MG PO TABS: 50.0000 mg | ORAL_TABLET | Freq: Every day | ORAL | 2 refills | Status: DC

## 2019-09-20 MED ORDER — SIMVASTATIN 20 MG PO TABS: 20.0000 mg | ORAL_TABLET | Freq: Every day | ORAL | 2 refills | Status: DC

## 2019-09-20 MED ORDER — METFORMIN HCL 500 MG PO TABS: 500.0000 mg | ORAL_TABLET | Freq: Two times a day (BID) | ORAL | 2 refills | Status: DC

## 2019-09-20 NOTE — Patient Instructions (Addendum)
??????? ?? ??? ???????? ???? ????? ?????? ?? ??? ?????????? ???? ????? ??????????? ?? ??? ????????????? ???? ????? ??? ?????? ?? ?? 3 ????? ?? ???? ???? ???????? ???????? ?? ??? ???????? ???????? ????? raktachaap ke lie losaartan jaaree rakhen. madhumeh ke lie metaphormin jaaree rakhen. kolestrol ke lie simaavaastetin jaaree [place] Golds ho to 3 maheene ya usase pahale praathamik chikitsak ke saath anuvartee kaarravaee karen.  Continue Losartan for blood pressure. Continue Metformin for diabetes. Continue Simavastatin for cholesterol. Follow-up with primary physician in 3 months or sooner if needed.   Hypertension, Adult Hypertension is another name for high blood pressure. High blood pressure forces your heart to work harder to pump blood. This can cause problems over time. There are two numbers in a blood pressure reading. There is a top number (systolic) over a bottom number (diastolic). It is best to have a blood pressure that is below 120/80. Healthy choices can help lower your blood pressure, or you may need medicine to help lower it. What are the causes? The cause of this condition is not known. Some conditions may be related to high blood pressure. What increases the risk?  Smoking.  Having type 2 diabetes mellitus, high cholesterol, or both.  Not getting enough exercise or physical activity.  Being overweight.  Having too much fat, sugar, calories, or salt (sodium) in your diet.  Drinking too much alcohol.  Having long-term (chronic) kidney disease.  Having a family history of high blood pressure.  Age. Risk increases with age.  Race. You may be at higher risk if you are African American.  Gender. Men are at higher risk than women before age 10. After age 84, women are at higher risk than men.  Having obstructive sleep apnea.  Stress. What are the signs or symptoms?  High blood pressure may not cause symptoms. Very high blood pressure (hypertensive crisis)  may cause: ? Headache. ? Feelings of worry or nervousness (anxiety). ? Shortness of breath. ? Nosebleed. ? A feeling of being sick to your stomach (nausea). ? Throwing up (vomiting). ? Changes in how you see. ? Very bad chest pain. ? Seizures. How is this treated?  This condition is treated by making healthy lifestyle changes, such as: ? Eating healthy foods. ? Exercising more. ? Drinking less alcohol.  Your health care provider may prescribe medicine if lifestyle changes are not enough to get your blood pressure under control, and if: ? Your top number is above 130. ? Your bottom number is above 80.  Your personal target blood pressure may vary. Follow these instructions at home: Eating and drinking   If told, follow the DASH eating plan. To follow this plan: ? Fill one half of your plate at each meal with fruits and vegetables. ? Fill one fourth of your plate at each meal with whole grains. Whole grains include whole-wheat pasta, brown rice, and whole-grain bread. ? Eat or drink low-fat dairy products, such as skim milk or low-fat yogurt. ? Fill one fourth of your plate at each meal with low-fat (lean) proteins. Low-fat proteins include fish, chicken without skin, eggs, beans, and tofu. ? Avoid fatty meat, cured and processed meat, or chicken with skin. ? Avoid pre-made or processed food.  Eat less than 1,500 mg of salt each day.  Do not drink alcohol if: ? Your doctor tells you not to drink. ? You are pregnant, may be pregnant, or are planning to become pregnant.  If you drink alcohol: ? Limit how much you  use to:  0-1 drink a day for women.  0-2 drinks a day for men. ? Be aware of how much alcohol is in your drink. In the [place] one drink equals one 12 oz bottle of beer (355 mL), one 5 oz glass of wine (148 mL), or one 1 oz glass of hard liquor (44 mL). Lifestyle   Work with your doctor to stay at a healthy weight or to lose weight. Ask your doctor what the best  weight is for you.  Get at least 30 minutes of exercise most days of the week. This may include walking, swimming, or biking.  Get at least 30 minutes of exercise that strengthens your muscles (resistance exercise) at least 3 days a week. This may include lifting weights or doing Pilates.  Do not use any products that contain nicotine or tobacco, such as cigarettes, e-cigarettes, and chewing tobacco. If you need help quitting, ask your doctor.  Check your blood pressure at home as told by your doctor.  Keep all follow-up visits as told by your doctor. This is important. Medicines  Take over-the-counter and prescription medicines only as told by your doctor. Follow directions carefully.  Do not skip doses of blood pressure medicine. The medicine does not work as well if you skip doses. Skipping doses also puts you at risk for problems.  Ask your doctor about side effects or reactions to medicines that you should watch for. Contact a doctor if you:  Think you are having a reaction to the medicine you are taking.  Have headaches that keep coming back (recurring).  Feel dizzy.  Have swelling in your ankles.  Have trouble with your vision. Get help right away if you:  Get a very bad headache.  Start to feel mixed up (confused).  Feel weak or numb.  Feel faint.  Have very bad pain in your: ? Chest. ? Belly (abdomen).  Throw up more than once.  Have trouble breathing. Summary  Hypertension is another name for high blood pressure.  High blood pressure forces your heart to work harder to pump blood.  For most people, a normal blood pressure is less than 120/80.  Making healthy choices can help lower blood pressure. If your blood pressure does not get lower with healthy choices, you may need to take medicine. This information is not intended to replace advice given to you by your health care provider. Make sure you discuss any questions you have with your health care  provider. Document Revised: 11/16/2017 Document Reviewed: 11/16/2017 Elsevier Patient Education  2020 [employer].

## 2019-10-04 ENCOUNTER — Other Ambulatory Visit: Payer: Self-pay | Admitting: Oncology

## 2019-10-04 ENCOUNTER — Other Ambulatory Visit: Payer: Medicaid Other

## 2019-10-04 DIAGNOSIS — Z170 Estrogen receptor positive status [ER+]: Secondary | ICD-10-CM

## 2019-10-04 DIAGNOSIS — Z1379 Encounter for other screening for genetic and chromosomal anomalies: Secondary | ICD-10-CM

## 2019-10-16 ENCOUNTER — Ambulatory Visit: Payer: Medicaid Other | Admitting: Sports Medicine

## 2019-11-08 ENCOUNTER — Ambulatory Visit (INDEPENDENT_AMBULATORY_CARE_PROVIDER_SITE_OTHER): Payer: Medicaid Other | Admitting: Sports Medicine

## 2019-11-08 ENCOUNTER — Encounter: Payer: Self-pay | Admitting: Sports Medicine

## 2019-11-08 ENCOUNTER — Other Ambulatory Visit: Payer: Self-pay

## 2019-11-08 DIAGNOSIS — Q828 Other specified congenital malformations of skin: Secondary | ICD-10-CM

## 2019-11-08 DIAGNOSIS — M79671 Pain in right foot: Secondary | ICD-10-CM

## 2019-11-08 NOTE — Progress Notes (Signed)
Subjective: Emily Hendricks is a 63 y.o. female patient who presents to office for follow up evaluation of Right foot pain secondary to callus skin. Patient complains of pain at the lesion present Right plantar foot reports that it has been hurting more because she has to stand and do a lot of cooking for everyone in her household. Patient denies any other pedal complaints.   Patient is assisted by interpter who helps to report this history.     Patient Active Problem List   Diagnosis Date Noted  . Genetic testing 07/24/2018  . Family history of cancer   . Malignant neoplasm of upper-inner quadrant of right breast in female, estrogen receptor positive ([place]) 07/06/2018    Current Outpatient Medications on File Prior to Visit  Medication Sig Dispense Refill  . acetaminophen (TYLENOL) 500 MG tablet Take by mouth every 6 (six) hours as needed. Pt does not know dosage    . anastrozole (ARIMIDEX) 1 MG tablet Take 1 tablet (1 mg total) by mouth daily. 30 tablet 0  . Cholecalciferol (VITAMIN D) 2000 units tablet Take 1 tablet (2,000 Units total) by mouth daily. 30 tablet 5  . ibuprofen (ADVIL,MOTRIN) 600 MG tablet Take 1 tablet (600 mg total) by mouth every 8 (eight) hours as needed for headache. Take with food. 40 tablet 0  . losartan (COZAAR) 50 MG tablet Take 1 tablet (50 mg total) by mouth daily. 30 tablet 2  . metFORMIN (GLUCOPHAGE) 500 MG tablet Take 1 tablet (500 mg total) by mouth 2 (two) times daily with a meal. 60 tablet 2  . simvastatin (ZOCOR) 20 MG tablet Take 1 tablet (20 mg total) by mouth daily. Must have office visit for refills. 30 tablet 2  . triamcinolone cream (KENALOG) 0.5 % Apply 1 application topically 2 (two) times daily. 60 g 0   No current facility-administered medications on file prior to visit.    Allergies  Allergen Reactions  . Lisinopril Cough    Objective:  General: Alert and oriented x3 in no acute distress  Dermatology: Keratotic lesion present plantar  lateral and sub met 1 on right foot with skin lines transversing the lesions, pain is present with direct pressure to the lesions with a central nucleated core noted, no webspace macerations, no ecchymosis bilateral, all nails x 10 are well manicured.  Vascular: Dorsalis Pedis and Posterior Tibial pedal pulses 2/4, Capillary Fill Time 3 seconds, + pedal hair growth bilateral, no edema bilateral lower extremities, Temperature gradient within normal limits.  Neurology: Johney Maine sensation intact via light touch bilateral.  Musculoskeletal: Mild tenderness with palpation at the keratotic lesion sites on Right, Muscular strength 5/5 in all groups without pain or limitation on range of motion. No lower extremity muscular or boney deformity noted.  Assessment and Plan: Problem List Items Addressed This Visit    None    Visit Diagnoses    Porokeratosis    -  Primary   Right foot pain         -Complete examination performed -Discussed treatment options; Parred keratoic lesions x 2 using a chisel blade; treated the area withSalinocaine covered with bandaid -Gave sample of foot miracle cream and ecouraged use of pumice stone -Advised good supportive shoes daily  -Patient to return to office as needed or sooner if condition worsens.  Landis Martins, DPM

## 2019-12-06 ENCOUNTER — Other Ambulatory Visit: Payer: Self-pay | Admitting: Family

## 2019-12-06 DIAGNOSIS — I10 Essential (primary) hypertension: Secondary | ICD-10-CM

## 2019-12-06 DIAGNOSIS — E785 Hyperlipidemia, unspecified: Secondary | ICD-10-CM

## 2019-12-21 ENCOUNTER — Telehealth: Payer: Medicaid Other | Admitting: Nurse Practitioner

## 2019-12-27 ENCOUNTER — Other Ambulatory Visit: Payer: Self-pay

## 2019-12-27 ENCOUNTER — Ambulatory Visit: Payer: Medicaid Other | Attending: Family Medicine | Admitting: Family Medicine

## 2019-12-27 ENCOUNTER — Encounter: Payer: Self-pay | Admitting: Family Medicine

## 2019-12-27 DIAGNOSIS — I10 Essential (primary) hypertension: Secondary | ICD-10-CM | POA: Diagnosis not present

## 2019-12-27 DIAGNOSIS — R7303 Prediabetes: Secondary | ICD-10-CM

## 2019-12-27 MED ORDER — LOSARTAN POTASSIUM 50 MG PO TABS: 50.0000 mg | ORAL_TABLET | Freq: Every day | ORAL | 1 refills | Status: DC

## 2019-12-27 MED ORDER — METFORMIN HCL 500 MG PO TABS: 500.0000 mg | ORAL_TABLET | Freq: Two times a day (BID) | ORAL | 1 refills | Status: DC

## 2019-12-27 NOTE — Progress Notes (Signed)
Virtual Visit via Telephone Note  I connected with Emily Hendricks on 12/27/19 at  1:30 PM EDT by telephone and verified that I am speaking with the correct person using two identifiers.   I discussed the limitations, risks, security and privacy concerns of performing an evaluation and management service by telephone and the availability of in person appointments. I also discussed with the patient that there may be a patient responsible charge related to this service. The patient expressed understanding and agreed to proceed.  Patient Location: Home Provider Location: [facility] Others participating in call: Interpreter and patient's daughter    History of Present Illness:        64 yo female with chronic medical issues including Hypertension, History of right breast cancer, hyperlipidemia and pre-diabetes. Patient has been having headaches at the billateral temples but after further questioning, daughter, who acts as interpreter, states that these headaches have been chronic and are not a new issue. (Interpreter was initially on the phone but patient's daughter felt that she could interpret without help from the medical interpreter.  Call was somehow disconnected between patient and interpreter and daughter stated that she would call her mother and act as interpreter.  Daughter and patient may have been on different phone lines within the same household during visit).  Patient is having no new issues at today's visit and needs refills of losartan for continued control of hypertension as well as refill of Metformin for continued control of blood sugars.  Patient is having no increased thirst, blurred vision or urinary frequency.  No abdominal pain, nausea or diarrhea associated with use of Metformin.  No new issues with headaches or dizziness related to blood pressure.  Past Medical History:  Diagnosis Date  . Diabetes mellitus without complication ([place])   . Family history of cancer   .  Hyperlipidemia   . Hypertension     Past Surgical History:  Procedure Laterality Date  . BREAST LUMPECTOMY WITH RADIOACTIVE SEED AND SENTINEL LYMPH NODE BIOPSY Right 08/31/2018   Procedure: RIGHT BREAST LUMPECTOMY WITH RADIOACTIVE SEED AND SENTINEL LYMPH NODE BIOPSY;  Surgeon: Stark Klein, MD;  Location: [facility];  Service: General;  Laterality: Right;  . NO PAST SURGERIES    . TUBAL LIGATION      Family History  Problem Relation Age of Onset  . Cancer Mother 52       unknown cancer, in her lower abdomen  . Cancer Sister 73       unknown cancer in her lower abdomen  . Heart disease Brother   . Cancer Niece        unknown form of cancer    Social History   Tobacco Use  . Smoking status: Never Smoker  . Smokeless tobacco: Never Used  Vaping Use  . Vaping Use: Never used  Substance Use Topics  . Alcohol use: No  . Drug use: No     Allergies  Allergen Reactions  . Lisinopril Cough       Observations/Objective: No vital signs or physical exam conducted as visit was done via telephone  Assessment and Plan: 1. Essential hypertension Refill provided of losartan for continued control of hypertension.  Home monitoring of blood pressure encouraged. - losartan (COZAAR) 50 MG tablet; Take 1 tablet (50 mg total) by mouth daily.  Dispense: 90 tablet; Refill: 1  2. Pre-diabetes Patient without any current symptoms and no issues tolerating Metformin.  Refill of Metformin provided and an office follow-up/lab work  encouraged for future visit.  Hemoglobin A1c was 6.2 on 09/20/2019.  - metFORMIN (GLUCOPHAGE) 500 MG tablet; Take 1 tablet (500 mg total) by mouth 2 (two) times daily with a meal.  Dispense: 180 tablet; Refill: 1  3.  Language barrier Medical interpretation service was initially used but daughter requested to be the interpreter for today's visit and stated that her mother agreed that daughter could act as interpreter.  Follow Up Instructions:Return in about  4 months (around 04/28/2020) for Chronic issues-follow-up with primary care provider.    I discussed the assessment and treatment plan with the patient. The patient was provided an opportunity to ask questions and all were answered. The patient agreed with the plan and demonstrated an understanding of the instructions.   The patient was advised to call back or seek an in-person evaluation if the symptoms worsen or if the condition fails to improve as anticipated.  I provided 12 minutes of non-face-to-face time during this encounter.   Antony Blackbird, MD

## 2019-12-28 ENCOUNTER — Ambulatory Visit
Admission: RE | Admit: 2019-12-28 | Discharge: 2019-12-28 | Disposition: A | Discharge status: Home / Self Care | Payer: Medicaid Other | Source: Ambulatory Visit | Attending: Oncology | Admitting: Oncology

## 2019-12-28 ENCOUNTER — Other Ambulatory Visit: Payer: Self-pay

## 2019-12-28 DIAGNOSIS — Z1379 Encounter for other screening for genetic and chromosomal anomalies: Secondary | ICD-10-CM

## 2019-12-28 DIAGNOSIS — C50211 Malignant neoplasm of upper-inner quadrant of right female breast: Secondary | ICD-10-CM

## 2019-12-28 DIAGNOSIS — Z170 Estrogen receptor positive status [ER+]: Secondary | ICD-10-CM

## 2019-12-28 HISTORY — DX: Personal history of irradiation: Z92.3

## 2019-12-28 HISTORY — DX: Malignant (primary) neoplasm, unspecified: C80.1

## 2020-01-01 ENCOUNTER — Other Ambulatory Visit: Payer: Self-pay | Admitting: Nurse Practitioner

## 2020-01-01 ENCOUNTER — Other Ambulatory Visit: Payer: Self-pay | Admitting: Family Medicine

## 2020-01-01 ENCOUNTER — Other Ambulatory Visit: Payer: Medicaid Other

## 2020-01-01 DIAGNOSIS — D72819 Decreased white blood cell count, unspecified: Secondary | ICD-10-CM

## 2020-01-01 DIAGNOSIS — E785 Hyperlipidemia, unspecified: Secondary | ICD-10-CM

## 2020-01-01 DIAGNOSIS — Z114 Encounter for screening for human immunodeficiency virus [HIV]: Secondary | ICD-10-CM

## 2020-01-01 DIAGNOSIS — Z1159 Encounter for screening for other viral diseases: Secondary | ICD-10-CM

## 2020-01-01 DIAGNOSIS — I10 Essential (primary) hypertension: Secondary | ICD-10-CM

## 2020-01-01 NOTE — Telephone Encounter (Signed)
Requested medication (s) are due for refill today: yes  Requested medication (s) are on the active medication list: yes  Last refill:  08/31/19  Future visit scheduled: no  Notes to clinic:  med not assigned to a protocol   Requested Prescriptions  Pending Prescriptions Disp Refills   anastrozole (ARIMIDEX) 1 MG tablet [Pharmacy Med Name: ANASTROZOLE 1MG  TAB] 30 tablet 0    Sig: Take 1 tablet (1 mg total) by mouth daily.      Off-Protocol Failed - 01/01/2020  4:31 PM      Failed - Medication not assigned to a protocol, review manually.      Passed - Valid encounter within last 12 months    Recent Outpatient Visits           5 days ago Essential hypertension   [place], MD   3 months ago Essential hypertension   [place], [place], NP   8 months ago Postmenopausal bleeding   [place], [place], NP   9 months ago Tinea pedis of both [place], Vernia Buff, NP   1 year ago [place], Vernia Buff, NP             Off-Protocol Failed - 01/01/2020  4:31 PM      Failed - Medication not assigned to a protocol, review manually.      Passed - Valid encounter within last 12 months    Recent Outpatient Visits           5 days ago Essential hypertension   [place], MD   3 months ago Essential hypertension   [place], [place], NP   8 months ago Postmenopausal bleeding   [place], [place], NP   9 months ago Tinea pedis of both feet   [place], Vernia Buff, NP   1 year ago [place], Vernia Buff, NP

## 2020-01-02 ENCOUNTER — Inpatient Hospital Stay: Payer: Medicaid Other | Admitting: Adult Health

## 2020-01-02 ENCOUNTER — Inpatient Hospital Stay: Payer: Medicaid Other | Attending: Oncology

## 2020-01-02 ENCOUNTER — Telehealth: Payer: Self-pay

## 2020-01-02 NOTE — Telephone Encounter (Signed)
Attempted to call pt d/t /NS for appt today. Pt did not answer. LVM for pt to return call and r/s appt. Also sent letter to pt via mychart.

## 2020-01-02 NOTE — Progress Notes (Deleted)
[facility]  [phone]) [phone] [phone]) [phone]    ID: Emily Hendricks DOB: 03/06/1956  MR#: [id number]  JSH#:[id number]  Patient Care Team: Gildardo Pounds, NP as PCP - General (Nurse Practitioner) Mauro Kaufmann, RN as Oncology Nurse Navigator Rockwell Germany, RN as Oncology Nurse Navigator Stark Klein, MD as Consulting Physician (General Surgery) Magrinat, Virgie Dad, MD as Consulting Physician (Oncology) Gery Pray, MD as Consulting Physician (Radiation Oncology) OTHER MD:   CHIEF COMPLAINT: Estrogen receptor positive breast cancer  CURRENT TREATMENT: Anastrozole   INTERVAL HISTORY: Emily Hendricks returns today for follow-up of her strogen receptor positive breast cancer accompanied by an interpreter, Emily Hendricks.  The patient's daughter also participated by speaker phone  The patient continues on anastrozole. She is having no side effects from it that she is aware of, specifically hot flashes are not a major issue.  She underwent bone density on 12/28/2019 that showed osteoporosis with a T score of -3.1 in her L1-L4 spine.  She underwent a mammogram on 12/27/2019 that showed no evidence of malignancy and breast density category C.   REVIEW OF SYSTEMS: Emily Hendricks h  HISTORY OF CURRENT ILLNESS: From the original intake note:  Emily Hendricks had a right breast lump saometime in March 2020; she went to [facility] to be evaluated.  She underwent bilateral diagnostic mammography with tomography and right breast ultrasonography at [facility] on 06/29/2018 showing: Breast Density Category C. No dominant masses, suspicious calcifications or secondary signs of malignancy are identified within the LEFT breast.   There is a spiculated mass within the upper slightly inner RIGHT breast, at far posterior depth, measuring approximately 2.2 cm greatest dimension, corresponding to the area of clinical concern. No dominant masses, suspicious  calcifications or secondary signs of malignancy are identified elsewhere within the RIGHT breast. . Targeted ultrasound is performed, showing an irregular hypoechoic mass in the RIGHT breast at the 12:30 o'clock axis, 8 cm from the nipple, measuring 2.7 x 1.7 x 1.9 cm. The mass abuts and may involve the pectoralis muscle. RIGHT axilla was evaluated with ultrasound showing no enlarged or morphologically abnormal lymph nodes.  Accordingly on 07/03/2018 she proceeded to biopsy of the right breast area in question. 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 Prognostic indicators significant for: estrogen receptor, 95% positive with strong staining intensity and progesterone receptor, 10% positive with weak staining intensity. Proliferation marker Ki67 at 5%. HER2 equivocal (2+) by immunohistochemistry but reportedly negative by fluorescent in situ hybridization.  The patient's subsequent history is as detailed below.   PAST MEDICAL HISTORY: Past Medical History:  Diagnosis Date  . Breast cancer ([place]) 2020  . Cancer ([place])   . Diabetes mellitus without complication ([place])   . Family history of cancer   . Hyperlipidemia   . Hypertension   . Personal history of radiation therapy     PAST SURGICAL HISTORY: Past Surgical History:  Procedure Laterality Date  . BREAST LUMPECTOMY    . BREAST LUMPECTOMY WITH RADIOACTIVE SEED AND SENTINEL LYMPH NODE BIOPSY Right 08/31/2018   Procedure: RIGHT BREAST LUMPECTOMY WITH RADIOACTIVE SEED AND SENTINEL LYMPH NODE BIOPSY;  Surgeon: Stark Klein, MD;  Location: [facility];  Service: General;  Laterality: Right;  . NO PAST SURGERIES    . TUBAL LIGATION    Tubal ligation in 1990 or 1991.    FAMILY HISTORY: Family History  Problem Relation Age of Onset  . Cancer  Mother 47       unknown cancer, in her lower abdomen  . Cancer Sister 37       unknown cancer in her  lower abdomen  . Heart disease Brother   . Cancer Niece        unknown form of cancer   Emily Hendricks father died from heart complications following a heart surgery about 45 years ago. Patients' mother died from unknown cancer in her 38's. The patient has 6 brothers and 1 sisters. Her sister had cancer of unknown type, but that was in her lower abdomen. Patient denies anyone else in her family having cancer.     GYNECOLOGIC HISTORY:  No LMP recorded. Patient is postmenopausal. 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 She moved here from [place], [place] in 2005. Her husband is partially disabled. At home with them is her mother-in-law, her son, and her daughter-in-law. Shaquan's son's name is Emily Hendricks.  He is a Freight forwarder at [employer].  His wife is a Marine scientist.  The patient's daughter, Emily Hendricks, lives in [place] with her husband  ADVANCED DIRECTIVES: In the absence of any documentation, Tashe's spouse, is her healthcare power of attorney. The appropriate documentation is provided on 07/12/2018 for her to review, notarize, and return and her earliest convenience.  The patient intends to name her daughter Emily Hendricks as her healthcare power of attorney.  She also has the daughter's phone number as her primary contact   HEALTH MAINTENANCE: Social History   Tobacco Use  . Smoking status: Never Smoker  . Smokeless tobacco: Never Used  Vaping Use  . Vaping Use: Never used  Substance Use Topics  . Alcohol use: No  . Drug use: No    Colonoscopy:   PAP:   Bone density:   Allergies  Allergen Reactions  . Lisinopril Cough    Current Outpatient Medications  Medication Sig Dispense Refill  . acetaminophen (TYLENOL) 500 MG tablet Take by mouth every 6 (six) hours as  needed. Pt does not know dosage    . anastrozole (ARIMIDEX) 1 MG tablet Take 1 tablet (1 mg total) by mouth daily. 30 tablet 0  . Cholecalciferol (VITAMIN D) 2000 units tablet Take 1 tablet (2,000 Units total) by mouth daily. 30 tablet 5  . ibuprofen (ADVIL,MOTRIN) 600 MG tablet Take 1 tablet (600 mg total) by mouth every 8 (eight) hours as needed for headache. Take with food. 40 tablet 0  . losartan (COZAAR) 50 MG tablet Take 1 tablet (50 mg total) by mouth daily. 90 tablet 1  . metFORMIN (GLUCOPHAGE) 500 MG tablet Take 1 tablet (500 mg total) by mouth 2 (two) times daily with a meal. 180 tablet 1  . simvastatin (ZOCOR) 20 MG tablet TAKE ONE TABLET BY MOUTH DAILY *MUST HAVE OFFICE VISIT FOR FURTHER REFILLS* 30 tablet 1  . triamcinolone cream (KENALOG) 0.5 % Apply 1 application topically 2 (two) times daily. 60 g 0   No current facility-administered medications for this visit.    OBJECTIVE:  [place] woman in no acute distress  There were no vitals filed for this visit. Wt Readings from Last 3 Encounters:  09/20/19 136 lb 3.2 oz (61.8 kg)  07/03/19 134 lb 6.4 oz (61 kg)  04/09/19 134 lb 12.8 oz (  61.1 kg)   There is no height or weight on file to calculate BMI.    ECOG FS:1 - Symptomatic but completely ambulatory  Sclerae unicteric, EOMs intact Wearing a mask No cervical or supraclavicular adenopathy Lungs no rales or rhonchi Heart regular rate and rhythm Abd soft, nontender, positive bowel sounds MSK no focal spinal tenderness, no upper extremity lymphedema Neuro: nonfocal, well oriented, appropriate affect Breasts: The right breast is status post lumpectomy and radiation.  It is slightly hyperpigmented and definitely firmer than the left breast but there is no evidence of local recurrence.  The left breast is benign.  Both axillae are benign.   LAB RESULTS:  CMP     Component Value Date/Time   NA 143 07/03/2019 0920   NA 142 06/06/2018 1639   K 4.0 07/03/2019 0920   CL  108 07/03/2019 0920   CO2 26 07/03/2019 0920   GLUCOSE 94 07/03/2019 0920   BUN 14 07/03/2019 0920   BUN 15 06/06/2018 1639   CREATININE 0.75 07/03/2019 0920   CREATININE 0.83 07/12/2018 0848   CREATININE 0.78 02/03/2016 1613   CALCIUM 9.1 07/03/2019 0920   PROT 7.1 07/03/2019 0920   PROT 6.5 06/06/2018 1639   ALBUMIN 3.9 07/03/2019 0920   ALBUMIN 4.3 06/06/2018 1639   AST 17 07/03/2019 0920   AST 17 07/12/2018 0848   ALT 13 07/03/2019 0920   ALT 12 07/12/2018 0848   ALKPHOS 92 07/03/2019 0920   BILITOT 0.5 07/03/2019 0920   BILITOT 0.5 07/12/2018 0848   GFRNONAA >60 07/03/2019 0920   GFRNONAA >60 07/12/2018 0848   GFRNONAA 82 02/03/2016 1613   GFRAA >60 07/03/2019 0920   GFRAA >60 07/12/2018 0848   GFRAA >89 02/03/2016 1613    No results found for: TOTALPROTELP, ALBUMINELP, A1GS, A2GS, BETS, BETA2SER, GAMS, MSPIKE, SPEI  No results found for: KPAFRELGTCHN, LAMBDASER, KAPLAMBRATIO  Lab Results  Component Value Date   WBC 3.4 (L) 07/03/2019   NEUTROABS 2.2 07/03/2019   HGB 11.6 (L) 07/03/2019   HCT 37.7 07/03/2019   MCV 85.1 07/03/2019   PLT 213 07/03/2019    No results found for: LABCA2  No components found for: PJASNK539  No results for input(s): INR in the last 168 hours.  No results found for: LABCA2  No results found for: JQB341  No results found for: PFX902  No results found for: IOX735  No results found for: CA2729  No components found for: HGQUANT  No results found for: CEA1 / No results found for: CEA1   No results found for: AFPTUMOR  No results found for: CHROMOGRNA  No results found for: HGBA, HGBA2QUANT, HGBFQUANT, HGBSQUAN (Hemoglobinopathy evaluation)   No results found for: LDH  No results found for: IRON, TIBC, IRONPCTSAT (Iron and TIBC)  No results found for: FERRITIN  Urinalysis No results found for: COLORURINE, APPEARANCEUR, LABSPEC, PHURINE, GLUCOSEU, HGBUR, BILIRUBINUR, KETONESUR, PROTEINUR, UROBILINOGEN, NITRITE,  LEUKOCYTESUR   STUDIES:  DG BONE DENSITY (DXA)  Result Date: 12/29/2019 EXAM: DUAL X-RAY ABSORPTIOMETRY (DXA) FOR BONE MINERAL DENSITY IMPRESSION: Referring Physician:  Chauncey Cruel Your patient completed a BMD test using Lunar IDXA DXA system ( analysis version: 16 ) manufactured by [employer]. Technologist: AW PATIENT: Name: Emily Hendricks, Emily Hendricks Patient ID: [id number] Birth Date: 1956/01/04 Height: 59.5 in. Sex: Female Measured: 12/28/2019 Weight: 137.2 lbs. Indications: Anastrazole, Breast Cancer History, Caucasian, Diabetic non insulin, Estrogen Deficient, Postmenopausal Fractures: None Treatments: Calcium (E943.0), Vitamin D (E933.5) ASSESSMENT: The BMD measured at AP Spine L1-L4 is  0.809 g/cm2 with a T-score of -3.1. This patient is considered osteoporotic according to [facility]) criteria. The scan quality is good. Site Region Measured Date Measured Age YA BMD Significant CHANGE T-score AP Spine  L1-L4      11/28/2019    65.3         -3.1    0.809 g/cm2 DualFemur Total Left 12/28/2019    65.4         -2.4    0.707 g/cm2 DualFemur Total Mean 12/28/2019    65.4         -2.0    0.751 g/cm2 World Health Organization [facility]) criteria for post-menopausal, Caucasian Women: Normal       T-score at or above -1 SD Osteopenia   T-score between -1 and -2.5 SD Osteoporosis T-score at or below -2.5 SD RECOMMENDATION: 1. All patients should optimize calcium and vitamin D intake. 2. Consider FDA approved medical therapies in postmenopausal women and men aged 72 years and older, based on the following: a. A hip or vertebral (clinical or morphometric) fracture b. T- score < or = -2.5 at the femoral neck or spine after appropriate evaluation to exclude secondary causes c. Low bone mass (T-score between -1.0 and -2.5 at the femoral neck or spine) and a 10 year probability of a hip fracture > or = 3% or a 10 year probability of a major osteoporosis-related fracture > or = 20% based on the [place]-adapted  WHO algorithm d. Clinician judgment and/or patient preferences may indicate treatment for people with 10-year fracture probabilities above or below these levels FOLLOW-UP: Patients with diagnosis of osteoporosis or at high risk for fracture should have regular bone mineral density tests. For patients eligible for Medicare, routine testing is allowed once every 2 years. The testing frequency can be increased to one year for patients who have rapidly progressing disease, those who are receiving or discontinuing medical therapy to restore bone mass, or have additional risk factors. I have reviewed this report and agree with the above findings. [facility] Radiology Electronically Signed   By: Lovey Newcomer M.D.   On: 12/29/2019 20:30   MM DIAG BREAST TOMO BILATERAL  Result Date: 12/28/2019 CLINICAL DATA:  Status post RIGHT lumpectomy in 2020. EXAM: DIGITAL DIAGNOSTIC BILATERAL MAMMOGRAM WITH TOMO AND CAD COMPARISON:  Previous exam(s). ACR Breast Density Category c: The breast tissue is heterogeneously dense, which may obscure small masses. FINDINGS: Post operative changes are seen in the RIGHTbreast. No suspicious mass, distortion, or microcalcifications are identified to suggest presence of malignancy. Mammographic images were processed with CAD. IMPRESSION: No mammographic evidence for malignancy. RECOMMENDATION: Diagnostic mammogram is suggested in 1 year. (Code:DM-B-01Y) I have discussed the findings and recommendations with the patient with the assistance of an interpreter. If applicable, a reminder letter will be sent to the patient regarding the next appointment. BI-RADS CATEGORY  2: Benign. Electronically Signed   By: Nolon Nations M.D.   On: 12/28/2019 17:06     ELIGIBLE FOR AVAILABLE RESEARCH PROTOCOL: no   ASSESSMENT: 64 y.o. [place] woman status post right breast biopsy 07/03/2018 for a clinical T2 N0, stage IB invasive ductal carcinoma, grade 2, estrogen receptor strongly  positive, progesterone receptor weakly positive, with no HER-2 amplification and an MIB-1 of 5%.  (1) neoadjuvant anastrozole started 07/12/2018, interrupted during radiation, resumed 11/28/2018  (a) bone density 12/28/2019 shows osteoporosis with T score of -3.1.  (2) status post right lumpectomy and sentinel lymph node sampling 08/31/2018 for  a pT2 pN0, stage IB invasive ductal carcinoma, grade 2, with close but negative margins  (a) a total of 3 sentinel lymph nodes removed  (3) MammaPrint shows a luminal type low risk tumor, predicting a 97.8% chance of being alive without distant recurrence at 5 years with antiestrogen therapy alone [without chemotherapy]  (4) adjuvant radiation 10/05/2018 - 11/22/2018 Site Technique Total Dose Dose per Fx Completed Fx Beam Energies  Breast: Breast_Rt 3D 50.4/50.4 1.8 28/28 6X  Breast: Breast_Rt_axilla 3D 45/45 1.8 25/25 6X, 10X  Breast: Breast_Rt_Bst 3D 12/12 2 6/6 6X, 10X    (5) Genetic testing on 07/12/2018 through the Common Hereditary Gene Panel offered by [facility] found no pathogenic mutations in APC, ATM, AXIN2, BARD1, BMPR1A, BRCA1, BRCA2, BRIP1, CDH1, CDK4, CDKN2A (p14ARF), CDKN2A (p16INK4a), CHEK2, CTNNA1, DICER1, EPCAM (Deletion/duplication testing only), GREM1 (promoter region deletion/duplication testing only), KIT, MEN1, MLH1, MSH2, MSH3, MSH6, MUTYH, NBN, NF1, NHTL1, PALB2, PDGFRA, PMS2, POLD1, POLE, PTEN, RAD50, RAD51C, RAD51D, RNF43, SDHB, SDHC, SDHD, SMAD4, SMARCA4. STK11, TP53, TSC1, TSC2, and VHL.  The following genes were evaluated for sequence changes only: SDHA and HOXB13 c.251G>A variant only.   PLAN:  Harris is now just about a year out from definitive surgery for her breast cancer with no evidence of disease recurrence.  This is very favorable.  She continues on anastrozole with good tolerance and the plan is to continue that a minimum of 5 years.  She is due for mammography and a bone density.  Those orders are in.  I  alerted her daughter to watch out for that phone call so they can get it done this month.  I also recommended they received either the [place] or the majority of vaccine.  This needs to be done after the mammogram so there is no confusion regarding adenopathy  She will see [place] again in 6 months and then again in a year; at that point we will start seeing her on a yearly basis  Total encounter time 30 minutes.Wilber Bihari, NP 01/02/20 8:29 AM Medical Oncology and Hematology [facility] [place], [place] [address] Tel. [phone]    Fax. [phone]     *Total Encounter Time as defined by the Centers for Medicare and Medicaid Services includes, in addition to the face-to-face time of a patient visit (documented in the note above) non-face-to-face time: obtaining and reviewing outside history, ordering and reviewing medications, tests or procedures, care coordination (Hendricks with other health care professionals or caregivers) and documentation in the medical record.

## 2020-02-06 ENCOUNTER — Other Ambulatory Visit: Payer: Self-pay | Admitting: Family

## 2020-02-06 ENCOUNTER — Other Ambulatory Visit: Payer: Self-pay | Admitting: Nurse Practitioner

## 2020-02-06 DIAGNOSIS — E785 Hyperlipidemia, unspecified: Secondary | ICD-10-CM

## 2020-02-06 NOTE — Telephone Encounter (Signed)
Requested medication (s) are due for refill today -yes  Requested medication (s) are on the active medication list -yes  Future visit scheduled -no  Last refill: per Rx list- 01/06/20  Notes to clinic: Request medication not assigned to protocol  Requested Prescriptions  Pending Prescriptions Disp Refills   anastrozole (ARIMIDEX) 1 MG tablet [Pharmacy Med Name: ANASTROZOLE 1MG  TAB] 30 tablet 0    Sig: TAKE ONE TABLET BY MOUTH DAILY      Off-Protocol Failed - 02/06/2020  2:01 PM      Failed - Medication not assigned to a protocol, review manually.      Passed - Valid encounter within last 12 months    Recent Outpatient Visits           1 month ago Essential hypertension   [place], MD   4 months ago Essential hypertension   [place], [place], NP   10 months ago Postmenopausal bleeding   [place], [place], NP   11 months ago Tinea pedis of both [place], Vernia Buff, NP   1 year ago [place], Vernia Buff, NP             Off-Protocol Failed - 02/06/2020  2:01 PM      Failed - Medication not assigned to a protocol, review manually.      Passed - Valid encounter within last 12 months    Recent Outpatient Visits           1 month ago Essential hypertension   [place], MD   4 months ago Essential hypertension   [place], [place], NP   10 months ago Postmenopausal bleeding   [place], [place], NP   11 months ago Tinea pedis of both [place], Vernia Buff, NP   1 year ago [place], Vernia Buff, NP                  Requested  Prescriptions  Pending Prescriptions Disp Refills   anastrozole (ARIMIDEX) 1 MG tablet [Pharmacy Med Name: ANASTROZOLE 1MG  TAB] 30 tablet 0    Sig: TAKE ONE TABLET BY MOUTH DAILY      Off-Protocol Failed - 02/06/2020  2:01 PM      Failed - Medication not assigned to a protocol, review manually.      Passed - Valid encounter within last 12 months    Recent Outpatient Visits           1 month ago Essential hypertension   [place], MD   4 months ago Essential hypertension   [place], [place], NP   10 months ago Postmenopausal bleeding   [place], [place], NP   11 months ago Tinea pedis of both feet   [place], Vernia Buff, NP   1 year ago [place], Zelda W, NP             Off-Protocol Failed - 02/06/2020  2:01 PM      Failed - Medication not assigned to a protocol, review manually.      Passed - Valid encounter within last 12 months    Recent Outpatient Visits           1 month ago Essential hypertension   [place], MD   4 months ago Essential hypertension   [place], [place], NP   10 months ago Postmenopausal bleeding   [place], [place], NP   11 months ago Tinea pedis of both feet   [place], Vernia Buff, NP   1 year ago [place], Vernia Buff, NP

## 2020-03-05 ENCOUNTER — Other Ambulatory Visit: Payer: Self-pay | Admitting: Nurse Practitioner

## 2020-03-05 DIAGNOSIS — E785 Hyperlipidemia, unspecified: Secondary | ICD-10-CM

## 2020-03-05 NOTE — Telephone Encounter (Signed)
Requested medication (s) are due for refill today:  Yes  Requested medication (s) are on the active medication list:  Yes  Future visit scheduled:  No  Last Refill: 02/08/20; #30; no refills  Notes to clinic:  Attempted to call pt., With asst. Of [employer] Interpreter ,to try and schedule for overdue lipid panel, and 4 mo. F/u that is due in February 2022.  Voice mail box full; unable to reach or leave a message.   Has already had a courtesy refill.     Requested Prescriptions  Pending Prescriptions Disp Refills   simvastatin (ZOCOR) 20 MG tablet [Pharmacy Med Name: SIMVASTATIN 20MG  TAB] 30 tablet 0    Sig: TAKE ONE TABLET BY MOUTH DAILY *MUST HAVE OFFICE VISIT FOR FURTHER REFILLS*      Cardiovascular:  Antilipid - Statins Failed - 03/05/2020  3:00 PM      Failed - Total Cholesterol in normal range and within 360 days    Cholesterol, Total  Date Value Ref Range Status  06/06/2018 175 100 - 199 mg/dL Final          Failed - LDL in normal range and within 360 days    LDL Calculated  Date Value Ref Range Status  06/06/2018 102 (H) 0 - 99 mg/dL Final          Failed - HDL in normal range and within 360 days    HDL  Date Value Ref Range Status  06/06/2018 56 >39 mg/dL Final          Failed - Triglycerides in normal range and within 360 days    Triglycerides  Date Value Ref Range Status  06/06/2018 85 0 - 149 mg/dL Final          Passed - Patient is not pregnant      Passed - Valid encounter within last 12 months    Recent Outpatient Visits           2 months ago Essential hypertension   [place], MD   5 months ago Essential hypertension   [place], [place], NP   11 months ago Postmenopausal bleeding   [place], [place], NP   11 months ago Tinea pedis of both feet   [place], Vernia Buff, NP   1 year ago  [place], Vernia Buff, NP

## 2020-04-04 ENCOUNTER — Other Ambulatory Visit: Payer: Self-pay | Admitting: Family Medicine

## 2020-04-04 NOTE — Telephone Encounter (Signed)
This medication does not have an assigned protocol therefore sending it back to provider.    Requested Prescriptions  Pending Prescriptions Disp Refills   anastrozole (ARIMIDEX) 1 MG tablet [Pharmacy Med Name: ANASTROZOLE 1MG  TAB] 30 tablet 0    Sig: Take 1 tablet (1 mg total) by mouth daily. Must have office visit for additional refills.      Off-Protocol Failed - 04/04/2020  1:37 PM      Failed - Medication not assigned to a protocol, review manually.      Passed - Valid encounter within last 12 months    Recent Outpatient Visits           3 months ago Essential hypertension   [place], MD   6 months ago Essential hypertension   [place], [place], NP   12 months ago Postmenopausal bleeding   [place], [place], NP   1 year ago Tinea pedis of both [place], Zelda W, NP   1 year ago [place], Zelda W, NP               Off-Protocol Failed - 04/04/2020  1:37 PM      Failed - Medication not assigned to a protocol, review manually.      Passed - Valid encounter within last 12 months    Recent Outpatient Visits           3 months ago Essential hypertension   [place], MD   6 months ago Essential hypertension   [place], [place], NP   12 months ago Postmenopausal bleeding   [place], [place], NP   1 year ago Tinea pedis of both [place], Vernia Buff, NP   1 year ago [place], Vernia Buff, NP

## 2020-04-10 ENCOUNTER — Other Ambulatory Visit: Payer: Self-pay | Admitting: Family Medicine

## 2020-04-10 ENCOUNTER — Other Ambulatory Visit: Payer: Self-pay | Admitting: Nurse Practitioner

## 2020-04-10 DIAGNOSIS — E785 Hyperlipidemia, unspecified: Secondary | ICD-10-CM

## 2020-04-10 NOTE — Telephone Encounter (Signed)
Requested medication (s) are due for refill today: Yes  Requested medication (s) are on the active medication list: Yes  Last refill:  02/06/20 and 03/05/20  Future visit scheduled: No  Notes to clinic:  Unable to refill, courtesy refill given and OV needed, not assigned to protocol     Requested Prescriptions  Pending Prescriptions Disp Refills   anastrozole (ARIMIDEX) 1 MG tablet [Pharmacy Med Name: ANASTROZOLE 1MG  TAB] 30 tablet 0    Sig: TAKE ONE TABLET BY MOUTH DAILY      Off-Protocol Failed - 04/10/2020 11:17 AM      Failed - Medication not assigned to a protocol, review manually.      Passed - Valid encounter within last 12 months    Recent Outpatient Visits           3 months ago Essential hypertension   [place], MD   6 months ago Essential hypertension   [place], [place], NP   1 year ago Postmenopausal bleeding   [place], [place], NP   1 year ago Tinea pedis of both [place], Zelda W, NP   1 year ago [place], Vernia Buff, NP               Off-Protocol Failed - 04/10/2020 11:17 AM      Failed - Medication not assigned to a protocol, review manually.      Passed - Valid encounter within last 12 months    Recent Outpatient Visits           3 months ago Essential hypertension   [place], MD   6 months ago Essential hypertension   [place], [place], NP   1 year ago Postmenopausal bleeding   [place], [place], NP   1 year ago Tinea pedis of both [place], Vernia Buff, NP   1 year ago [place], [place] W, NP                   simvastatin (ZOCOR) 20 MG tablet [Pharmacy Med Name: SIMVASTATIN 20MG  TAB] 30 tablet 0    Sig: TAKE ONE TABLET BY MOUTH DAILY *MUST HAVE OFFICE VISIT FOR FURTHER REFILLS*      Cardiovascular:  Antilipid - Statins Failed - 04/10/2020 11:17 AM      Failed - Total Cholesterol in normal range and within 360 days    Cholesterol, Total  Date Value Ref Range Status  06/06/2018 175 100 - 199 mg/dL Final          Failed - LDL in normal range and within 360 days    LDL Calculated  Date Value Ref Range Status  06/06/2018 102 (H) 0 - 99 mg/dL Final          Failed - HDL in normal range and within 360 days    HDL  Date Value Ref Range Status  06/06/2018 56 >39 mg/dL Final          Failed - Triglycerides in normal range and within 360 days    Triglycerides  Date Value Ref Range Status  06/06/2018 85 0 -  149 mg/dL Final          Passed - Patient is not pregnant      Passed - Valid encounter within last 12 months    Recent Outpatient Visits           3 months ago Essential hypertension   [place], MD   6 months ago Essential hypertension   [place], [place], NP   1 year ago Postmenopausal bleeding   [place], [place], NP   1 year ago Tinea pedis of both [place], Vernia Buff, NP   1 year ago [place], Vernia Buff, NP

## 2020-04-15 ENCOUNTER — Telehealth: Payer: Self-pay | Admitting: Nurse Practitioner

## 2020-04-15 ENCOUNTER — Other Ambulatory Visit: Payer: Self-pay | Admitting: *Deleted

## 2020-04-15 MED ORDER — ANASTROZOLE 1 MG PO TABS: 1.0000 mg | ORAL_TABLET | Freq: Every day | ORAL | 4 refills | Status: DC

## 2020-04-15 NOTE — Telephone Encounter (Signed)
Copied from [place] [id number]. Topic: General - Inquiry >> Apr 15, 2020 12:16 PM Greggory Keen D wrote: Pt's daughter called saying mom does not understand why her two medication request have not been sent back in to the pharmacy to be filled.    CB#  [phone]

## 2020-04-15 NOTE — Telephone Encounter (Signed)
She has not been seen since 09/2019. Additionally, they are requesting refills on anastrozole which should be send in by her Oncologist. They should contact the Oncologist for this rx.

## 2020-04-17 ENCOUNTER — Telehealth: Payer: Self-pay | Admitting: Adult Health

## 2020-04-17 NOTE — Telephone Encounter (Signed)
Called pt per 1/25 sch msg - unable to reach pt. Left message for patient to call back to reschedule appt.

## 2020-05-05 ENCOUNTER — Encounter: Payer: Self-pay | Admitting: Gastroenterology

## 2020-06-10 ENCOUNTER — Other Ambulatory Visit: Payer: Self-pay | Admitting: Nurse Practitioner

## 2020-06-10 DIAGNOSIS — R7303 Prediabetes: Secondary | ICD-10-CM

## 2020-06-10 DIAGNOSIS — E785 Hyperlipidemia, unspecified: Secondary | ICD-10-CM

## 2020-06-10 NOTE — Telephone Encounter (Signed)
Medication Refill - Medication: Pt's son called reporting that mother has stated that she is out of her blood pressure medication, and cholesterol medication. Please advise  simvastatin (ZOCOR) 20 MG tablet Also is requesting Lisinopril -pt's son does not recall the names of the other Rxs.  Has the patient contacted their pharmacy? Yes.   (Agent: If no, request that the patient contact the pharmacy for the refill.) (Agent: If yes, when and what did the pharmacy advise?)  Preferred Pharmacy (with phone number or street name):  [facility], [address]  [place] [address]  Phone: [phone] Fax: [phone]     Agent: Please be advised that RX refills may take up to 3 business days. We ask that you follow-up with your pharmacy.

## 2020-06-10 NOTE — Telephone Encounter (Signed)
Notes to clinic: Patient is requesting Lisinopril but is not on current medication list  Review supply until appointment 08/26/2020   Requested Prescriptions  Pending Prescriptions Disp Refills   simvastatin (ZOCOR) 20 MG tablet 30 tablet 0    Sig: TAKE ONE TABLET BY MOUTH DAILY *MUST HAVE OFFICE VISIT FOR FURTHER REFILLS*      Cardiovascular:  Antilipid - Statins Failed - 06/10/2020  2:00 PM      Failed - Total Cholesterol in normal range and within 360 days    Cholesterol, Total  Date Value Ref Range Status  06/06/2018 175 100 - 199 mg/dL Final          Failed - LDL in normal range and within 360 days    LDL Calculated  Date Value Ref Range Status  06/06/2018 102 (H) 0 - 99 mg/dL Final          Failed - HDL in normal range and within 360 days    HDL  Date Value Ref Range Status  06/06/2018 56 >39 mg/dL Final          Failed - Triglycerides in normal range and within 360 days    Triglycerides  Date Value Ref Range Status  06/06/2018 85 0 - 149 mg/dL Final          Passed - Patient is not pregnant      Passed - Valid encounter within last 12 months    Recent Outpatient Visits           5 months ago Essential hypertension   [place], MD   8 months ago Essential hypertension   [place], [place], NP   1 year ago Postmenopausal bleeding   [place], [place], NP   1 year ago Tinea pedis of both [place], [place] W, NP   2 years ago [place], Vernia Buff, NP       Future Appointments             In 2 months Gildardo Pounds, NP [place]               metFORMIN (GLUCOPHAGE) 500 MG tablet 180 tablet 1    Sig: Take 1 tablet (500 mg total) by mouth 2 (two) times daily with a meal.      Endocrinology:   Diabetes - Biguanides Failed - 06/10/2020  2:00 PM      Failed - HBA1C is between 0 and 7.9 and within 180 days    HbA1c, POC (prediabetic range)  Date Value Ref Range Status  09/20/2019 6.2 5.7 - 6.4 % Final          Passed - Cr in normal range and within 360 days    Creatinine  Date Value Ref Range Status  07/12/2018 0.83 0.44 - 1.00 mg/dL Final   Creat  Date Value Ref Range Status  02/03/2016 0.78 0.50 - 0.99 mg/dL Final    Comment:      For patients > or = 66 years of age: The upper reference limit for Creatinine is approximately 13% higher for people identified as African-American.      Creatinine, Ser  Date Value Ref Range Status  07/03/2019 0.75 0.44 - 1.00 mg/dL Final  Passed - eGFR in normal range and within 360 days    GFR, Est African American  Date Value Ref Range Status  02/03/2016 >89 >=60 mL/min Final   GFR, Est AFR Am  Date Value Ref Range Status  07/12/2018 >60 >60 mL/min Final   GFR calc Af Amer  Date Value Ref Range Status  07/03/2019 >60 >60 mL/min Final   GFR, Est Non African American  Date Value Ref Range Status  02/03/2016 82 >=60 mL/min Final   GFR, Estimated  Date Value Ref Range Status  07/12/2018 >60 >60 mL/min Final   GFR calc non Af Amer  Date Value Ref Range Status  07/03/2019 >60 >60 mL/min Final          Passed - Valid encounter within last 6 months    Recent Outpatient Visits           5 months ago Essential hypertension   [place], MD   8 months ago Essential hypertension   [place], [place], NP   1 year ago Postmenopausal bleeding   [place], [place], NP   1 year ago Tinea pedis of both feet   [place], Vernia Buff, NP   2 years ago [place], Vernia Buff, NP       Future Appointments              In 2 months Gildardo Pounds, NP [place]

## 2020-06-11 NOTE — Telephone Encounter (Signed)
Pts son called stating that the pt has been out of her simvastatin  X 1wk. He is requesting to have these sent in today. Please advise.

## 2020-06-12 MED ORDER — METFORMIN HCL 500 MG PO TABS: 500.0000 mg | ORAL_TABLET | Freq: Two times a day (BID) | ORAL | 2 refills | Status: DC

## 2020-06-12 MED ORDER — SIMVASTATIN 20 MG PO TABS
ORAL_TABLET | ORAL | 2 refills | Status: DC
Start: 2020-06-12 — End: 2020-09-15

## 2020-06-30 NOTE — Progress Notes (Incomplete)
[place]  Telephone:([phone] Fax:([phone]) [phone]    ID: Emily Hendricks DOB: 09-16-1955  MR#: [id number]  IWP#:[id number]  Patient Care Team: Gildardo Pounds, NP as PCP - General (Nurse Practitioner) Mauro Kaufmann, RN as Oncology Nurse Navigator Rockwell Germany, RN as Oncology Nurse Navigator Stark Klein, MD as Consulting Physician (General Surgery) Magrinat, Virgie Dad, MD as Consulting Physician (Oncology) Gery Pray, MD as Consulting Physician (Radiation Oncology) OTHER MD:   CHIEF COMPLAINT: Estrogen receptor positive breast cancer  CURRENT TREATMENT: Anastrozole   INTERVAL HISTORY: Emily Hendricks returns today for follow-up of her strogen receptor positive breast cancer accompanied by an interpreter.  The patient continues on anastrozole. She is having no side effects from it that she is aware of, specifically hot flashes are not a major issue.  Since her last visit, she underwent bilateral diagnostic mammography with tomography at [facility] on 12/28/2019 showing: breast density category C; no evidence of malignancy in either breast.   She also underwent bone density screening the same day showing a T-score of -3.1, which is considered osteoporotic.   REVIEW OF SYSTEMS: Emily Hendricks    COVID 19 VACCINATION STATUS:    HISTORY OF CURRENT ILLNESS: From the original intake note:  Emily Hendricks had a right breast lump saometime in March 2020; she went to [facility] to be evaluated.  She underwent bilateral diagnostic mammography with tomography and right breast ultrasonography at [facility] on 06/29/2018 showing: Breast Density Category C. No dominant masses, suspicious calcifications or secondary signs of malignancy are identified within the LEFT breast.   There is a spiculated mass within the upper slightly inner RIGHT breast, at far posterior depth, measuring approximately 2.2 cm greatest dimension,  corresponding to the area of clinical concern. No dominant masses, suspicious calcifications or secondary signs of malignancy are identified elsewhere within the RIGHT breast. . Targeted ultrasound is performed, showing an irregular hypoechoic mass in the RIGHT breast at the 12:30 o'clock axis, 8 cm from the nipple, measuring 2.7 x 1.7 x 1.9 cm. The mass abuts and may involve the pectoralis muscle. RIGHT axilla was evaluated with ultrasound showing no enlarged or morphologically abnormal lymph nodes.  Accordingly on 07/03/2018 she proceeded to biopsy of the right breast area in question. 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 Prognostic indicators significant for: estrogen receptor, 95% positive with strong staining intensity and progesterone receptor, 10% positive with weak staining intensity. Proliferation marker Ki67 at 5%. HER2 equivocal (2+) by immunohistochemistry but reportedly negative by fluorescent in situ hybridization.  The patient's subsequent history is as detailed below.   PAST MEDICAL HISTORY: Past Medical History:  Diagnosis Date  . Breast cancer ([place]) 2020  . Cancer ([place])   . Diabetes mellitus without complication ([place])   . Family history of cancer   . Hyperlipidemia   . Hypertension   . Personal history of radiation therapy     PAST SURGICAL HISTORY: Past Surgical History:  Procedure Laterality Date  . BREAST LUMPECTOMY    . BREAST LUMPECTOMY WITH RADIOACTIVE SEED AND SENTINEL LYMPH NODE BIOPSY Right 08/31/2018   Procedure: RIGHT BREAST LUMPECTOMY WITH RADIOACTIVE SEED AND SENTINEL LYMPH NODE BIOPSY;  Surgeon: Stark Klein, MD;  Location: [facility];  Service: General;  Laterality: Right;  . NO PAST SURGERIES    . TUBAL LIGATION    Tubal ligation in 1990 or 1991.    FAMILY HISTORY: Family  History  Problem Relation Age of Onset  . Cancer Mother 22       unknown  cancer, in her lower abdomen  . Cancer Sister 54       unknown cancer in her lower abdomen  . Heart disease Brother   . Cancer Niece        unknown form of cancer  Emily Hendricks's father died from heart complications following a heart surgery about 45 years ago. Patients' mother died from unknown cancer in her 6's. The patient has 6 brothers and 1 sisters. Her sister had cancer of unknown type, but that was in her lower abdomen. Patient denies anyone else in her family having cancer.     GYNECOLOGIC HISTORY:  No LMP recorded. Patient is postmenopausal. 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 She moved here from [place], [place] in 2005. Her husband is partially disabled. At home with them is her mother-in-law, her son, and her daughter-in-law. Nielle's son's name is [employer].  He is a Freight forwarder at [employer].  His wife is a Marine scientist.  The patient's daughter, Michaiah Maiden, lives in [place] with her husband  ADVANCED DIRECTIVES: In the absence of any documentation, Kaula's spouse, is her healthcare power of attorney. The appropriate documentation is provided on 07/12/2018 for her to review, notarize, and return and her earliest convenience.  The patient intends to name her daughter Elma Shands as her healthcare power of attorney.  She also has the daughter's phone number as her primary contact   HEALTH MAINTENANCE: Social History   Tobacco Use  . Smoking status: Never Smoker  . Smokeless tobacco: Never Used  Vaping Use  . Vaping Use: Never used  Substance Use Topics  . Alcohol use: No  . Drug use: No    Colonoscopy:   PAP:   Bone density:   Allergies  Allergen Reactions  . Lisinopril Cough    Current Outpatient Medications  Medication Sig Dispense Refill  .  acetaminophen (TYLENOL) 500 MG tablet Take by mouth every 6 (six) hours as needed. Pt does not know dosage    . anastrozole (ARIMIDEX) 1 MG tablet Take 1 tablet (1 mg total) by mouth daily. Must have office visit for additional refills. 30 tablet 4  . Cholecalciferol (VITAMIN D) 2000 units tablet Take 1 tablet (2,000 Units total) by mouth daily. 30 tablet 5  . ibuprofen (ADVIL,MOTRIN) 600 MG tablet Take 1 tablet (600 mg total) by mouth every 8 (eight) hours as needed for headache. Take with food. 40 tablet 0  . losartan (COZAAR) 50 MG tablet Take 1 tablet (50 mg total) by mouth daily. 90 tablet 1  . metFORMIN (GLUCOPHAGE) 500 MG tablet Take 1 tablet (500 mg total) by mouth 2 (two) times daily with a meal. 60 tablet 2  . simvastatin (ZOCOR) 20 MG tablet TAKE ONE TABLET BY MOUTH DAILY 30 tablet 2  . triamcinolone cream (KENALOG) 0.5 % Apply 1 application topically 2 (two) times daily. 60 g 0   No current facility-administered medications for this visit.    OBJECTIVE:  [place] woman in no acute distress  There were no vitals filed for this visit. Wt Readings from Last 3 Encounters:  09/20/19 136 lb 3.2 oz (61.8 kg)  07/03/19 134 lb 6.4  oz (61 kg)  04/09/19 134 lb 12.8 oz (61.1 kg)   There is no height or weight on file to calculate BMI.    ECOG FS:1 - Symptomatic but completely ambulatory  Sclerae unicteric, EOMs intact Wearing a mask No cervical or supraclavicular adenopathy Lungs no rales or rhonchi Heart regular rate and rhythm Abd soft, nontender, positive bowel sounds MSK no focal spinal tenderness, no upper extremity lymphedema Neuro: nonfocal, well oriented, appropriate affect Breasts:   {Sclerae unicteric, EOMs intact Wearing a mask No cervical or supraclavicular adenopathy Lungs no rales or rhonchi Heart regular rate and rhythm Abd soft, nontender, positive bowel sounds MSK no focal spinal tenderness, no upper extremity lymphedema Neuro: nonfocal, well oriented,  appropriate affect Breasts: The right breast is status post lumpectomy and radiation.  It is slightly hyperpigmented and definitely firmer than the left breast but there is no evidence of local recurrence.  The left breast is benign.  Both axillae are benign.}   LAB RESULTS:  CMP     Component Value Date/Time   NA 143 07/03/2019 0920   NA 142 06/06/2018 1639   K 4.0 07/03/2019 0920   CL 108 07/03/2019 0920   CO2 26 07/03/2019 0920   GLUCOSE 94 07/03/2019 0920   BUN 14 07/03/2019 0920   BUN 15 06/06/2018 1639   CREATININE 0.75 07/03/2019 0920   CREATININE 0.83 07/12/2018 0848   CREATININE 0.78 02/03/2016 1613   CALCIUM 9.1 07/03/2019 0920   PROT 7.1 07/03/2019 0920   PROT 6.5 06/06/2018 1639   ALBUMIN 3.9 07/03/2019 0920   ALBUMIN 4.3 06/06/2018 1639   AST 17 07/03/2019 0920   AST 17 07/12/2018 0848   ALT 13 07/03/2019 0920   ALT 12 07/12/2018 0848   ALKPHOS 92 07/03/2019 0920   BILITOT 0.5 07/03/2019 0920   BILITOT 0.5 07/12/2018 0848   GFRNONAA >60 07/03/2019 0920   GFRNONAA >60 07/12/2018 0848   GFRNONAA 82 02/03/2016 1613   GFRAA >60 07/03/2019 0920   GFRAA >60 07/12/2018 0848   GFRAA >89 02/03/2016 1613    No results found for: TOTALPROTELP, ALBUMINELP, A1GS, A2GS, BETS, BETA2SER, GAMS, MSPIKE, SPEI  No results found for: KPAFRELGTCHN, LAMBDASER, KAPLAMBRATIO  Lab Results  Component Value Date   WBC 3.4 (L) 07/03/2019   NEUTROABS 2.2 07/03/2019   HGB 11.6 (L) 07/03/2019   HCT 37.7 07/03/2019   MCV 85.1 07/03/2019   PLT 213 07/03/2019    No results found for: LABCA2  No components found for: [id number]  No results for input(s): INR in the last 168 hours.  No results found for: LABCA2  No results found for: SKA768  No results found for: TLX726  No results found for: OMB559  No results found for: CA2729  No components found for: HGQUANT  No results found for: CEA1 / No results found for: CEA1   No results found for: AFPTUMOR  No results  found for: CHROMOGRNA  No results found for: HGBA, HGBA2QUANT, HGBFQUANT, HGBSQUAN (Hemoglobinopathy evaluation)   No results found for: LDH  No results found for: IRON, TIBC, IRONPCTSAT (Iron and TIBC)  No results found for: FERRITIN  Urinalysis No results found for: COLORURINE, APPEARANCEUR, LABSPEC, PHURINE, GLUCOSEU, HGBUR, BILIRUBINUR, KETONESUR, PROTEINUR, UROBILINOGEN, NITRITE, LEUKOCYTESUR   STUDIES:  No results found.   ELIGIBLE FOR AVAILABLE RESEARCH PROTOCOL: no   ASSESSMENT: 65 y.o. [place] woman status post right breast biopsy 07/03/2018 for a clinical T2 N0, stage IB invasive ductal carcinoma, grade 2, estrogen  receptor strongly positive, progesterone receptor weakly positive, with no HER-2 amplification and an MIB-1 of 5%.  (1) neoadjuvant anastrozole started 07/12/2018, interrupted during radiation, resumed 11/28/2018  (a) bone density  (2) status post right lumpectomy and sentinel lymph node sampling 08/31/2018 for a pT2 pN0, stage IB invasive ductal carcinoma, grade 2, with close but negative margins  (a) a total of 3 sentinel lymph nodes removed  (3) MammaPrint shows a luminal type low risk tumor, predicting a 97.8% chance of being alive without distant recurrence at 5 years with antiestrogen therapy alone [without chemotherapy]  (4) adjuvant radiation 10/05/2018 - 11/22/2018 Site Technique Total Dose Dose per Fx Completed Fx Beam Energies  Breast: Breast_Rt 3D 50.4/50.4 1.8 28/28 6X  Breast: Breast_Rt_axilla 3D 45/45 1.8 25/25 6X, 10X  Breast: Breast_Rt_Bst 3D 12/12 2 6/6 6X, 10X    (5) Genetic testing on 07/12/2018 through the Common Hereditary Gene Panel offered by [facility] found no pathogenic mutations in APC, ATM, AXIN2, BARD1, BMPR1A, BRCA1, BRCA2, BRIP1, CDH1, CDK4, CDKN2A (p14ARF), CDKN2A (p16INK4a), CHEK2, CTNNA1, DICER1, EPCAM (Deletion/duplication testing only), GREM1 (promoter region deletion/duplication testing only), KIT, MEN1,  MLH1, MSH2, MSH3, MSH6, MUTYH, NBN, NF1, NHTL1, PALB2, PDGFRA, PMS2, POLD1, POLE, PTEN, RAD50, RAD51C, RAD51D, RNF43, SDHB, SDHC, SDHD, SMAD4, SMARCA4. STK11, TP53, TSC1, TSC2, and VHL.  The following genes were evaluated for sequence changes only: SDHA and HOXB13 c.251G>A variant only.   PLAN:  Quintina is now just about a year out from definitive surgery for her breast cancer with no evidence of disease recurrence.  This is very favorable.  She continues on anastrozole with good tolerance and the plan is to continue that a minimum of 5 years.  She is due for mammography and a bone density.  Those orders are in.  I alerted her daughter to watch out for that phone call so they can get it done this month.  I also recommended they received either the [place] or the majority of vaccine.  This needs to be done after the mammogram so there is no confusion regarding adenopathy  She will see [place] again in 6 months and then again in a year; at that point we will start seeing her on a yearly basis  Total encounter time 30 minutes.*   Magrinat, Virgie Dad, MD  06/30/20 8:31 PM Medical Oncology and Hematology [facility] [place], [place] [address] Tel. [phone]    Fax. [phone]   I, [place]burn Mylar, am acting as scribe for Dr. Virgie Dad. Magrinat.  I, Lurline Del MD, have reviewed the above documentation for accuracy and completeness, and I agree with the above.   *Total Encounter Time as defined by the Centers for Medicare and Medicaid Services includes, in addition to the face-to-face time of a patient visit (documented in the note above) non-face-to-face time: obtaining and reviewing outside history, ordering and reviewing medications, tests or procedures, care coordination (communications with other health care professionals or caregivers) and documentation in the medical record.

## 2020-07-01 ENCOUNTER — Inpatient Hospital Stay: Payer: Medicaid Other | Admitting: Oncology

## 2020-07-01 ENCOUNTER — Inpatient Hospital Stay: Payer: Medicaid Other | Attending: Nurse Practitioner

## 2020-07-15 ENCOUNTER — Telehealth: Payer: Self-pay | Admitting: Oncology

## 2020-07-15 NOTE — Telephone Encounter (Signed)
Scheduled appt per 4/25sch msg. Pt aware.  

## 2020-07-31 ENCOUNTER — Ambulatory Visit: Payer: Medicaid Other | Admitting: Nurse Practitioner

## 2020-08-09 NOTE — Progress Notes (Signed)
[facility]  [phone] [phone]    ID: Emily Hendricks DOB: 11-13-1955  MR#: [id number]  DGL#:[id number]  Patient Care Team: Gildardo Pounds, NP as PCP - General (Nurse Practitioner) Mauro Kaufmann, RN as Oncology Nurse Navigator Rockwell Germany, RN as Oncology Nurse Navigator Stark Klein, MD as Consulting Physician (General Surgery) Emily Hendricks, Emily Dad, MD as Consulting Physician (Oncology) Gery Pray, MD as Consulting Physician (Radiation Oncology) OTHER MD:   CHIEF COMPLAINT: Estrogen receptor positive breast cancer  CURRENT TREATMENT: Anastrozole   INTERVAL HISTORY: Emily Hendricks returns today for follow-up of her strogen receptor positive breast cancer accompanied by an interpreter.  The patient continues on anastrozole. She is having no side effects from it that she is aware of, specifically hot flashes are not a major issue.  Since her last visit, she underwent bilateral diagnostic mammography with tomography at [facility] on 12/28/2019 showing: breast density category C; no evidence of malignancy in either breast.  She also underwent bone density screening the same day showing a T-score of -3.1, which is considered osteoporotic.   REVIEW OF SYSTEMS: Emily Hendricks is not taking vitamin D at present--she ran out and did not have the medication refilled.  She has not had any bleeding that she is aware of and particularly no bright red blood per rectum or melena.  She is extremely busy because her husband has had a stroke and she also has to take care of her 35 year old mother-in-law.  On the plus side she has a 70-monthold grandchild at home which makes her very happy.  She is feeling a little bit more fatigued than usual.  A detailed review of systems today was otherwise stable   COVID 19 VACCINATION STATUS:    HISTORY OF CURRENT ILLNESS: From the original intake note:  SOmolara Hendricks a right breast lump saometime in  March 2020; [facility].  She [facility] bilateral diagnostic [facility] with [facility] showing: Breast Density Category C. No dominant masses, suspicious calcifications or secondary signs of malignancy are identified within the LEFT breast.   There is a spiculated mass within the upper slightly inner RIGHT breast, at far posterior depth, measuring approximately 2.2 cm greatest dimension, corresponding to the area of clinical concern. No dominant masses, suspicious calcifications or secondary signs of malignancy are identified elsewhere within the RIGHT breast. . Targeted ultrasound is performed, showing an irregular hypoechoic mass in the RIGHT breast at the 12:30 o'clock axis, 8 cm from the nipple, measuring 2.7 x 1.7 x 1.9 cm. The mass abuts and may involve the pectoralis muscle. RIGHT axilla was evaluated with ultrasound showing no enlarged or morphologically abnormal lymph nodes.  Accordingly on 07/03/2018 she proceeded to biopsy of the right breast area in question. 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 Prognostic indicators significant for: estrogen receptor, 95% positive with strong staining intensity and progesterone receptor, 10% positive with weak staining intensity. Proliferation marker Ki67 at 5%. HER2 equivocal (2+) by immunohistochemistry but reportedly negative by fluorescent in situ hybridization.  The patient's subsequent history is as detailed below.   PAST MEDICAL HISTORY: Past Medical History:  Diagnosis Date  . Breast cancer ([facility] 2020  . Cancer ([facility]   . Diabetes mellitus without complication ([facility]   . Family history of cancer   . Hyperlipidemia   . Hypertension   .  Personal history of radiation therapy     PAST SURGICAL HISTORY: Past Surgical History:  Procedure  Laterality Date  . BREAST LUMPECTOMY    . BREAST LUMPECTOMY WITH RADIOACTIVE SEED AND SENTINEL LYMPH NODE BIOPSY Right 08/31/2018   Procedure: RIGHT BREAST LUMPECTOMY WITH RADIOACTIVE SEED AND SENTINEL LYMPH NODE BIOPSY;  Surgeon: Stark Klein, MD;  Location: [facility];  Service: General;  Laterality: Right;  . NO PAST SURGERIES    . TUBAL LIGATION    Tubal ligation in 1990 or 1991.    FAMILY HISTORY: Family History  Problem Relation Age of Onset  . Cancer Mother 15       unknown cancer, in her lower abdomen  . Cancer Sister 28       unknown cancer in her lower abdomen  . Heart disease Brother   . Cancer Niece        unknown form of cancer  [name] father died from heart complications following a heart surgery about 45 years ago. Patients' mother died from unknown cancer in her 63's. The patient has 6 brothers and 1 sisters. Her sister had cancer of unknown type, but that was in her lower abdomen. Patient denies anyone else in her family having cancer.     GYNECOLOGIC HISTORY:  No LMP recorded. Patient is postmenopausal. 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 She moved here from [place], [place] in 2005. Her husband is partially disabled. At home with them is her 25 mother-in-law, her son, and her daughter-in-law. [name]'s son's name is Emily Hendricks.  He is a Freight forwarder at [employer].  His wife is a Marine scientist.  The patient's daughter, Emily Hendricks, lives in [place] with her husband  ADVANCED DIRECTIVES: In the absence of any documentation, Emily Hendricks's spouse, is her healthcare power of attorney. The appropriate documentation is provided on 07/12/2018 for her to review, notarize, and return and her earliest convenience.  The patient intends to name her daughter  Emily Hendricks as her healthcare power of attorney.  She also has the daughter's phone number as her primary contact   HEALTH MAINTENANCE: Social History   Tobacco Use  . Smoking status: Never Smoker  . Smokeless tobacco: Never Used  Vaping Use  . Vaping Use: Never used  Substance Use Topics  . Alcohol use: No  . Drug use: No    Colonoscopy:   PAP:   Bone density:   Allergies  Allergen Reactions  . Lisinopril Cough    Current Outpatient Medications  Medication Sig Dispense Refill  . ferrous sulfate 325 (65 FE) MG EC tablet Take 1 tablet (325 mg total) by mouth daily with breakfast. 90 tablet 4  . acetaminophen (TYLENOL) 500 MG tablet Take by mouth every 6 (six) hours as needed. Pt does not know dosage    . anastrozole (ARIMIDEX) 1 MG tablet Take 1 tablet (1 mg total) by mouth daily. Must have office visit for additional refills. 90 tablet 4  . Cholecalciferol (VITAMIN D) 50 MCG (2000 UT) tablet Take 1 tablet (2,000 Units total) by mouth daily. 90 tablet 4  . ibuprofen (ADVIL,MOTRIN) 600 MG tablet Take 1 tablet (600 mg total) by mouth every 8 (eight) hours as needed for headache. Take with food. 40 tablet 0  . losartan (  COZAAR) 50 MG tablet Take 1 tablet (50 mg total) by mouth daily. 90 tablet 1  . metFORMIN (GLUCOPHAGE) 500 MG tablet Take 1 tablet (500 mg total) by mouth 2 (two) times daily with a meal. 60 tablet 2  . simvastatin (ZOCOR) 20 MG tablet TAKE ONE TABLET BY MOUTH DAILY 30 tablet 2  . triamcinolone cream (KENALOG) 0.5 % Apply 1 application topically 2 (two) times daily. 60 g 0   No current facility-administered medications for this visit.    OBJECTIVE:  [place] woman who appears stated age  19:   08/11/20 1450  BP: (!) 161/73  Pulse: 85  Resp: 18  Temp: 98.1 F (36.7 C)  SpO2: 100%   Wt Readings from Last 3 Encounters:  08/11/20 136 lb 14.4 oz (62.1 kg)  09/20/19 136 lb 3.2 oz (61.8 kg)  07/03/19 134 lb 6.4 oz (61 kg)   Body mass index is 25.04  kg/m.    ECOG FS:1 - Symptomatic but completely ambulatory  Sclerae unicteric, EOMs intact Wearing a mask No cervical or supraclavicular adenopathy Lungs no rales or rhonchi Heart regular rate and rhythm Abd soft, nontender, positive bowel sounds MSK no focal spinal tenderness, no upper extremity lymphedema Neuro: nonfocal, well oriented, appropriate affect Breasts: The right breast has undergone lumpectomy and radiation.  There is no evidence of local recurrence.  The left breast is benign.  Both axillae are benign.   LAB RESULTS:  CMP     Component Value Date/Time   NA 143 07/03/2019 0920   NA 142 06/06/2018 1639   K 4.0 07/03/2019 0920   CL 108 07/03/2019 0920   CO2 26 07/03/2019 0920   GLUCOSE 94 07/03/2019 0920   BUN 14 07/03/2019 0920   BUN 15 06/06/2018 1639   CREATININE 0.75 07/03/2019 0920   CREATININE 0.83 07/12/2018 0848   CREATININE 0.78 02/03/2016 1613   CALCIUM 9.1 07/03/2019 0920   PROT 7.1 07/03/2019 0920   PROT 6.5 06/06/2018 1639   ALBUMIN 3.9 07/03/2019 0920   ALBUMIN 4.3 06/06/2018 1639   AST 17 07/03/2019 0920   AST 17 07/12/2018 0848   ALT 13 07/03/2019 0920   ALT 12 07/12/2018 0848   ALKPHOS 92 07/03/2019 0920   BILITOT 0.5 07/03/2019 0920   BILITOT 0.5 07/12/2018 0848   GFRNONAA >60 07/03/2019 0920   GFRNONAA >60 07/12/2018 0848   GFRNONAA 82 02/03/2016 1613   GFRAA >60 07/03/2019 0920   GFRAA >60 07/12/2018 0848   GFRAA >89 02/03/2016 1613    No results found for: TOTALPROTELP, ALBUMINELP, A1GS, A2GS, BETS, BETA2SER, GAMS, MSPIKE, SPEI  No results found for: KPAFRELGTCHN, LAMBDASER, KAPLAMBRATIO  Lab Results  Component Value Date   WBC 4.3 08/11/2020   NEUTROABS 2.6 08/11/2020   HGB 10.7 (L) 08/11/2020   HCT 35.1 (L) 08/11/2020   MCV 79.6 (L) 08/11/2020   PLT 223 08/11/2020    No results found for: LABCA2  No components found for: [id number]  No results for input(s): INR in the last 168 hours.  No results found for:  LABCA2  No results found for: LPF790  No results found for: WIO973  No results found for: ZHG992  No results found for: CA2729  No components found for: HGQUANT  No results found for: CEA1 / No results found for: CEA1   No results found for: AFPTUMOR  No results found for: CHROMOGRNA  No results found for: HGBA, HGBA2QUANT, HGBFQUANT, HGBSQUAN (Hemoglobinopathy evaluation)   No results found for: LDH  No results found for: IRON, TIBC, IRONPCTSAT (Iron and TIBC)  No results found for: FERRITIN  Urinalysis No results found for: COLORURINE, APPEARANCEUR, LABSPEC, PHURINE, GLUCOSEU, HGBUR, BILIRUBINUR, KETONESUR, PROTEINUR, UROBILINOGEN, NITRITE, LEUKOCYTESUR   STUDIES:  No results found.   ELIGIBLE FOR AVAILABLE RESEARCH PROTOCOL: no   ASSESSMENT: 65 y.o. [place] woman status post right breast biopsy 07/03/2018 for a clinical T2 N0, stage IB invasive ductal carcinoma, grade 2, estrogen receptor strongly positive, progesterone receptor weakly positive, with no HER-2 amplification and an MIB-1 of 5%.  (1) neoadjuvant anastrozole started 07/12/2018, interrupted during radiation, resumed 11/28/2018  (a) bone density October 2021 shows a T score of -3.1 (osteoporosis).  (2) status post right lumpectomy and sentinel lymph node sampling 08/31/2018 for a pT2 pN0, stage IB invasive ductal carcinoma, grade 2, with close but negative margins  (a) a total of 3 sentinel lymph nodes removed  (3) MammaPrint shows a luminal type low risk tumor, predicting a 97.8% chance of being alive without distant recurrence at 5 years with antiestrogen therapy alone [without chemotherapy]  (4) adjuvant radiation 10/05/2018 - 11/22/2018 Site Technique Total Dose Dose per Fx Completed Fx Beam Energies  Breast: Breast_Rt 3D 50.4/50.4 1.8 28/28 6X  Breast: Breast_Rt_axilla 3D 45/45 1.8 25/25 6X, 10X  Breast: Breast_Rt_Bst 3D 12/12 2 6/6 6X, 10X    (5) Genetic testing on  07/12/2018 through the Common Hereditary Gene Panel offered by [facility] found no pathogenic mutations in APC, ATM, AXIN2, BARD1, BMPR1A, BRCA1, BRCA2, BRIP1, CDH1, CDK4, CDKN2A (p14ARF), CDKN2A (p16INK4a), CHEK2, CTNNA1, DICER1, EPCAM (Deletion/duplication testing only), GREM1 (promoter region deletion/duplication testing only), KIT, MEN1, MLH1, MSH2, MSH3, MSH6, MUTYH, NBN, NF1, NHTL1, PALB2, PDGFRA, PMS2, POLD1, POLE, PTEN, RAD50, RAD51C, RAD51D, RNF43, SDHB, SDHC, SDHD, SMAD4, SMARCA4. STK11, TP53, TSC1, TSC2, and VHL.  The following genes were evaluated for sequence changes only: SDHA and HOXB13 c.251G>A variant only.   PLAN:  Nakari is now just about 2 years out from definitive surgery for her breast cancer with no evidence of disease recurrence.  This is very favorable.  She is tolerating anastrozole well and the plan will be to continue that a total of 5 years.  I have refilled her vitamin D and explained that she really needs to take it every day if she wants her bones to get a little bit stronger.  She has now microcytic anemia.  She is likely iron deficient.  I am obtaining some iron studies today to document that.  She did have colonoscopy 4 years ago and she might warrant repeat GI work-up for the anemia assuming we do document the iron deficiency.  If so she was offered intravenous iron but she strongly prefers medication by mouth and she will start ferrous sulfate daily as soon as she obtains  Otherwise she will return to see [place] in 3 months.  We will repeat the iron studies at that time.  She knows to call for any other issue that may develop before then.  Total encounter time 35 minutes.*  ADDENDUM: labs confirm iron deficiency. Discussed with patient's daughter. GI referral placed. Orders for venofer entered.   Adelai Achey, Emily Dad, MD  08/11/20 3:07 PM Medical Oncology and Hematology [facility] [place], [place] [address] Tel. [phone]    Fax.  [phone]   I, Wilburn Mylar, am acting as scribe for Dr. Virgie Hendricks. Katisha Shimizu.  Lindie Spruce MD, have reviewed the above documentation for accuracy and completeness, and I  agree with the above.   *Total Encounter Time as defined by the Centers for Medicare and Medicaid Services includes, in addition to the face-to-face time of a patient visit (documented in the note above) non-face-to-face time: obtaining and reviewing outside history, ordering and reviewing medications, tests or procedures, care coordination (Hendricks with other health care professionals or caregivers) and documentation in the medical record.

## 2020-08-11 ENCOUNTER — Inpatient Hospital Stay: Payer: Medicaid Other | Attending: Nurse Practitioner | Admitting: Oncology

## 2020-08-11 ENCOUNTER — Inpatient Hospital Stay: Payer: Medicaid Other

## 2020-08-11 ENCOUNTER — Other Ambulatory Visit: Payer: Self-pay

## 2020-08-11 VITALS — BP 161/73 | HR 85 | Temp 98.1°F | Resp 18 | Wt 136.9 lb

## 2020-08-11 DIAGNOSIS — Z923 Personal history of irradiation: Secondary | ICD-10-CM | POA: Insufficient documentation

## 2020-08-11 DIAGNOSIS — D509 Iron deficiency anemia, unspecified: Secondary | ICD-10-CM | POA: Diagnosis not present

## 2020-08-11 DIAGNOSIS — Z170 Estrogen receptor positive status [ER+]: Secondary | ICD-10-CM

## 2020-08-11 DIAGNOSIS — C50211 Malignant neoplasm of upper-inner quadrant of right female breast: Secondary | ICD-10-CM

## 2020-08-11 DIAGNOSIS — Z79899 Other long term (current) drug therapy: Secondary | ICD-10-CM | POA: Insufficient documentation

## 2020-08-11 DIAGNOSIS — Z7984 Long term (current) use of oral hypoglycemic drugs: Secondary | ICD-10-CM | POA: Insufficient documentation

## 2020-08-11 DIAGNOSIS — I10 Essential (primary) hypertension: Secondary | ICD-10-CM | POA: Diagnosis not present

## 2020-08-11 DIAGNOSIS — Z79811 Long term (current) use of aromatase inhibitors: Secondary | ICD-10-CM | POA: Diagnosis not present

## 2020-08-11 DIAGNOSIS — E785 Hyperlipidemia, unspecified: Secondary | ICD-10-CM | POA: Insufficient documentation

## 2020-08-11 DIAGNOSIS — Z809 Family history of malignant neoplasm, unspecified: Secondary | ICD-10-CM | POA: Diagnosis not present

## 2020-08-11 DIAGNOSIS — E119 Type 2 diabetes mellitus without complications: Secondary | ICD-10-CM | POA: Diagnosis not present

## 2020-08-11 LAB — COMPREHENSIVE METABOLIC PANEL
ALT: 9 U/L (ref 0–44)
AST: 15 U/L (ref 15–41)
Albumin: 3.7 g/dL (ref 3.5–5.0)
Alkaline Phosphatase: 112 U/L (ref 38–126)
Anion gap: 8 (ref 5–15)
BUN: 17 mg/dL (ref 8–23)
CO2: 26 mmol/L (ref 22–32)
Calcium: 9.1 mg/dL (ref 8.9–10.3)
Chloride: 109 mmol/L (ref 98–111)
Creatinine, Ser: 0.72 mg/dL (ref 0.44–1.00)
GFR, Estimated: >60 mL/min (ref >60)
Glucose, Bld: 127 mg/dL — ABNORMAL HIGH (ref 70–99)
Potassium: 4.1 mmol/L (ref 3.5–5.1)
Sodium: 143 mmol/L (ref 135–145)
Total Bilirubin: 0.3 mg/dL (ref 0.3–1.2)
Total Protein: 7.1 g/dL (ref 6.5–8.1)

## 2020-08-11 LAB — CBC WITH DIFFERENTIAL/PLATELET
Abs Immature Granulocytes: 0.02 10*3/uL (ref 0.00–0.07)
Basophils Absolute: 0 10*3/uL (ref 0.0–0.1)
Basophils Relative: 1 %
Eosinophils Absolute: 0.2 10*3/uL (ref 0.0–0.5)
Eosinophils Relative: 4 %
HCT: 35.1 % — ABNORMAL LOW (ref 36.0–46.0)
Hemoglobin: 10.7 g/dL — ABNORMAL LOW (ref 12.0–15.0)
Immature Granulocytes: 1 %
Lymphocytes Relative: 27 %
Lymphs Abs: 1.1 10*3/uL (ref 0.7–4.0)
MCH: 24.3 pg — ABNORMAL LOW (ref 26.0–34.0)
MCHC: 30.5 g/dL (ref 30.0–36.0)
MCV: 79.6 fL — ABNORMAL LOW (ref 80.0–100.0)
Monocytes Absolute: 0.3 10*3/uL (ref 0.1–1.0)
Monocytes Relative: 8 %
Neutro Abs: 2.6 10*3/uL (ref 1.7–7.7)
Neutrophils Relative %: 59 %
Platelets: 223 10*3/uL (ref 150–400)
RBC: 4.41 MIL/uL (ref 3.87–5.11)
RDW: 14.7 % (ref 11.5–15.5)
WBC: 4.3 10*3/uL (ref 4.0–10.5)
nRBC: 0 % (ref 0.0–0.2)

## 2020-08-11 LAB — HEMOGLOBIN A1C
Hgb A1c MFr Bld: 6.8 % — ABNORMAL HIGH (ref 4.8–5.6)
Mean Plasma Glucose: 148.46 mg/dL

## 2020-08-11 LAB — RETICULOCYTES
Immature Retic Fract: 14.9 % (ref 2.3–15.9)
RBC.: 4.41 MIL/uL (ref 3.87–5.11)
Retic Count, Absolute: 43.7 10*3/uL (ref 19.0–186.0)
Retic Ct Pct: 1 % (ref 0.4–3.1)

## 2020-08-11 MED ORDER — ANASTROZOLE 1 MG PO TABS: 1.0000 mg | ORAL_TABLET | Freq: Every day | ORAL | 4 refills | Status: DC

## 2020-08-11 MED ORDER — VITAMIN D 50 MCG (2000 UT) PO TABS: 2000.0000 [IU] | ORAL_TABLET | Freq: Every day | ORAL | 4 refills | Status: DC

## 2020-08-11 MED ORDER — FERROUS SULFATE 325 (65 FE) MG PO TBEC: 325.0000 mg | DELAYED_RELEASE_TABLET | Freq: Every day | ORAL | 4 refills | Status: DC

## 2020-08-12 ENCOUNTER — Encounter: Payer: Self-pay | Admitting: Oncology

## 2020-08-12 ENCOUNTER — Other Ambulatory Visit: Payer: Self-pay | Admitting: Oncology

## 2020-08-12 DIAGNOSIS — D649 Anemia, unspecified: Secondary | ICD-10-CM | POA: Insufficient documentation

## 2020-08-12 DIAGNOSIS — C50211 Malignant neoplasm of upper-inner quadrant of right female breast: Secondary | ICD-10-CM

## 2020-08-12 DIAGNOSIS — D508 Other iron deficiency anemias: Secondary | ICD-10-CM

## 2020-08-12 DIAGNOSIS — D509 Iron deficiency anemia, unspecified: Secondary | ICD-10-CM | POA: Insufficient documentation

## 2020-08-12 DIAGNOSIS — Z170 Estrogen receptor positive status [ER+]: Secondary | ICD-10-CM

## 2020-08-12 LAB — IRON AND TIBC
Iron: 28 ug/dL — ABNORMAL LOW (ref 41–142)
Saturation Ratios: 6 % — ABNORMAL LOW (ref 21–57)
TIBC: 503 ug/dL — ABNORMAL HIGH (ref 236–444)
UIBC: 474 ug/dL — ABNORMAL HIGH (ref 120–384)

## 2020-08-12 LAB — FERRITIN: Ferritin: 4 ng/mL — ABNORMAL LOW (ref 11–307)

## 2020-08-12 NOTE — Progress Notes (Signed)
I called the patient's home and spoke to her daughter explaining that the patient has iron deficiency.  This can be corrected with oral iron but this can cause many side effects and also may interfere with GI evaluation.  I suggested we start Venofer 08/29/2020.  I have also placed a GI referral for further evaluation

## 2020-08-20 ENCOUNTER — Telehealth: Payer: Self-pay | Admitting: Oncology

## 2020-08-20 NOTE — Telephone Encounter (Signed)
Scheduled appts per 5/24 sch msg. Called pt using interpreter line. No answer. Left msg with appts date and times.

## 2020-08-25 ENCOUNTER — Telehealth: Payer: Self-pay | Admitting: Nurse Practitioner

## 2020-08-25 NOTE — Telephone Encounter (Signed)
   Emily Hendricks DOB: [birth year] MRN: [id number]   RIDER WAIVER AND RELEASE OF LIABILITY  For purposes of improving physical access to our facilities, [facility] is pleased to partner with third parties to provide [facility] patients or other authorized individuals the option of convenient, on-demand ground transportation services (the [place]") through use of the technology service that enables users to request on-demand ground transportation from independent third-party providers.  By opting to use and accept these [employer], I, the undersigned, hereby agree on behalf of myself, and on behalf of any minor child using the [employer] for whom I am the parent or legal guardian, as follows:  1. Government social research officer provided to me are provided by independent third-party transportation providers who are not [employer] or employees and who are unaffiliated with [employer]. 2. [facility] is neither a transportation carrier nor a common or public carrier. 3. [facility] has no control over the quality or safety of the transportation that occurs as a result of the [employer]. 4. [facility] cannot guarantee that any third-party transportation provider will complete any arranged transportation service. 5. [facility] makes no representation, warranty, or guarantee regarding the reliability, timeliness, quality, safety, suitability, or availability of any of the Transport Services or that they will be error free. 6. I fully understand that traveling by vehicle involves risks and dangers of serious bodily injury, including permanent disability, paralysis, and death. I agree, on behalf of myself and on behalf of any minor child using the Transport Services for whom I am the parent or legal guardian, that the entire risk arising out of my use of the [employer] remains solely with me, to the maximum extent permitted under applicable law. 7. The [employer] are provided "as is" and "as available." [facility] disclaims all representations and warranties, express, implied or statutory, not expressly set out in these terms, including the implied warranties of merchantability and fitness for a particular purpose. 8. I hereby waive and release [facility], its agents, employees, officers, directors, representatives, insurers, attorneys, assigns, successors, subsidiaries, and affiliates from any and all past, present, or future claims, demands, liabilities, actions, causes of action, or suits of any kind directly or indirectly arising from acceptance and use of the [employer]. 9. I further waive and release [facility] and its affiliates from all present and future liability and responsibility for any injury or death to persons or damages to property caused by or related to the use of the [employer]. 10. I have read this Waiver and Release of Liability, and I understand the terms used in it and their legal significance. This Waiver is freely and voluntarily given with the understanding that my right (as well as the right of any minor child for whom I am the parent or legal guardian using the [employer]) to legal recourse against [facility] in connection with the [employer] is knowingly surrendered in return for use of these services.   I attest that I read the consent document to Emily Hendricks, gave Ms. Friebel the opportunity to ask questions and answered the questions asked (if any). I affirm that Emily Hendricks then provided consent for she's participation in this program.     Legrand Pitts , read to patient son who consented on her behalf

## 2020-08-26 ENCOUNTER — Ambulatory Visit: Payer: Medicaid Other | Admitting: Nurse Practitioner

## 2020-08-29 ENCOUNTER — Inpatient Hospital Stay: Payer: Medicaid Other

## 2020-08-29 ENCOUNTER — Encounter: Payer: Self-pay | Admitting: Adult Health

## 2020-08-29 ENCOUNTER — Other Ambulatory Visit: Payer: Self-pay

## 2020-08-29 ENCOUNTER — Inpatient Hospital Stay: Payer: Medicaid Other | Attending: Nurse Practitioner | Admitting: Adult Health

## 2020-08-29 VITALS — BP 162/91 | HR 82 | Resp 18

## 2020-08-29 VITALS — BP 167/87 | HR 77 | Temp 97.6°F | Resp 18 | Ht 62.0 in | Wt 136.3 lb

## 2020-08-29 DIAGNOSIS — Z170 Estrogen receptor positive status [ER+]: Secondary | ICD-10-CM | POA: Diagnosis not present

## 2020-08-29 DIAGNOSIS — Z809 Family history of malignant neoplasm, unspecified: Secondary | ICD-10-CM | POA: Diagnosis not present

## 2020-08-29 DIAGNOSIS — I10 Essential (primary) hypertension: Secondary | ICD-10-CM | POA: Diagnosis not present

## 2020-08-29 DIAGNOSIS — C50211 Malignant neoplasm of upper-inner quadrant of right female breast: Secondary | ICD-10-CM | POA: Diagnosis present

## 2020-08-29 DIAGNOSIS — E785 Hyperlipidemia, unspecified: Secondary | ICD-10-CM | POA: Diagnosis not present

## 2020-08-29 DIAGNOSIS — M810 Age-related osteoporosis without current pathological fracture: Secondary | ICD-10-CM | POA: Diagnosis not present

## 2020-08-29 DIAGNOSIS — D509 Iron deficiency anemia, unspecified: Secondary | ICD-10-CM | POA: Diagnosis not present

## 2020-08-29 DIAGNOSIS — Z79811 Long term (current) use of aromatase inhibitors: Secondary | ICD-10-CM | POA: Insufficient documentation

## 2020-08-29 DIAGNOSIS — E119 Type 2 diabetes mellitus without complications: Secondary | ICD-10-CM | POA: Diagnosis not present

## 2020-08-29 MED ORDER — SODIUM CHLORIDE 0.9 % IV SOLN
200.0000 mg | Freq: Once | INTRAVENOUS | Status: AC
  Administered 2020-08-29: 200 mg via INTRAVENOUS
  Filled 2020-08-29: qty 200

## 2020-08-29 MED ORDER — SODIUM CHLORIDE 0.9 % IV SOLN
INTRAVENOUS | Status: DC
  Filled 2020-08-29: qty 250

## 2020-08-29 NOTE — Progress Notes (Signed)
[facility]  Telephone:([phone]) [phone] Fax:(336) [phone]    ID: Emily Hendricks DOB: Aug 29, 1955  MR#: [id number]  IBB#:[id number]  Patient Care Team: Emily Pounds, NP as PCP - General (Nurse Practitioner) Emily Kaufmann, RN as Oncology Nurse Navigator Emily Germany, RN as Oncology Nurse Navigator Emily Klein, MD as Consulting Physician (General Surgery) Hendricks, Emily Dad, MD as Consulting Physician (Oncology) Emily Pray, MD as Consulting Physician (Radiation Oncology) OTHER MD:   CHIEF COMPLAINT: Estrogen receptor positive breast cancer  CURRENT TREATMENT: Anastrozole   INTERVAL HISTORY: Emily Hendricks returns today for follow-up of her estrogen receptor positive breast cancer accompanied by an interpreter.  The patient continues on anastrozole. .  she underwent bilateral diagnostic mammography with tomography at [facility] on 12/28/2019 showing: breast density category C; no evidence of malignancy in either breast.  She underwent bone density testing on 12/28/2019 as well and it demonstrated osteoporosis with t score of -3.1 in the L1-L4 spine.   She is here to receive Venofer today for her iron deficiency.  She will receive this weekly x 5 weeks.   REVIEW OF SYSTEMS: Emily Hendricks denies any blood in her stool, black tarry stool, or vaginal bleeding.  Her most recent colonoscopy was completed 4 years ago.  She has not yet been scheduled to see GI.  She denies any new issues and a detailed ROS was otherwise non contributory today.     COVID 19 VACCINATION STATUS:    HISTORY OF CURRENT ILLNESS: From the original intake note:  Emily Hendricks had a right breast lump saometime in March 2020; she went to [facility] to be evaluated.  She underwent bilateral diagnostic mammography with tomography and right breast ultrasonography at [facility] on 06/29/2018 showing: Breast Density Category C. No dominant masses, suspicious  calcifications or secondary signs of malignancy are identified within the LEFT breast.   There is a spiculated mass within the upper slightly inner RIGHT breast, at far posterior depth, measuring approximately 2.2 cm greatest dimension, corresponding to the area of clinical concern. No dominant masses, suspicious calcifications or secondary signs of malignancy are identified elsewhere within the RIGHT breast. . Targeted ultrasound is performed, showing an irregular hypoechoic mass in the RIGHT breast at the 12:30 o'clock axis, 8 cm from the nipple, measuring 2.7 x 1.7 x 1.9 cm. The mass abuts and may involve the pectoralis muscle. RIGHT axilla was evaluated with ultrasound showing no enlarged or morphologically abnormal lymph nodes.  Accordingly on 07/03/2018 she proceeded to biopsy of the right breast area in question. 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 Prognostic indicators significant for: estrogen receptor, 95% positive with strong staining intensity and progesterone receptor, 10% positive with weak staining intensity. Proliferation marker Ki67 at 5%. HER2 equivocal (2+) by immunohistochemistry but reportedly negative by fluorescent in situ hybridization.  The patient's subsequent history is as detailed below.   PAST MEDICAL HISTORY: Past Medical History:  Diagnosis Date   Breast cancer ([place]) 2020   Cancer ([place])    Diabetes mellitus without complication ([place])    Family history of cancer    Hyperlipidemia    Hypertension    Personal history of radiation therapy     PAST SURGICAL HISTORY: Past Surgical History:  Procedure Laterality Date   BREAST LUMPECTOMY     BREAST LUMPECTOMY WITH RADIOACTIVE SEED AND SENTINEL LYMPH NODE BIOPSY Right 08/31/2018   Procedure: RIGHT BREAST LUMPECTOMY WITH RADIOACTIVE  SEED AND SENTINEL LYMPH NODE BIOPSY;  Surgeon: Emily Klein, MD;  Location: [facility];  Service: General;  Laterality: Right;   NO PAST SURGERIES     TUBAL LIGATION    Tubal ligation in 1990 or 1991.    FAMILY HISTORY: Family History  Problem Relation Age of Onset   Cancer Mother 69       unknown cancer, in her lower abdomen   Cancer Sister 22       unknown cancer in her lower abdomen   Heart disease Brother    Cancer Niece        unknown form of cancer  Emily Hendricks's father died from heart complications following a heart surgery about 45 years ago. Patients' mother died from unknown cancer in her 28's. The patient has 6 brothers and 1 sisters. Her sister had cancer of unknown type, but that was in her lower abdomen. Patient denies anyone else in her family having cancer.     GYNECOLOGIC HISTORY:  No LMP recorded. Patient is postmenopausal. 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 She moved here from [place], [place] in 2005. Her husband is partially disabled. At home with them is her 84 mother-in-law, her son, and her daughter-in-law. Emily Hendricks's son's name is Emily Hendricks.  He is a Freight forwarder at [employer].  His wife is a Marine scientist.  The patient's daughter, Emily Hendricks, lives in [place] with her husband  ADVANCED DIRECTIVES: In the absence of any documentation, [name]'s spouse, is her healthcare power of attorney. The appropriate documentation is provided on 07/12/2018 for her to review, notarize, and return and her earliest convenience.  The patient intends to name her daughter Emily Hendricks as her healthcare power of attorney.  She also has the daughter's phone number as her primary contact   HEALTH MAINTENANCE: Social History   Tobacco Use   Smoking status: Never   Smokeless tobacco: Never  Vaping Use   Vaping Use: Never used  Substance Use Topics    Alcohol use: No   Drug use: No    Colonoscopy:   PAP:   Bone density:   Allergies  Allergen Reactions   Lisinopril Cough    Current Outpatient Medications  Medication Sig Dispense Refill   acetaminophen (TYLENOL) 500 MG tablet Take by mouth every 6 (six) hours as needed. Pt does not know dosage     anastrozole (ARIMIDEX) 1 MG tablet Take 1 tablet (1 mg total) by mouth daily. Must have office visit for additional refills. 90 tablet 4   Cholecalciferol (VITAMIN D) 50 MCG (2000 UT) tablet Take 1 tablet (2,000 Units total) by mouth daily. 90 tablet 4   ferrous sulfate 325 (65 FE) MG EC tablet Take 1 tablet (325 mg total) by mouth daily with breakfast. 90 tablet 4   ibuprofen (ADVIL,MOTRIN) 600 MG tablet Take 1 tablet (600 mg total) by mouth every 8 (eight) hours as needed for headache. Take with food. 40 tablet 0   losartan (COZAAR) 50 MG tablet Take 1 tablet (50 mg total) by mouth daily. 90 tablet 1   metFORMIN (GLUCOPHAGE) 500 MG tablet Take 1 tablet (500 mg total) by mouth 2 (two) times daily with a meal. 60 tablet 2   simvastatin (ZOCOR) 20 MG tablet TAKE  ONE TABLET BY MOUTH DAILY 30 tablet 2   triamcinolone cream (KENALOG) 0.5 % Apply 1 application topically 2 (two) times daily. 60 g 0   No current facility-administered medications for this visit.   Facility-Administered Medications Ordered in Other Visits  Medication Dose Route Frequency Provider Last Rate Last Admin   0.9 %  sodium chloride infusion   Intravenous Continuous Saintclair Schroader, Charlestine Massed, NP   Stopped at 08/29/20 1509    OBJECTIVE:  [place] woman who appears stated age  Vitals:   08/29/20 1301  BP: (!) 167/87  Pulse: 77  Resp: 18  Temp: 97.6 F (36.4 C)  SpO2: 100%    Wt Readings from Last 3 Encounters:  08/29/20 136 lb 4.8 oz (61.8 kg)  08/11/20 136 lb 14.4 oz (62.1 kg)  09/20/19 136 lb 3.2 oz (61.8 kg)   Body mass index is 24.93 kg/m.    ECOG FS:1 - Symptomatic but completely  ambulatory GENERAL: Patient is a well appearing female in no acute distress HEENT:  Sclerae anicteric.  Oropharynx clear and moist. No ulcerations or evidence of oropharyngeal candidiasis. Neck is supple.  NODES:  No cervical, supraclavicular, or axillary lymphadenopathy palpated.  BREAST EXAM:  Deferred. LUNGS:  Clear to auscultation bilaterally.  No wheezes or rhonchi. HEART:  Regular rate and rhythm. No murmur appreciated. ABDOMEN:  Soft, nontender.  Positive, normoactive bowel sounds. No organomegaly palpated. MSK:  No focal spinal tenderness to palpation. Full range of motion bilaterally in the upper extremities. EXTREMITIES:  No peripheral edema.   SKIN:  Clear with no obvious rashes or skin changes. No nail dyscrasia. NEURO:  Nonfocal. Well oriented.  Appropriate affect.    LAB RESULTS:  CMP     Component Value Date/Time   NA 143 08/11/2020 1433   NA 142 06/06/2018 1639   K 4.1 08/11/2020 1433   CL 109 08/11/2020 1433   CO2 26 08/11/2020 1433   GLUCOSE 127 (H) 08/11/2020 1433   BUN 17 08/11/2020 1433   BUN 15 06/06/2018 1639   CREATININE 0.72 08/11/2020 1433   CREATININE 0.83 07/12/2018 0848   CREATININE 0.78 02/03/2016 1613   CALCIUM 9.1 08/11/2020 1433   PROT 7.1 08/11/2020 1433   PROT 6.5 06/06/2018 1639   ALBUMIN 3.7 08/11/2020 1433   ALBUMIN 4.3 06/06/2018 1639   AST 15 08/11/2020 1433   AST 17 07/12/2018 0848   ALT 9 08/11/2020 1433   ALT 12 07/12/2018 0848   ALKPHOS 112 08/11/2020 1433   BILITOT 0.3 08/11/2020 1433   BILITOT 0.5 07/12/2018 0848   GFRNONAA >60 08/11/2020 1433   GFRNONAA >60 07/12/2018 0848   GFRNONAA 82 02/03/2016 1613   GFRAA >60 07/03/2019 0920   GFRAA >60 07/12/2018 0848   GFRAA >89 02/03/2016 1613    No results found for: TOTALPROTELP, ALBUMINELP, A1GS, A2GS, BETS, BETA2SER, GAMS, MSPIKE, SPEI  No results found for: KPAFRELGTCHN, LAMBDASER, KAPLAMBRATIO  Lab Results  Component Value Date   WBC 4.3 08/11/2020   NEUTROABS 2.6  08/11/2020   HGB 10.7 (L) 08/11/2020   HCT 35.1 (L) 08/11/2020   MCV 79.6 (L) 08/11/2020   PLT 223 08/11/2020    No results found for: LABCA2  No components found for: DGLOVF643  No results for input(s): INR in the last 168 hours.  No results found for: LABCA2  No results found for: PIR518  No results found for: ACZ660  No results found for: YTK160  No results found for: CA2729  No components found for:  HGQUANT  No results found for: CEA1 / No results found for: CEA1   No results found for: AFPTUMOR  No results found for: CHROMOGRNA  No results found for: HGBA, HGBA2QUANT, HGBFQUANT, HGBSQUAN (Hemoglobinopathy evaluation)   No results found for: LDH  Lab Results  Component Value Date   IRON 28 (L) 08/11/2020   TIBC 503 (H) 08/11/2020   IRONPCTSAT 6 (L) 08/11/2020   (Iron and TIBC)  Lab Results  Component Value Date   FERRITIN 4 (L) 08/11/2020    Urinalysis No results found for: COLORURINE, APPEARANCEUR, LABSPEC, PHURINE, GLUCOSEU, HGBUR, BILIRUBINUR, KETONESUR, PROTEINUR, UROBILINOGEN, NITRITE, LEUKOCYTESUR   STUDIES:  No results found.   ELIGIBLE FOR AVAILABLE RESEARCH PROTOCOL: no   ASSESSMENT: 65 y.o. [place] woman status post right breast biopsy 07/03/2018 for a clinical T2 N0, stage IB invasive ductal carcinoma, grade 2, estrogen receptor strongly positive, progesterone receptor weakly positive, with no HER-2 amplification and an MIB-1 of 5%.  (1) neoadjuvant anastrozole started 07/12/2018, interrupted during radiation, resumed 11/28/2018  (a) bone density October 2021 shows a T score of -3.1 (osteoporosis).  (2) status post right lumpectomy and sentinel lymph node sampling 08/31/2018 for a pT2 pN0, stage IB invasive ductal carcinoma, grade 2, with close but negative margins  (a) a total of 3 sentinel lymph nodes removed  (3) MammaPrint shows a luminal type low risk tumor, predicting a 97.8% chance of being alive without  distant recurrence at 5 years with antiestrogen therapy alone [without chemotherapy]  (4) adjuvant radiation 10/05/2018 - 11/22/2018 Site Technique Total Dose Dose per Fx Completed Fx Beam Energies  Breast: Breast_Rt 3D 50.4/50.4 1.8 28/28 6X  Breast: Breast_Rt_axilla 3D 45/45 1.8 25/25 6X, 10X  Breast: Breast_Rt_Bst 3D 12/12 2 6/6 6X, 10X    (5) Genetic testing on 07/12/2018 through the Common Hereditary Gene Panel offered by [facility] found no pathogenic mutations in APC, ATM, AXIN2, BARD1, BMPR1A, BRCA1, BRCA2, BRIP1, CDH1, CDK4, CDKN2A (p14ARF), CDKN2A (p16INK4a), CHEK2, CTNNA1, DICER1, EPCAM (Deletion/duplication testing only), GREM1 (promoter region deletion/duplication testing only), KIT, MEN1, MLH1, MSH2, MSH3, MSH6, MUTYH, NBN, NF1, NHTL1, PALB2, PDGFRA, PMS2, POLD1, POLE, PTEN, RAD50, RAD51C, RAD51D, RNF43, SDHB, SDHC, SDHD, SMAD4, SMARCA4. STK11, TP53, TSC1, TSC2, and VHL.  The following genes were evaluated for sequence changes only: SDHA and HOXB13 c.251G>A variant only.   PLAN:  Estie is doing quite well today.  She has no signs of breast cancer recurrence.  She is iron deficient.  I reviewed with her that we will be replacing her iron weekly x 5 weeks intravenously with Venofer.  She understands this.    I reviewed with Lynden that the only way to lose iron is to bleed.  I verified her medications and she is not taking any blood thinners, NSAIDS.  I re entered the GI referral for re evaluation.  She understands this.  We will see her back in 09/2020 for labs and f/u.    She knows to call for any questions that may arise between now and her next appointment.  We are happy to see her sooner if needed.   Total encounter time 25 minutes.* in face to face visit time, order review, lab review, and documentation.  Wilber Bihari, NP 08/29/20 4:02 PM Medical Oncology and Hematology [facility] [place], [place] [address] Tel. [phone]    Fax.  [phone]   *Total Encounter Time as defined by the Centers for Medicare and Medicaid Services includes, in addition to  the face-to-face time of a patient visit (documented in the note above) non-face-to-face time: obtaining and reviewing outside history, ordering and reviewing medications, tests or procedures, care coordination (Hendricks with other health care professionals or caregivers) and documentation in the medical record.

## 2020-08-29 NOTE — Patient Instructions (Signed)

## 2020-09-05 ENCOUNTER — Other Ambulatory Visit: Payer: Self-pay

## 2020-09-05 ENCOUNTER — Inpatient Hospital Stay: Payer: Medicaid Other

## 2020-09-05 VITALS — BP 138/82 | HR 80 | Temp 98.0°F | Resp 17

## 2020-09-05 DIAGNOSIS — D509 Iron deficiency anemia, unspecified: Secondary | ICD-10-CM

## 2020-09-05 DIAGNOSIS — C50211 Malignant neoplasm of upper-inner quadrant of right female breast: Secondary | ICD-10-CM | POA: Diagnosis not present

## 2020-09-05 MED ORDER — SODIUM CHLORIDE 0.9 % IV SOLN
INTRAVENOUS | Status: DC
  Filled 2020-09-05: qty 250

## 2020-09-05 MED ORDER — SODIUM CHLORIDE 0.9 % IV SOLN
200.0000 mg | Freq: Once | INTRAVENOUS | Status: AC
  Administered 2020-09-05: 200 mg via INTRAVENOUS
  Filled 2020-09-05: qty 200

## 2020-09-05 NOTE — Patient Instructions (Signed)

## 2020-09-12 ENCOUNTER — Other Ambulatory Visit: Payer: Self-pay

## 2020-09-12 ENCOUNTER — Inpatient Hospital Stay: Payer: Medicaid Other

## 2020-09-12 VITALS — BP 144/72 | HR 72 | Temp 98.2°F | Resp 18 | Wt 136.0 lb

## 2020-09-12 DIAGNOSIS — C50211 Malignant neoplasm of upper-inner quadrant of right female breast: Secondary | ICD-10-CM | POA: Diagnosis not present

## 2020-09-12 DIAGNOSIS — D509 Iron deficiency anemia, unspecified: Secondary | ICD-10-CM

## 2020-09-12 MED ORDER — SODIUM CHLORIDE 0.9 % IV SOLN
200.0000 mg | Freq: Once | INTRAVENOUS | Status: AC
  Administered 2020-09-12: 200 mg via INTRAVENOUS
  Filled 2020-09-12: qty 200

## 2020-09-12 NOTE — Progress Notes (Signed)
Pt waited full 30 minute post observation after iron treatment with no issues VSS

## 2020-09-12 NOTE — Patient Instructions (Signed)

## 2020-09-13 ENCOUNTER — Other Ambulatory Visit: Payer: Self-pay | Admitting: Oncology

## 2020-09-13 ENCOUNTER — Other Ambulatory Visit: Payer: Self-pay | Admitting: Family Medicine

## 2020-09-13 DIAGNOSIS — E785 Hyperlipidemia, unspecified: Secondary | ICD-10-CM

## 2020-09-13 NOTE — Telephone Encounter (Signed)
Requested medication (s) are due for refill today: yes  Requested medication (s) are on the active medication list: yes  Last refill:  06/12/20  Future visit scheduled: yes  Notes to clinic:  overdue labs   Requested Prescriptions  Pending Prescriptions Disp Refills   simvastatin (ZOCOR) 20 MG tablet [Pharmacy Med Name: SIMVASTATIN 20MG  TAB] 30 tablet 1    Sig: TAKE ONE TABLET BY MOUTH DAILY      Cardiovascular:  Antilipid - Statins Failed - 09/13/2020 10:36 AM      Failed - Total Cholesterol in normal range and within 360 days    Cholesterol, Total  Date Value Ref Range Status  06/06/2018 175 100 - 199 mg/dL Final          Failed - LDL in normal range and within 360 days    LDL Calculated  Date Value Ref Range Status  06/06/2018 102 (H) 0 - 99 mg/dL Final          Failed - HDL in normal range and within 360 days    HDL  Date Value Ref Range Status  06/06/2018 56 >39 mg/dL Final          Failed - Triglycerides in normal range and within 360 days    Triglycerides  Date Value Ref Range Status  06/06/2018 85 0 - 149 mg/dL Final          Passed - Patient is not pregnant      Passed - Valid encounter within last 12 months    Recent Outpatient Visits           8 months ago Essential hypertension   [place], MD   11 months ago Essential hypertension   [place], [place], NP   1 year ago Postmenopausal bleeding   [place], [place], NP   1 year ago Tinea pedis of both feet   [place], Vernia Buff, NP   2 years ago [place], Vernia Buff, NP       Future Appointments             In 2 weeks [place], Dionne Bucy, PA-C [place]   In 1 month Gildardo Pounds, NP [place]

## 2020-09-15 ENCOUNTER — Encounter: Payer: Self-pay | Admitting: Oncology

## 2020-09-17 ENCOUNTER — Ambulatory Visit: Payer: Medicaid Other | Admitting: Physician Assistant

## 2020-09-17 ENCOUNTER — Other Ambulatory Visit: Payer: Self-pay

## 2020-09-17 VITALS — BP 137/75 | HR 80 | Temp 98.2°F | Resp 18 | Ht 62.0 in | Wt 139.0 lb

## 2020-09-17 DIAGNOSIS — Z170 Estrogen receptor positive status [ER+]: Secondary | ICD-10-CM

## 2020-09-17 DIAGNOSIS — D509 Iron deficiency anemia, unspecified: Secondary | ICD-10-CM

## 2020-09-17 DIAGNOSIS — I10 Essential (primary) hypertension: Secondary | ICD-10-CM | POA: Diagnosis not present

## 2020-09-17 DIAGNOSIS — C50211 Malignant neoplasm of upper-inner quadrant of right female breast: Secondary | ICD-10-CM

## 2020-09-17 DIAGNOSIS — R7303 Prediabetes: Secondary | ICD-10-CM | POA: Diagnosis not present

## 2020-09-17 DIAGNOSIS — E785 Hyperlipidemia, unspecified: Secondary | ICD-10-CM

## 2020-09-17 MED ORDER — METFORMIN HCL 500 MG PO TABS: 500.0000 mg | ORAL_TABLET | Freq: Two times a day (BID) | ORAL | 0 refills | Status: DC

## 2020-09-17 MED ORDER — SIMVASTATIN 20 MG PO TABS: 20.0000 mg | ORAL_TABLET | Freq: Every day | ORAL | 0 refills | Status: DC

## 2020-09-17 MED ORDER — LOSARTAN POTASSIUM 50 MG PO TABS: 50.0000 mg | ORAL_TABLET | Freq: Every day | ORAL | 0 refills | Status: DC

## 2020-09-17 NOTE — Progress Notes (Signed)
Patient has taken medication today. Patient has eaten today. Patient complains of knee pain and uses tylenol/ ibuprofen. Patient request all refills.

## 2020-09-17 NOTE — Progress Notes (Signed)
Established Patient Office Visit  Subjective:  Patient ID: Emily Hendricks, female    DOB: Oct 28, 1955  Age: 65 y.o. MRN: [id number]  CC:  Chief Complaint  Patient presents with   Hypertension     HPI Emily Hendricks presents for medication refills.  States that she has been compliant to her medications, does not check her blood pressure at home.  Denies any hypertensive or hypotensive symptoms.  Is currently in treatment for breast cancer.  And iron deficiency anemia.  Due to language barrier, an interpreter was present during the history-taking and subsequent discussion (and for part of the physical exam) with this patient.   Past Medical History:  Diagnosis Date   Breast cancer ([place]) 2020   Cancer ([place])    Diabetes mellitus without complication ([place])    Family history of cancer    Hyperlipidemia    Hypertension    Personal history of radiation therapy     Past Surgical History:  Procedure Laterality Date   BREAST LUMPECTOMY     BREAST LUMPECTOMY WITH RADIOACTIVE SEED AND SENTINEL LYMPH NODE BIOPSY Right 08/31/2018   Procedure: RIGHT BREAST LUMPECTOMY WITH RADIOACTIVE SEED AND SENTINEL LYMPH NODE BIOPSY;  Surgeon: Stark Klein, MD;  Location: [facility];  Service: General;  Laterality: Right;   NO PAST SURGERIES     TUBAL LIGATION      Family History  Problem Relation Age of Onset   Cancer Mother 65       unknown cancer, in her lower abdomen   Cancer Sister 80       unknown cancer in her lower abdomen   Heart disease Brother    Cancer Niece        unknown form of cancer    Social History   Socioeconomic History   Marital status: Married    Spouse name: Not on file   Number of children: 2   Years of education: Not on file   Highest education level: 12th grade  Occupational History   Not on file  Tobacco Use   Smoking status: Never   Smokeless tobacco: Never  Vaping Use   Vaping Use: Never used  Substance and Sexual Activity    Alcohol use: No   Drug use: No   Sexual activity: Not Currently  Other Topics Concern   Not on file  Social History Narrative   Not on file   Social Determinants of Health   Financial Resource Strain: Not on file  Food Insecurity: Not on file  Transportation Needs: Not on file  Physical Activity: Not on file  Stress: Not on file  Social Connections: Not on file  Intimate Partner Violence: Not on file    Outpatient Medications Prior to Visit  Medication Sig Dispense Refill   acetaminophen (TYLENOL) 500 MG tablet Take by mouth every 6 (six) hours as needed. Pt does not know dosage     anastrozole (ARIMIDEX) 1 MG tablet TAKE 1 TABLET (1 MG TOTAL) BY MOUTH DAILY. MUST HAVE OFFICE VISIT FOR ADDITIONAL REFILLS. 30 tablet 3   Cholecalciferol (VITAMIN D) 50 MCG (2000 UT) tablet Take 1 tablet (2,000 Units total) by mouth daily. 90 tablet 4   ferrous sulfate 325 (65 FE) MG EC tablet Take 1 tablet (325 mg total) by mouth daily with breakfast. 90 tablet 4   ibuprofen (ADVIL,MOTRIN) 600 MG tablet Take 1 tablet (600 mg total) by mouth every 8 (eight) hours as needed for headache. Take with food. 40 tablet 0  losartan (COZAAR) 50 MG tablet Take 1 tablet (50 mg total) by mouth daily. 90 tablet 1   metFORMIN (GLUCOPHAGE) 500 MG tablet Take 1 tablet (500 mg total) by mouth 2 (two) times daily with a meal. 60 tablet 2   simvastatin (ZOCOR) 20 MG tablet TAKE ONE TABLET BY MOUTH DAILY 30 tablet 0   triamcinolone cream (KENALOG) 0.5 % Apply 1 application topically 2 (two) times daily. (Patient not taking: Reported on 09/17/2020) 60 g 0   No facility-administered medications prior to visit.    Allergies  Allergen Reactions   Lisinopril Cough    ROS Review of Systems  Constitutional: Negative.   HENT: Negative.    Eyes: Negative.   Respiratory:  Negative for shortness of breath.   Cardiovascular:  Negative for chest pain.  Gastrointestinal: Negative.   Endocrine: Negative.   Genitourinary:  Negative.   Musculoskeletal: Negative.   Skin: Negative.   Allergic/Immunologic: Negative.   Neurological: Negative.   Hematological: Negative.   Psychiatric/Behavioral: Negative.       Objective:    Physical Exam Vitals and nursing note reviewed.  Constitutional:      Appearance: Normal appearance.  HENT:     Head: Normocephalic and atraumatic.     Right Ear: External ear normal.     Left Ear: External ear normal.     Nose: Nose normal.     Mouth/Throat:     Mouth: Mucous membranes are moist.     Pharynx: Oropharynx is clear.  Eyes:     Extraocular Movements: Extraocular movements intact.     Conjunctiva/sclera: Conjunctivae normal.     Pupils: Pupils are equal, round, and reactive to light.  Cardiovascular:     Rate and Rhythm: Normal rate and regular rhythm.     Pulses: Normal pulses.     Heart sounds: Normal heart sounds.  Pulmonary:     Effort: Pulmonary effort is normal.     Breath sounds: Normal breath sounds.  Musculoskeletal:        General: Normal range of motion.     Cervical back: Normal range of motion and neck supple.  Skin:    General: Skin is warm and dry.  Neurological:     General: No focal deficit present.     Mental Status: She is alert and oriented to person, place, and time.  Psychiatric:        Mood and Affect: Mood normal.        Behavior: Behavior normal.        Thought Content: Thought content normal.        Judgment: Judgment normal.    BP 137/75 (BP Location: Right Arm, Patient Position: Sitting, Cuff Size: Normal)   Pulse 80   Temp 98.2 F (36.8 C) (Oral)   Resp 18   Ht 5\' 2"  (1.575 m)   Wt 139 lb (63 kg)   SpO2 100%   BMI 25.42 kg/m  Wt Readings from Last 3 Encounters:  09/17/20 139 lb (63 kg)  09/12/20 136 lb (61.7 kg)  08/29/20 136 lb 4.8 oz (61.8 kg)     Health Maintenance Due  Topic Date Due   COVID-19 Vaccine (1) Never done   FOOT EXAM  Never done   OPHTHALMOLOGY EXAM  Never done   Hepatitis C Screening  Never  done   Zoster Vaccines- Shingrix (1 of 2) Never done   PNA vac Low Risk Adult (1 of 2 - PCV13) Never done   COLONOSCOPY (Pts 45-23yrs  Insurance coverage will need to be confirmed)  12/28/2019    There are no preventive care reminders to display for this patient.  Lab Results  Component Value Date   TSH 1.83 10/29/2015   Lab Results  Component Value Date   WBC 4.3 08/11/2020   HGB 10.7 (L) 08/11/2020   HCT 35.1 (L) 08/11/2020   MCV 79.6 (L) 08/11/2020   PLT 223 08/11/2020   Lab Results  Component Value Date   NA 143 08/11/2020   K 4.1 08/11/2020   CO2 26 08/11/2020   GLUCOSE 127 (H) 08/11/2020   BUN 17 08/11/2020   CREATININE 0.72 08/11/2020   BILITOT 0.3 08/11/2020   ALKPHOS 112 08/11/2020   AST 15 08/11/2020   ALT 9 08/11/2020   PROT 7.1 08/11/2020   ALBUMIN 3.7 08/11/2020   CALCIUM 9.1 08/11/2020   ANIONGAP 8 08/11/2020   Lab Results  Component Value Date   CHOL 175 06/06/2018   Lab Results  Component Value Date   HDL 56 06/06/2018   Lab Results  Component Value Date   LDLCALC 102 (H) 06/06/2018   Lab Results  Component Value Date   TRIG 85 06/06/2018   Lab Results  Component Value Date   CHOLHDL 3.1 06/06/2018   Lab Results  Component Value Date   HGBA1C 6.8 (H) 08/11/2020      Assessment & Plan:   Problem List Items Addressed This Visit       Cardiovascular and Mediastinum   Essential hypertension - Primary   Relevant Medications   simvastatin (ZOCOR) 20 MG tablet   losartan (COZAAR) 50 MG tablet     Other   Malignant neoplasm of upper-inner quadrant of right breast in female, estrogen receptor positive (HCC)   Iron deficiency anemia   Dyslipidemia   Relevant Medications   simvastatin (ZOCOR) 20 MG tablet   Pre-diabetes   Relevant Medications   metFORMIN (GLUCOPHAGE) 500 MG tablet    Meds ordered this encounter  Medications   metFORMIN (GLUCOPHAGE) 500 MG tablet    Sig: Take 1 tablet (500 mg total) by mouth 2 (two) times  daily with a meal.    Dispense:  120 tablet    Refill:  0    Order Specific Question:   Supervising Provider    Answer:   Elsie Stain [[id number]]   simvastatin (ZOCOR) 20 MG tablet    Sig: Take 1 tablet (20 mg total) by mouth daily.    Dispense:  60 tablet    Refill:  0    Order Specific Question:   Supervising Provider    Answer:   Asencion Noble E [[id number]]   losartan (COZAAR) 50 MG tablet    Sig: Take 1 tablet (50 mg total) by mouth daily.    Dispense:  60 tablet    Refill:  0    Order Specific Question:   Supervising Provider    Answer:   WRIGHT, PATRICK E [[id number]]  1. Essential hypertension Continue current regimen.  Patient encouraged to check blood pressure at home, keep a written log and have available for all office visits.  Patient has appointment with primary care provider in the middle of August 2022.  Red flags given for prompt reevaluation. - losartan (COZAAR) 50 MG tablet; Take 1 tablet (50 mg total) by mouth daily.  Dispense: 60 tablet; Refill: 0  2. Dyslipidemia Continue current regimen - simvastatin (ZOCOR) 20 MG tablet; Take 1 tablet (20 mg total) by mouth daily.  Dispense:  60 tablet; Refill: 0  3. Pre-diabetes A1c 6.8 one month ago.  Patient is currently undergoing cancer treatment. - metFORMIN (GLUCOPHAGE) 500 MG tablet; Take 1 tablet (500 mg total) by mouth 2 (two) times daily with a meal.  Dispense: 120 tablet; Refill: 0  4. Iron deficiency anemia, unspecified iron deficiency anemia type Continue follow-up with oncology  5. Malignant neoplasm of upper-inner quadrant of right breast in female, estrogen receptor positive ([employer]) Continue follow-up with oncology   I have reviewed the patient's medical history (PMH, PSH, Social History, Family History, Medications, and allergies) , and have been updated if relevant. I spent 31 minutes reviewing chart and  face to face time with patient.     Follow-up: Return if symptoms worsen or fail to improve.    Loraine Grip  [place], PA-C

## 2020-09-17 NOTE — Patient Instructions (Signed)
Please let us know if there is anything else we can do for you.  Kennieth Rad, PA-C Physician Assistant [facility] [email or web address]

## 2020-09-19 ENCOUNTER — Inpatient Hospital Stay: Payer: Medicaid Other | Attending: Nurse Practitioner

## 2020-09-19 ENCOUNTER — Other Ambulatory Visit: Payer: Self-pay

## 2020-09-19 VITALS — BP 118/66 | HR 77 | Temp 98.0°F | Resp 18

## 2020-09-19 DIAGNOSIS — D509 Iron deficiency anemia, unspecified: Secondary | ICD-10-CM | POA: Insufficient documentation

## 2020-09-19 DIAGNOSIS — Z79899 Other long term (current) drug therapy: Secondary | ICD-10-CM | POA: Diagnosis not present

## 2020-09-19 MED ORDER — SODIUM CHLORIDE 0.9 % IV SOLN
Freq: Once | INTRAVENOUS | Status: AC
  Filled 2020-09-19: qty 250

## 2020-09-19 MED ORDER — SODIUM CHLORIDE 0.9 % IV SOLN
200.0000 mg | Freq: Once | INTRAVENOUS | Status: AC
  Administered 2020-09-19: 200 mg via INTRAVENOUS
  Filled 2020-09-19: qty 200

## 2020-09-19 NOTE — Patient Instructions (Signed)

## 2020-09-26 ENCOUNTER — Other Ambulatory Visit: Payer: Self-pay

## 2020-09-26 ENCOUNTER — Inpatient Hospital Stay: Payer: Medicaid Other

## 2020-09-26 VITALS — BP 131/79 | HR 72 | Temp 97.9°F | Resp 17

## 2020-09-26 DIAGNOSIS — D509 Iron deficiency anemia, unspecified: Secondary | ICD-10-CM

## 2020-09-26 MED ORDER — SODIUM CHLORIDE 0.9 % IV SOLN
200.0000 mg | Freq: Once | INTRAVENOUS | Status: AC
  Administered 2020-09-26: 200 mg via INTRAVENOUS
  Filled 2020-09-26: qty 200

## 2020-09-26 MED ORDER — SODIUM CHLORIDE 0.9 % IV SOLN
Freq: Once | INTRAVENOUS | Status: AC
Start: 2020-09-26 — End: 2020-09-26
  Filled 2020-09-26: qty 250

## 2020-09-26 NOTE — Patient Instructions (Signed)

## 2020-10-01 ENCOUNTER — Ambulatory Visit: Payer: Medicaid Other | Admitting: Physician Assistant

## 2020-10-01 NOTE — Progress Notes (Deleted)
Patient ID: Emily Hendricks, female   DOB: 1955/04/01, 65 y.o.   MRN: [id number]   After 09/17/2020 visit with Cari: 1. Essential hypertension Continue current regimen.  Patient encouraged to check blood pressure at home, keep a written log and have available for all office visits.  Patient has appointment with primary care provider in the middle of August 2022.  Red flags given for prompt reevaluation. - losartan (COZAAR) 50 MG tablet; Take 1 tablet (50 mg total) by mouth daily.  Dispense: 60 tablet; Refill: 0   2. Dyslipidemia Continue current regimen - simvastatin (ZOCOR) 20 MG tablet; Take 1 tablet (20 mg total) by mouth daily.  Dispense: 60 tablet; Refill: 0   3. Pre-diabetes A1c 6.8 one month ago.  Patient is currently undergoing cancer treatment. - metFORMIN (GLUCOPHAGE) 500 MG tablet; Take 1 tablet (500 mg total) by mouth 2 (two) times daily with a meal.  Dispense: 120 tablet; Refill: 0   4. Iron deficiency anemia, unspecified iron deficiency anemia type Continue follow-up with oncology   5. Malignant neoplasm of upper-inner quadrant of right breast in female, estrogen receptor positive ([place]) Continue follow-up with oncology

## 2020-10-20 ENCOUNTER — Other Ambulatory Visit: Payer: Self-pay | Admitting: Physician Assistant

## 2020-10-20 ENCOUNTER — Other Ambulatory Visit: Payer: Self-pay | Admitting: Family Medicine

## 2020-10-20 DIAGNOSIS — I10 Essential (primary) hypertension: Secondary | ICD-10-CM

## 2020-10-20 DIAGNOSIS — R7303 Prediabetes: Secondary | ICD-10-CM

## 2020-10-21 ENCOUNTER — Ambulatory Visit: Payer: Medicaid Other | Admitting: Nurse Practitioner

## 2020-11-06 ENCOUNTER — Other Ambulatory Visit: Payer: Self-pay

## 2020-11-06 ENCOUNTER — Encounter: Payer: Self-pay | Admitting: Adult Health

## 2020-11-06 ENCOUNTER — Encounter: Payer: Self-pay | Admitting: Oncology

## 2020-11-06 ENCOUNTER — Inpatient Hospital Stay: Payer: Medicaid Other | Attending: Nurse Practitioner | Admitting: Adult Health

## 2020-11-06 ENCOUNTER — Inpatient Hospital Stay: Payer: Medicaid Other

## 2020-11-06 VITALS — BP 163/82 | HR 80 | Temp 97.7°F | Resp 16 | Ht 62.0 in | Wt 133.2 lb

## 2020-11-06 DIAGNOSIS — Z79811 Long term (current) use of aromatase inhibitors: Secondary | ICD-10-CM | POA: Insufficient documentation

## 2020-11-06 DIAGNOSIS — Z79899 Other long term (current) drug therapy: Secondary | ICD-10-CM | POA: Insufficient documentation

## 2020-11-06 DIAGNOSIS — Z809 Family history of malignant neoplasm, unspecified: Secondary | ICD-10-CM | POA: Insufficient documentation

## 2020-11-06 DIAGNOSIS — C50211 Malignant neoplasm of upper-inner quadrant of right female breast: Secondary | ICD-10-CM | POA: Diagnosis present

## 2020-11-06 DIAGNOSIS — E119 Type 2 diabetes mellitus without complications: Secondary | ICD-10-CM | POA: Diagnosis not present

## 2020-11-06 DIAGNOSIS — E785 Hyperlipidemia, unspecified: Secondary | ICD-10-CM | POA: Insufficient documentation

## 2020-11-06 DIAGNOSIS — Z170 Estrogen receptor positive status [ER+]: Secondary | ICD-10-CM

## 2020-11-06 DIAGNOSIS — Z923 Personal history of irradiation: Secondary | ICD-10-CM | POA: Insufficient documentation

## 2020-11-06 DIAGNOSIS — Z7984 Long term (current) use of oral hypoglycemic drugs: Secondary | ICD-10-CM | POA: Insufficient documentation

## 2020-11-06 DIAGNOSIS — I10 Essential (primary) hypertension: Secondary | ICD-10-CM | POA: Insufficient documentation

## 2020-11-06 DIAGNOSIS — D509 Iron deficiency anemia, unspecified: Secondary | ICD-10-CM | POA: Diagnosis not present

## 2020-11-06 LAB — CBC WITH DIFFERENTIAL (CANCER CENTER ONLY)
Abs Immature Granulocytes: 0.01 10*3/uL (ref 0.00–0.07)
Basophils Absolute: 0 10*3/uL (ref 0.0–0.1)
Basophils Relative: 1 %
Eosinophils Absolute: 0.1 10*3/uL (ref 0.0–0.5)
Eosinophils Relative: 2 %
HCT: 39.7 % (ref 36.0–46.0)
Hemoglobin: 13.1 g/dL (ref 12.0–15.0)
Immature Granulocytes: 0 %
Lymphocytes Relative: 31 %
Lymphs Abs: 1.3 10*3/uL (ref 0.7–4.0)
MCH: 28.4 pg (ref 26.0–34.0)
MCHC: 33 g/dL (ref 30.0–36.0)
MCV: 86.1 fL (ref 80.0–100.0)
Monocytes Absolute: 0.4 10*3/uL (ref 0.1–1.0)
Monocytes Relative: 9 %
Neutro Abs: 2.5 10*3/uL (ref 1.7–7.7)
Neutrophils Relative %: 57 %
Platelet Count: 196 10*3/uL (ref 150–400)
RBC: 4.61 MIL/uL (ref 3.87–5.11)
RDW: 16.8 % — ABNORMAL HIGH (ref 11.5–15.5)
WBC Count: 4.3 10*3/uL (ref 4.0–10.5)
nRBC: 0 % (ref 0.0–0.2)

## 2020-11-06 LAB — CMP (CANCER CENTER ONLY)
ALT: 14 U/L (ref 0–44)
AST: 17 U/L (ref 15–41)
Albumin: 4.1 g/dL (ref 3.5–5.0)
Alkaline Phosphatase: 108 U/L (ref 38–126)
Anion gap: 9 (ref 5–15)
BUN: 15 mg/dL (ref 8–23)
CO2: 25 mmol/L (ref 22–32)
Calcium: 9.4 mg/dL (ref 8.9–10.3)
Chloride: 108 mmol/L (ref 98–111)
Creatinine: 0.8 mg/dL (ref 0.44–1.00)
GFR, Estimated: >60 mL/min (ref >60)
Glucose, Bld: 89 mg/dL (ref 70–99)
Potassium: 4.1 mmol/L (ref 3.5–5.1)
Sodium: 142 mmol/L (ref 135–145)
Total Bilirubin: 0.4 mg/dL (ref 0.3–1.2)
Total Protein: 7.3 g/dL (ref 6.5–8.1)

## 2020-11-06 MED ORDER — ANASTROZOLE 1 MG PO TABS
1.0000 mg | ORAL_TABLET | Freq: Every day | ORAL | 4 refills | Status: DC
  Filled 2020-11-06: qty 30, 30d supply, fill #0

## 2020-11-06 MED ORDER — FERROUS SULFATE 325 (65 FE) MG PO TBEC
325.0000 mg | DELAYED_RELEASE_TABLET | Freq: Every day | ORAL | 4 refills | Status: DC
  Filled 2020-11-06: qty 90, 90d supply, fill #0

## 2020-11-06 NOTE — Progress Notes (Signed)
[place]  [phone] [phone]) [phone]    ID: Emily Hendricks DOB: Jan 16, 1956  MR#: [id number]  XQJ#:[id number]  Patient Care Team: Gildardo Pounds, NP as PCP - General (Nurse Practitioner) Mauro Kaufmann, RN as Oncology Nurse Navigator Rockwell Germany, RN as Oncology Nurse Navigator Stark Klein, MD as Consulting Physician (General Surgery) Magrinat, Virgie Dad, MD as Consulting Physician (Oncology) Gery Pray, MD as Consulting Physician (Radiation Oncology) OTHER MD:   CHIEF COMPLAINT: Estrogen receptor positive breast cancer  CURRENT TREATMENT: Anastrozole   INTERVAL HISTORY: Emily Hendricks returns today for follow-up of her estrogen receptor positive breast cancer accompanied by an interpreter.  The patient cannot recall if she has had the Anastrozole refilled.  She says she is not currently taking it and asked if we could send in a refill.  It is unclear how long she has been out of the medication.    Emily Hendricks also had iron deficiency anemia that was treated with weekly venofer x 5 doses.  She says that she tolerated it well.  She was referred to GI, however when GI called to schedule, her daughter declined the referral.  The patient noted today with the interpreter she didn't know about this and didn't have a concern about the GI appointment.    She underwent bilateral diagnostic mammography with tomography at [facility] on 12/28/2019 showing: breast density category C; no evidence of malignancy in either breast.   REVIEW OF SYSTEMS: Review of Systems  Constitutional:  Negative for appetite change, chills, fatigue, fever and unexpected weight change.  HENT:   Negative for hearing loss, lump/mass and trouble swallowing.   Eyes:  Negative for eye problems and icterus.  Respiratory:  Negative for chest tightness, cough and shortness of breath.   Cardiovascular:  Negative for chest pain, leg swelling and palpitations.  Gastrointestinal:   Negative for abdominal distention, abdominal pain, constipation, diarrhea, nausea and vomiting.  Endocrine: Negative for hot flashes.  Genitourinary:  Negative for difficulty urinating.   Musculoskeletal:  Negative for arthralgias.  Skin:  Negative for itching and rash.  Neurological:  Negative for dizziness, extremity weakness, headaches and numbness.  Hematological:  Negative for adenopathy. Does not bruise/bleed easily.  Psychiatric/Behavioral:  Negative for depression. The patient is not nervous/anxious.       COVID 19 VACCINATION STATUS:    HISTORY OF CURRENT ILLNESS: From the original intake note:  Emily Hendricks had a right breast lump saometime in March 2020; she went to [facility] to be evaluated.  She underwent bilateral diagnostic mammography with tomography and right breast ultrasonography at [facility] on 06/29/2018 showing: Breast Density Category C. No dominant masses, suspicious calcifications or secondary signs of malignancy are identified within the LEFT breast.   There is a spiculated mass within the upper slightly inner RIGHT breast, at far posterior depth, measuring approximately 2.2 cm greatest dimension, corresponding to the area of clinical concern. No dominant masses, suspicious calcifications or secondary signs of malignancy are identified elsewhere within the RIGHT breast. . Targeted ultrasound is performed, showing an irregular hypoechoic mass in the RIGHT breast at the 12:30 o'clock axis, 8 cm from the nipple, measuring 2.7 x 1.7 x 1.9 cm. The mass abuts and may involve the pectoralis muscle. RIGHT axilla was evaluated with ultrasound showing no enlarged or morphologically abnormal lymph nodes.  Accordingly on 07/03/2018 she proceeded to biopsy of the right breast area in question. 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 Prognostic indicators  significant for: estrogen receptor, 95% positive with strong staining intensity and progesterone receptor, 10% positive with weak staining intensity. Proliferation marker Ki67 at 5%. HER2 equivocal (2+) by immunohistochemistry but reportedly negative by fluorescent in situ hybridization.  The patient's subsequent history is as detailed below.   PAST MEDICAL HISTORY: Past Medical History:  Diagnosis Date   Breast cancer ([place]) 2020   Cancer ([place])    Diabetes mellitus without complication ([place])    Family history of cancer    Hyperlipidemia    Hypertension    Personal history of radiation therapy     PAST SURGICAL HISTORY: Past Surgical History:  Procedure Laterality Date   BREAST LUMPECTOMY     BREAST LUMPECTOMY WITH RADIOACTIVE SEED AND SENTINEL LYMPH NODE BIOPSY Right 08/31/2018   Procedure: RIGHT BREAST LUMPECTOMY WITH RADIOACTIVE SEED AND SENTINEL LYMPH NODE BIOPSY;  Surgeon: Stark Klein, MD;  Location: [facility];  Service: General;  Laterality: Right;   NO PAST SURGERIES     TUBAL LIGATION    Tubal ligation in 1990 or 1991.    FAMILY HISTORY: Family History  Problem Relation Age of Onset   Cancer Mother 74       unknown cancer, in her lower abdomen   Cancer Sister 64       unknown cancer in her lower abdomen   Heart disease Brother    Cancer Niece        unknown form of cancer  Emily Hendricks's father died from heart complications following a heart surgery about 45 years ago. Patients' mother died from unknown cancer in her 2's. The patient has 6 brothers and 1 sisters. Her sister had cancer of unknown type, but that was in her lower abdomen. Patient denies anyone else in her family having cancer.     GYNECOLOGIC HISTORY:  No LMP recorded. Patient is postmenopausal. 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 She moved here from [place], [place] in 2005. Her husband is partially disabled. At home with them is her 29 mother-in-law, her son, and her daughter-in-law. Jaquisha's son's name is [employer].  He is a Freight forwarder at [employer].  His wife is a Marine scientist.  The patient's daughter, Ronica Vivian, lives in [place] with her husband  ADVANCED DIRECTIVES: In the absence of any documentation, Ryane's spouse, is her healthcare power of attorney. The appropriate documentation is provided on 07/12/2018 for her to review, notarize, and return and her earliest convenience.  The patient intends to name her daughter Hasset Chaviano as her healthcare power of attorney.  She also has the daughter's phone number as her primary contact   HEALTH MAINTENANCE: Social History   Tobacco Use   Smoking status: Never   Smokeless tobacco: Never  Vaping Use   Vaping Use: Never used  Substance Use Topics   Alcohol use: No   Drug use: No    Colonoscopy:   PAP:   Bone density:   Allergies  Allergen Reactions   Lisinopril Cough    Current Outpatient Medications  Medication Sig Dispense Refill   acetaminophen (TYLENOL) 500 MG tablet Take by mouth every 6 (six) hours as needed. Pt does not know dosage     anastrozole (  ARIMIDEX) 1 MG tablet TAKE 1 TABLET (1 MG TOTAL) BY MOUTH DAILY. MUST HAVE OFFICE VISIT FOR ADDITIONAL REFILLS. 30 tablet 3   Cholecalciferol (VITAMIN D) 50 MCG (2000 UT) tablet Take 1 tablet (2,000 Units total) by mouth daily. 90 tablet 4   ferrous sulfate 325 (65 FE) MG EC tablet Take 1 tablet (325 mg total) by mouth daily with breakfast. 90 tablet 4   ibuprofen (ADVIL,MOTRIN) 600 MG tablet Take 1 tablet (600 mg total) by mouth every 8 (eight) hours as needed for headache. Take with food. 40 tablet 0   losartan (COZAAR) 50 MG tablet TAKE ONE TABLET BY MOUTH DAILY 60 tablet 0   metFORMIN (GLUCOPHAGE) 500 MG tablet TAKE 1 TABLET  (500 MG TOTAL) BY MOUTH TWO (TWO) TIMES DAILY WITH A MEAL. 60 tablet 1   simvastatin (ZOCOR) 20 MG tablet Take 1 tablet (20 mg total) by mouth daily. 60 tablet 0   triamcinolone cream (KENALOG) 0.5 % Apply 1 application topically 2 (two) times daily. (Patient not taking: Reported on 09/17/2020) 60 g 0   No current facility-administered medications for this visit.    OBJECTIVE:  [place] woman who appears stated age  44:   11/06/20 1419  BP: (!) 163/82  Pulse: 80  Resp: 16  Temp: 97.7 F (36.5 C)  SpO2: 100%    Wt Readings from Last 3 Encounters:  11/06/20 133 lb 3.2 oz (60.4 kg)  09/17/20 139 lb (63 kg)  09/12/20 136 lb (61.7 kg)   Body mass index is 24.36 kg/m.    ECOG FS:1 - Symptomatic but completely ambulatory GENERAL: Patient is a well appearing female in no acute distress HEENT:  Sclerae anicteric.  Oropharynx clear and moist. No ulcerations or evidence of oropharyngeal candidiasis. Neck is supple.  NODES:  No cervical, supraclavicular, or axillary lymphadenopathy palpated.  BREAST EXAM:  right breast s/p lumpectomy and radiation no sign of local recurrence, left breast benign.  LUNGS:  Clear to auscultation bilaterally.  No wheezes or rhonchi. HEART:  Regular rate and rhythm. No murmur appreciated. ABDOMEN:  Soft, nontender.  Positive, normoactive bowel sounds. No organomegaly palpated. MSK:  No focal spinal tenderness to palpation. Full range of motion bilaterally in the upper extremities. EXTREMITIES:  No peripheral edema.   SKIN:  Clear with no obvious rashes or skin changes. No nail dyscrasia. NEURO:  Nonfocal. Well oriented.  Appropriate affect.    LAB RESULTS:  CMP     Component Value Date/Time   NA 143 08/11/2020 1433   NA 142 06/06/2018 1639   K 4.1 08/11/2020 1433   CL 109 08/11/2020 1433   CO2 26 08/11/2020 1433   GLUCOSE 127 (H) 08/11/2020 1433   BUN 17 08/11/2020 1433   BUN 15 06/06/2018 1639   CREATININE 0.72 08/11/2020 1433    CREATININE 0.83 07/12/2018 0848   CREATININE 0.78 02/03/2016 1613   CALCIUM 9.1 08/11/2020 1433   PROT 7.1 08/11/2020 1433   PROT 6.5 06/06/2018 1639   ALBUMIN 3.7 08/11/2020 1433   ALBUMIN 4.3 06/06/2018 1639   AST 15 08/11/2020 1433   AST 17 07/12/2018 0848   ALT 9 08/11/2020 1433   ALT 12 07/12/2018 0848   ALKPHOS 112 08/11/2020 1433   BILITOT 0.3 08/11/2020 1433   BILITOT 0.5 07/12/2018 0848   GFRNONAA >60 08/11/2020 1433   GFRNONAA >60 07/12/2018 0848   GFRNONAA 82 02/03/2016 1613   GFRAA >60 07/03/2019 0920   GFRAA >60 07/12/2018 0848   GFRAA >89  02/03/2016 1613    No results found for: TOTALPROTELP, ALBUMINELP, A1GS, A2GS, BETS, BETA2SER, GAMS, MSPIKE, SPEI  No results found for: KPAFRELGTCHN, LAMBDASER, [facility]  Lab Results  Component Value Date   WBC 4.3 08/11/2020   NEUTROABS 2.6 08/11/2020   HGB 10.7 (L) 08/11/2020   HCT 35.1 (L) 08/11/2020   MCV 79.6 (L) 08/11/2020   PLT 223 08/11/2020    No results found for: LABCA2  No components found for: EGBTDV761  No results for input(s): INR in the last 168 hours.  No results found for: LABCA2  No results found for: YWV371  No results found for: GGY694  No results found for: WNI627  No results found for: CA2729  No components found for: HGQUANT  No results found for: CEA1 / No results found for: CEA1   No results found for: AFPTUMOR  No results found for: CHROMOGRNA  No results found for: HGBA, HGBA2QUANT, HGBFQUANT, HGBSQUAN (Hemoglobinopathy evaluation)   No results found for: LDH  Lab Results  Component Value Date   IRON 28 (L) 08/11/2020   TIBC 503 (H) 08/11/2020   IRONPCTSAT 6 (L) 08/11/2020   (Iron and TIBC)  Lab Results  Component Value Date   FERRITIN 4 (L) 08/11/2020    Urinalysis No results found for: COLORURINE, APPEARANCEUR, LABSPEC, PHURINE, GLUCOSEU, HGBUR, BILIRUBINUR, KETONESUR, PROTEINUR, UROBILINOGEN, NITRITE, LEUKOCYTESUR   STUDIES:  No results  found.   ELIGIBLE FOR AVAILABLE RESEARCH PROTOCOL: no   ASSESSMENT: 65 y.o. [place] woman status post right breast biopsy 07/03/2018 for a clinical T2 N0, stage IB invasive ductal carcinoma, grade 2, estrogen receptor strongly positive, progesterone receptor weakly positive, with no HER-2 amplification and an MIB-1 of 5%.  (1) neoadjuvant anastrozole started 07/12/2018, interrupted during radiation, resumed 11/28/2018  (a) bone density October 2021 shows a T score of -3.1 (osteoporosis).  (2) status post right lumpectomy and sentinel lymph node sampling 08/31/2018 for a pT2 pN0, stage IB invasive ductal carcinoma, grade 2, with close but negative margins  (a) a total of 3 sentinel lymph nodes removed  (3) MammaPrint shows a luminal type low risk tumor, predicting a 97.8% chance of being alive without distant recurrence at 5 years with antiestrogen therapy alone [without chemotherapy]  (4) adjuvant radiation 10/05/2018 - 11/22/2018 Site Technique Total Dose Dose per Fx Completed Fx Beam Energies  Breast: Breast_Rt 3D 50.4/50.4 1.8 28/28 6X  Breast: Breast_Rt_axilla 3D 45/45 1.8 25/25 6X, 10X  Breast: Breast_Rt_Bst 3D 12/12 2 6/6 6X, 10X    (5) Genetic testing on 07/12/2018 through the Common Hereditary Gene Panel offered by [facility] found no pathogenic mutations in APC, ATM, AXIN2, BARD1, BMPR1A, BRCA1, BRCA2, BRIP1, CDH1, CDK4, CDKN2A (p14ARF), CDKN2A (p16INK4a), CHEK2, CTNNA1, DICER1, EPCAM (Deletion/duplication testing only), GREM1 (promoter region deletion/duplication testing only), KIT, MEN1, MLH1, MSH2, MSH3, MSH6, MUTYH, NBN, NF1, NHTL1, PALB2, PDGFRA, PMS2, POLD1, POLE, PTEN, RAD50, RAD51C, RAD51D, RNF43, SDHB, SDHC, SDHD, SMAD4, SMARCA4. STK11, TP53, TSC1, TSC2, and VHL.  The following genes were evaluated for sequence changes only: SDHA and HOXB13 c.251G>A variant only.   PLAN:  Deretha is doing well today.  She has no clinical or radiographic sign of breast  cancer recurrence which is favorable.  I placed a refill for her Anastrozole today for a 90 day supply with 3 refills.  I recommended she take this daily to reduce her risk of breast cancer recurrence.  She verbalizes understanding.    I attempted twice to call the daughter both during the patient  appointment, and after her appointment and we could not get through.  I am hopeful she will return my call so I can talk to her and better understand why she declined the GI referral.    We will repeat labs today with CBC ,CMP, and iron studies to evaluate whether or not there is an improvement in her iron tests.  I recommended f/u in 3 months for lab only and in 6 months she will transition to see Dr. Lindi Adie for f/u.    She knows to call for any questions that may arise between now and her next appointment.  We are happy to see her sooner if needed.   Total encounter time 20 minutes.* in face to face visit time, order review, lab review, and documentation.  Wilber Bihari, NP 11/06/20 2:50 PM Medical Oncology and Hematology [facility] [place], [place] [address] Tel. [phone]    Fax. [phone]   *Total Encounter Time as defined by the Centers for Medicare and Medicaid Services includes, in addition to the face-to-face time of a patient visit (documented in the note above) non-face-to-face time: obtaining and reviewing outside history, ordering and reviewing medications, tests or procedures, care coordination (communications with other health care professionals or caregivers) and documentation in the medical record.

## 2020-11-07 ENCOUNTER — Telehealth: Payer: Self-pay | Admitting: Licensed Clinical Social Worker

## 2020-11-07 ENCOUNTER — Telehealth: Payer: Self-pay | Admitting: Adult Health

## 2020-11-07 ENCOUNTER — Encounter: Payer: Self-pay | Admitting: Oncology

## 2020-11-07 LAB — IRON AND TIBC
Iron: 74 ug/dL (ref 41–142)
Saturation Ratios: 20 % — ABNORMAL LOW (ref 21–57)
TIBC: 368 ug/dL (ref 236–444)
UIBC: 294 ug/dL (ref 120–384)

## 2020-11-07 LAB — FERRITIN: Ferritin: 126 ng/mL (ref 11–307)

## 2020-11-07 NOTE — Telephone Encounter (Signed)
TC to pt's daughter, Shonique Disimone, per request of NP due to concerns about medications and treatment plan compliance and being unable to reach daughter.  CSW spoke with [facility] for about 15 minutes. No resource barriers identified to accessing care, but there is significant confusion and miscommunication as mother has been coming to the appts and using an interpreter but seems to misunderstand directions which impacts daughter's ability to follow the plan.    Per daughter, pt is taking her anastrozole regularly. Daughter would like to speak with NP directly regarding multiple questions. Regarding iron infusions and GI referral, she wasn't sure if they were supposed to wait until seeing the results of the infusion to go to GI. Would like clarification around lab results and next steps (iron medication, iron infusions, GI referral).   Pt is having heavy nose bleeds- would like to know what to do about this  CSW forwarding concerns to NP. Daughter is available today, Monday (8/22), Tuesday (8/23). It is more difficult for her to speak when she is working 12 hr shifts which starts again on Wednesday.   Christeen Douglas, LCSW

## 2020-11-07 NOTE — Telephone Encounter (Signed)
Scheduled appointment per 08/18 los. Patient is aware.

## 2020-11-13 ENCOUNTER — Other Ambulatory Visit: Payer: Self-pay

## 2020-11-13 ENCOUNTER — Ambulatory Visit: Payer: Medicaid Other | Admitting: Adult Health

## 2020-11-25 ENCOUNTER — Telehealth: Payer: Self-pay | Admitting: Adult Health

## 2020-11-25 NOTE — Telephone Encounter (Signed)
I called and spoke with patient's daughter about her appointment, and we discussed her mom's iron deficiency.  I reviewed that since the iron infusion her labs had improved.  I noted that she could still take the oral iron.  I reinforced the reason behind the GI referral to rule out possible causes of blood loss.  Sweta verbalized understanding.  She plans to call [place] today to get her mom set up for a consultation.  I reviewed the patient's upcoming appointments in November and again in February.  Sweta verbalized understanding.    Time spent 20 minutes  Wilber Bihari, NP

## 2020-12-10 ENCOUNTER — Other Ambulatory Visit: Payer: Self-pay | Admitting: Physician Assistant

## 2020-12-10 DIAGNOSIS — E785 Hyperlipidemia, unspecified: Secondary | ICD-10-CM

## 2020-12-15 ENCOUNTER — Ambulatory Visit: Payer: Medicaid Other | Admitting: Nurse Practitioner

## 2020-12-16 ENCOUNTER — Ambulatory Visit: Payer: Medicaid Other | Attending: Nurse Practitioner | Admitting: Nurse Practitioner

## 2020-12-16 ENCOUNTER — Encounter: Payer: Self-pay | Admitting: Oncology

## 2020-12-16 ENCOUNTER — Encounter: Payer: Self-pay | Admitting: Nurse Practitioner

## 2020-12-16 ENCOUNTER — Other Ambulatory Visit: Payer: Self-pay

## 2020-12-16 VITALS — BP 164/99 | HR 78 | Ht 62.0 in | Wt 137.0 lb

## 2020-12-16 DIAGNOSIS — Z853 Personal history of malignant neoplasm of breast: Secondary | ICD-10-CM | POA: Diagnosis not present

## 2020-12-16 DIAGNOSIS — Z1211 Encounter for screening for malignant neoplasm of colon: Secondary | ICD-10-CM

## 2020-12-16 DIAGNOSIS — Z7984 Long term (current) use of oral hypoglycemic drugs: Secondary | ICD-10-CM | POA: Insufficient documentation

## 2020-12-16 DIAGNOSIS — E119 Type 2 diabetes mellitus without complications: Secondary | ICD-10-CM | POA: Diagnosis not present

## 2020-12-16 DIAGNOSIS — Z8249 Family history of ischemic heart disease and other diseases of the circulatory system: Secondary | ICD-10-CM | POA: Diagnosis not present

## 2020-12-16 DIAGNOSIS — R7303 Prediabetes: Secondary | ICD-10-CM

## 2020-12-16 DIAGNOSIS — R519 Headache, unspecified: Secondary | ICD-10-CM | POA: Diagnosis not present

## 2020-12-16 DIAGNOSIS — Z923 Personal history of irradiation: Secondary | ICD-10-CM | POA: Insufficient documentation

## 2020-12-16 DIAGNOSIS — E559 Vitamin D deficiency, unspecified: Secondary | ICD-10-CM

## 2020-12-16 DIAGNOSIS — D509 Iron deficiency anemia, unspecified: Secondary | ICD-10-CM | POA: Insufficient documentation

## 2020-12-16 DIAGNOSIS — I10 Essential (primary) hypertension: Secondary | ICD-10-CM | POA: Insufficient documentation

## 2020-12-16 DIAGNOSIS — Z79899 Other long term (current) drug therapy: Secondary | ICD-10-CM | POA: Diagnosis not present

## 2020-12-16 DIAGNOSIS — Z888 Allergy status to other drugs, medicaments and biological substances status: Secondary | ICD-10-CM | POA: Insufficient documentation

## 2020-12-16 DIAGNOSIS — E785 Hyperlipidemia, unspecified: Secondary | ICD-10-CM | POA: Diagnosis not present

## 2020-12-16 DIAGNOSIS — Z1159 Encounter for screening for other viral diseases: Secondary | ICD-10-CM

## 2020-12-16 MED ORDER — VITAMIN D 50 MCG (2000 UT) PO TABS
2000.0000 [IU] | ORAL_TABLET | Freq: Every day | ORAL | 4 refills | Status: DC
  Filled 2020-12-16: qty 90, 90d supply, fill #0

## 2020-12-16 MED ORDER — LOSARTAN POTASSIUM 50 MG PO TABS
50.0000 mg | ORAL_TABLET | Freq: Every day | ORAL | 1 refills | Status: DC
  Filled 2020-12-16: qty 90, 90d supply, fill #0

## 2020-12-16 MED ORDER — FERROUS SULFATE 325 (65 FE) MG PO TBEC
325.0000 mg | DELAYED_RELEASE_TABLET | Freq: Every day | ORAL | 4 refills | Status: DC
  Filled 2020-12-16: qty 90, 90d supply, fill #0

## 2020-12-16 MED ORDER — METFORMIN HCL 500 MG PO TABS
500.0000 mg | ORAL_TABLET | Freq: Two times a day (BID) | ORAL | 1 refills | Status: DC
Start: 2020-12-16 — End: 2021-01-29
  Filled 2020-12-16: qty 180, 90d supply, fill #0

## 2020-12-16 MED ORDER — AMITRIPTYLINE HCL 25 MG PO TABS
25.0000 mg | ORAL_TABLET | Freq: Every day | ORAL | 3 refills | Status: DC
  Filled 2020-12-16: qty 30, 30d supply, fill #0

## 2020-12-16 MED ORDER — ACETAMINOPHEN 500 MG PO TABS
500.0000 mg | ORAL_TABLET | Freq: Four times a day (QID) | ORAL | 1 refills | Status: AC | PRN
Start: 2020-12-16 — End: 2021-01-15
  Filled 2020-12-16: qty 60, 15d supply, fill #0

## 2020-12-16 MED ORDER — SIMVASTATIN 20 MG PO TABS
20.0000 mg | ORAL_TABLET | Freq: Every day | ORAL | 0 refills | Status: DC
  Filled 2020-12-16: qty 60, 60d supply, fill #0

## 2020-12-16 NOTE — Progress Notes (Signed)
Emily Hendricks [id number]

## 2020-12-16 NOTE — Progress Notes (Signed)
Assessment & Plan:  Emily Hendricks was seen today for hypertension.  Diagnoses and all orders for this visit:  Essential hypertension -     losartan (COZAAR) 50 MG tablet; Take 1 tablet (50 mg total) by mouth daily. Continue all antihypertensives as prescribed.  Remember to bring in your blood pressure log with you for your follow up appointment.  DASH/Mediterranean Diets are healthier choices for HTN.    Frontal headache -     acetaminophen (TYLENOL) 500 MG tablet; Take 1 tablet (500 mg total) by mouth every 6 (six) hours as needed. Pt does not know dosage -     amitriptyline (ELAVIL) 25 MG tablet; Take 1 tablet (25 mg total) by mouth at bedtime. -     Ambulatory referral to Ophthalmology  Pre-diabetes -     metFORMIN (GLUCOPHAGE) 500 MG tablet; Take 1 tablet (500 mg total) by mouth Hendricks (two) times daily with a meal. Continue blood sugar control as discussed in office today, low carbohydrate diet, and regular physical exercise as tolerated, 150 minutes per week (30 min each day, 5 days per week, or 50 min 3 days per week).   Dyslipidemia, goal LDL below 100 -     simvastatin (ZOCOR) 20 MG tablet; Take 1 tablet (20 mg total) by mouth daily. -     Lipid panel INSTRUCTIONS: Work on a low fat, heart healthy diet and participate in regular aerobic exercise program by working out at least 150 minutes per week; 5 days a week-30 minutes per day. Avoid red meat/beef/steak,  fried foods. junk foods, sodas, sugary drinks, unhealthy snacking, alcohol and smoking.  Drink at least 80 oz of water per day and monitor your carbohydrate intake daily.    Iron deficiency anemia, unspecified iron deficiency anemia type -     ferrous sulfate 325 (65 FE) MG EC tablet; Take 1 tablet (325 mg total) by mouth daily with breakfast. -     CBC -     Iron, TIBC and Ferritin Panel  Colon cancer screening -     Ambulatory referral to Gastroenterology  Need for hepatitis C screening test -     HCV Ab w Reflex to  Quant PCR  Vitamin D deficiency disease -     Cholecalciferol (VITAMIN D) 50 MCG (2000 UT) tablet; Take 1 tablet (Hendricks,000 Units total) by mouth daily. -     VITAMIN D 25 Hydroxy (Vit-D Deficiency, Fractures)   Patient has been counseled on age-appropriate routine health concerns for screening and prevention. These are reviewed and up-to-date. Referrals have been placed accordingly. Immunizations are up-to-date or declined.    Subjective:   Chief Complaint  Patient presents with   Hypertension   HPI Emily Hendricks 65 y.o. female presents to office today for blood pressure check She has a past medical history of Breast cancer ([place]) (2020), Cancer ([place]), Diabetes mellitus without complication ([place]), Family history of cancer, Hyperlipidemia, Hypertension, and Personal history of radiation therapy.    HTN Blood pressure is elevated today. She does not monitor her blood pressure at home. She is taking losartan 50 mg daily as prescribed. I have instructed her to monitor her blood pressure readings at home for the next few weeks. I do not want to start any new medications today as recent past BP readings have been normal.Denies chest pain, shortness of breath, palpitations, lightheadedness, dizziness, headaches or BLE edema.   BP Readings from Last 3 Encounters:  12/16/20 (!) 164/99  11/06/20 (!) 163/82  09/26/20  131/79    Headaches She does endorse frequent headaches and occasional nose bleeds. Nose bleeds are not related to the headaches.  Headache occurring several times a week and upon awakening. Relieving factors: tea, OTC ibuprofen 1-Hendricks tabs Lab Results  Component Value Date   HGBA1C 6.8 (H) 08/11/2020     Dyslipidemia LDL near goal with simvastatin 20 mg daily.  Lab Results  Component Value Date   LDLCALC 102 (H) 06/06/2018    Review of Systems  Constitutional:  Negative for fever, malaise/fatigue and weight loss.  HENT:  Positive for nosebleeds.   Eyes: Negative.  Negative  for blurred vision, double vision and photophobia.  Respiratory: Negative.  Negative for cough and shortness of breath.   Cardiovascular: Negative.  Negative for chest pain, palpitations and leg swelling.  Gastrointestinal: Negative.  Negative for heartburn, nausea and vomiting.  Musculoskeletal: Negative.  Negative for myalgias.  Neurological:  Positive for headaches. Negative for dizziness, focal weakness and seizures.  Psychiatric/Behavioral: Negative.  Negative for suicidal ideas.    Past Medical History:  Diagnosis Date   Breast cancer ([place]) 2020   Cancer ([place])    Diabetes mellitus without complication ([employer])    Family history of cancer    Hyperlipidemia    Hypertension    Personal history of radiation therapy     Past Surgical History:  Procedure Laterality Date   BREAST LUMPECTOMY     BREAST LUMPECTOMY WITH RADIOACTIVE SEED AND SENTINEL LYMPH NODE BIOPSY Right 08/31/2018   Procedure: RIGHT BREAST LUMPECTOMY WITH RADIOACTIVE SEED AND SENTINEL LYMPH NODE BIOPSY;  Surgeon: Stark Klein, MD;  Location: [facility];  Service: General;  Laterality: Right;   NO PAST SURGERIES     TUBAL LIGATION      Family History  Problem Relation Age of Onset   Cancer Mother 35       unknown cancer, in her lower abdomen   Cancer Sister 86       unknown cancer in her lower abdomen   Heart disease Brother    Cancer Niece        unknown form of cancer    Social History Reviewed with no changes to be made today.   Outpatient Medications Prior to Visit  Medication Sig Dispense Refill   anastrozole (ARIMIDEX) 1 MG tablet Take 1 tablet (1 mg total) by mouth daily. 90 tablet 4   ibuprofen (ADVIL,MOTRIN) 600 MG tablet Take 1 tablet (600 mg total) by mouth every 8 (eight) hours as needed for headache. Take with food. 40 tablet 0   acetaminophen (TYLENOL) 500 MG tablet Take by mouth every 6 (six) hours as needed. Pt does not know dosage     Cholecalciferol (VITAMIN D) 50 MCG (2000  UT) tablet Take 1 tablet (Hendricks,000 Units total) by mouth daily. 90 tablet 4   ferrous sulfate 325 (65 FE) MG EC tablet Take 1 tablet (325 mg total) by mouth daily with breakfast. 90 tablet 4   losartan (COZAAR) 50 MG tablet TAKE ONE TABLET BY MOUTH DAILY 60 tablet 0   metFORMIN (GLUCOPHAGE) 500 MG tablet TAKE 1 TABLET (500 MG TOTAL) BY MOUTH TWO (TWO) TIMES DAILY WITH A MEAL. 60 tablet 1   simvastatin (ZOCOR) 20 MG tablet TAKE ONE TABLET BY MOUTH DAILY 60 tablet 0   No facility-administered medications prior to visit.    Allergies  Allergen Reactions   Lisinopril Cough       Objective:    BP (!) 164/99 (BP Location:  Left Arm, Patient Position: Sitting)   Pulse 78   Ht 5\' Hendricks"  (1.575 m)   Wt 137 lb (62.1 kg)   BMI 25.06 kg/m  Wt Readings from Last 3 Encounters:  12/16/20 137 lb (62.1 kg)  11/06/20 133 lb 3.Hendricks oz (60.4 kg)  09/17/20 139 lb (63 kg)    Physical Exam Vitals and nursing note reviewed.  Constitutional:      Appearance: She is well-developed.  HENT:     Head: Normocephalic and atraumatic.  Cardiovascular:     Rate and Rhythm: Normal rate and regular rhythm.     Heart sounds: Normal heart sounds. No murmur heard.   No friction rub. No gallop.  Pulmonary:     Effort: Pulmonary effort is normal. No tachypnea or respiratory distress.     Breath sounds: Normal breath sounds. No decreased breath sounds, wheezing, rhonchi or rales.  Chest:     Chest wall: No tenderness.  Abdominal:     General: Bowel sounds are normal.     Palpations: Abdomen is soft.  Musculoskeletal:        General: No swelling. Normal range of motion.     Cervical back: Normal range of motion.  Skin:    General: Skin is warm and dry.  Neurological:     Mental Status: She is alert and oriented to person, place, and time.     Coordination: Coordination normal.  Psychiatric:        Behavior: Behavior normal. Behavior is cooperative.        Thought Content: Thought content normal.        Judgment:  Judgment normal.         Patient has been counseled extensively about nutrition and exercise as well as the importance of adherence with medications and regular follow-up. The patient was given clear instructions to go to ER or return to medical center if symptoms don't improve, worsen or new problems develop. The patient verbalized understanding.   Follow-up: Return in about 3 weeks (around 01/06/2021) for BP CHECK WITH LUKE in 3 weeks. See me in 3 months.   Gildardo Pounds, FNP-BC [facility] [place], [place]   12/16/2020, 12:44 PM

## 2020-12-17 LAB — CBC
Hematocrit: 42.6 % (ref 34.0–46.6)
Hemoglobin: 13.7 g/dL (ref 11.1–15.9)
MCH: 28.8 pg (ref 26.6–33.0)
MCHC: 32.2 g/dL (ref 31.5–35.7)
MCV: 90 fL (ref 79–97)
Platelets: 205 10*3/uL (ref 150–450)
RBC: 4.76 x10E6/uL (ref 3.77–5.28)
RDW: 13.9 % (ref 11.7–15.4)
WBC: 4.4 10*3/uL (ref 3.4–10.8)

## 2020-12-17 LAB — HCV AB W REFLEX TO QUANT PCR: HCV Ab: <0.1 s/co ratio (ref 0.0–0.9)

## 2020-12-17 LAB — IRON,TIBC AND FERRITIN PANEL
Ferritin: 151 ng/mL — ABNORMAL HIGH (ref 15–150)
Iron Saturation: 42 % (ref 15–55)
Iron: 149 ug/dL — ABNORMAL HIGH (ref 27–139)
Total Iron Binding Capacity: 351 ug/dL (ref 250–450)
UIBC: 202 ug/dL (ref 118–369)

## 2020-12-17 LAB — LIPID PANEL
Chol/HDL Ratio: 3.1 ratio (ref 0.0–4.4)
Cholesterol, Total: 179 mg/dL (ref 100–199)
HDL: 58 mg/dL (ref >39)
LDL Chol Calc (NIH): 100 mg/dL — ABNORMAL HIGH (ref 0–99)
Triglycerides: 119 mg/dL (ref 0–149)
VLDL Cholesterol Cal: 21 mg/dL (ref 5–40)

## 2020-12-17 LAB — VITAMIN D 25 HYDROXY (VIT D DEFICIENCY, FRACTURES): Vit D, 25-Hydroxy: 30.1 ng/mL (ref 30.0–100.0)

## 2020-12-17 LAB — HCV INTERPRETATION

## 2020-12-23 ENCOUNTER — Other Ambulatory Visit: Payer: Self-pay

## 2021-01-06 ENCOUNTER — Ambulatory Visit: Payer: Medicaid Other | Admitting: Pharmacist

## 2021-01-20 IMAGING — MG MM CLIP PLACEMENT
6 series · 6 of 18 positions shown · non-contrast
Comparison: Previous exam(s).

CLINICAL DATA: Status post placement of radioactive seed in the
12:30 o'clock location of the RIGHT breast using ultrasound
guidance.

EXAM:
DIAGNOSTIC RIGHT MAMMOGRAM POST ULTRASOUND-GUIDED RADIOACTIVE SEED
PLACEMENT

[R XCCL synth-2D]
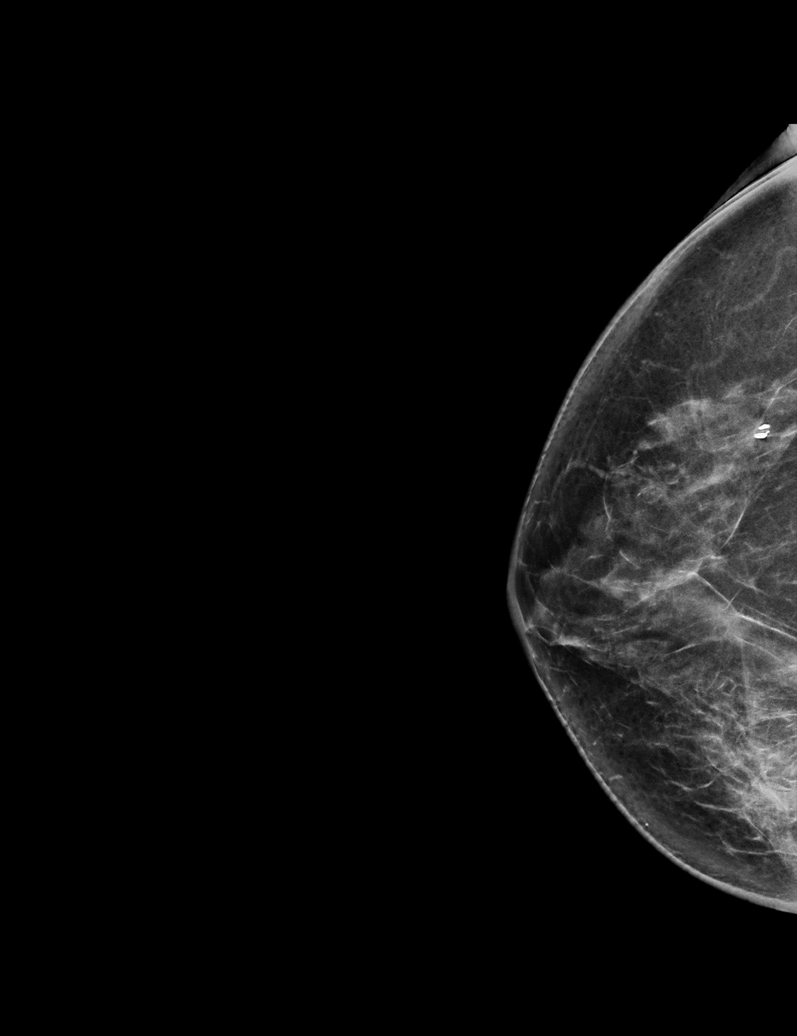

[R CC synth-2D]
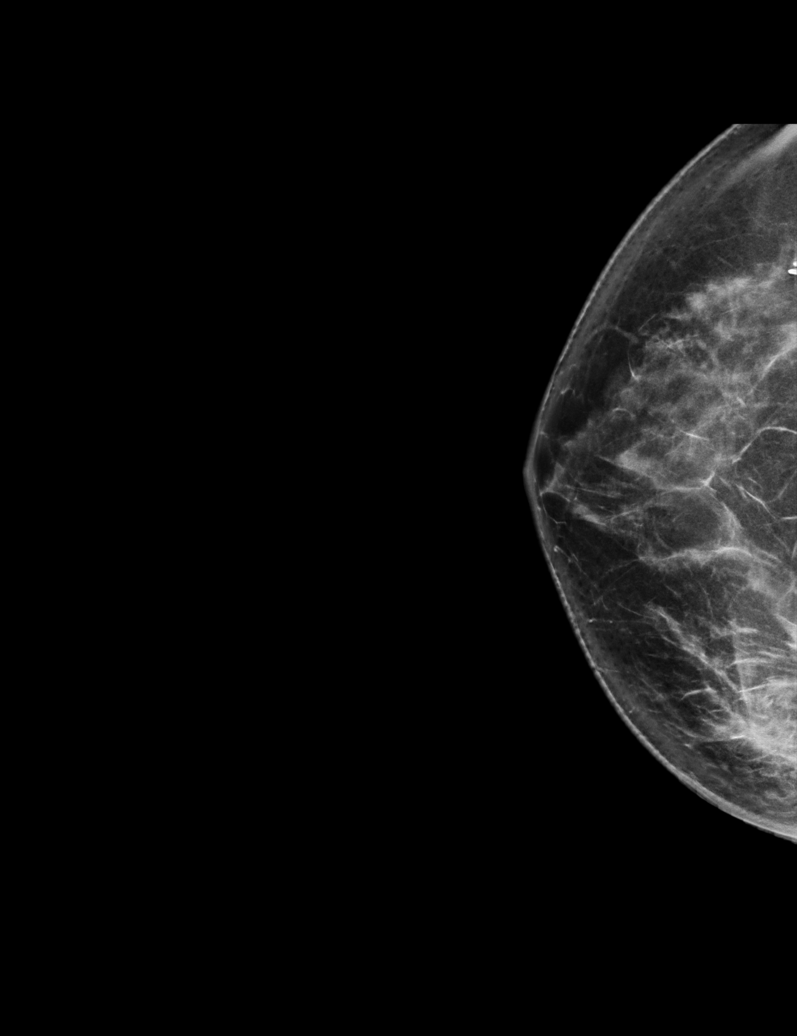

[R LM synth-2D]
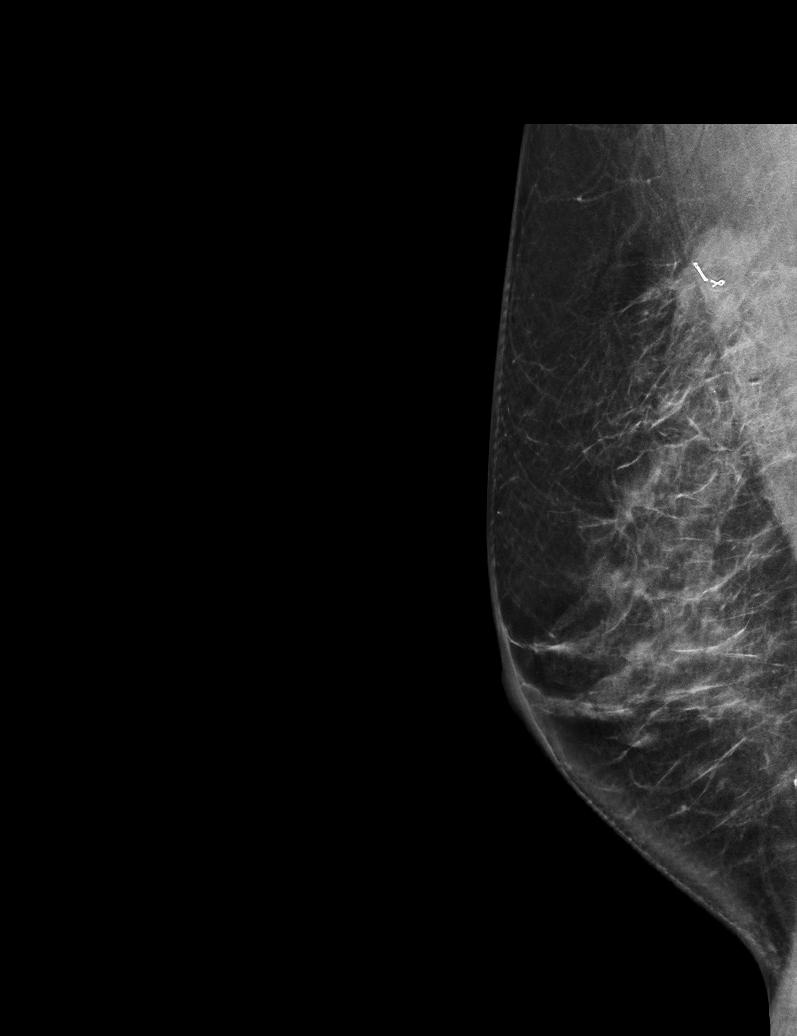

[R CC tomo · tomo slice 35/70.0]
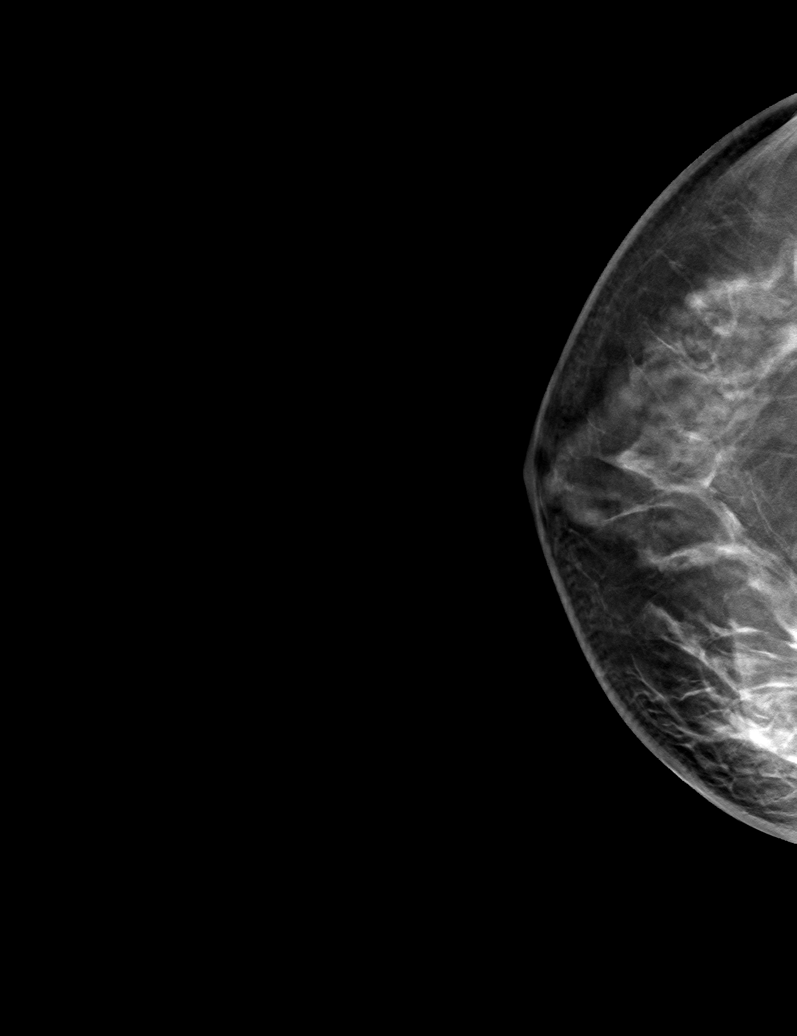

[R XCCL tomo · tomo slice 34/67.0]
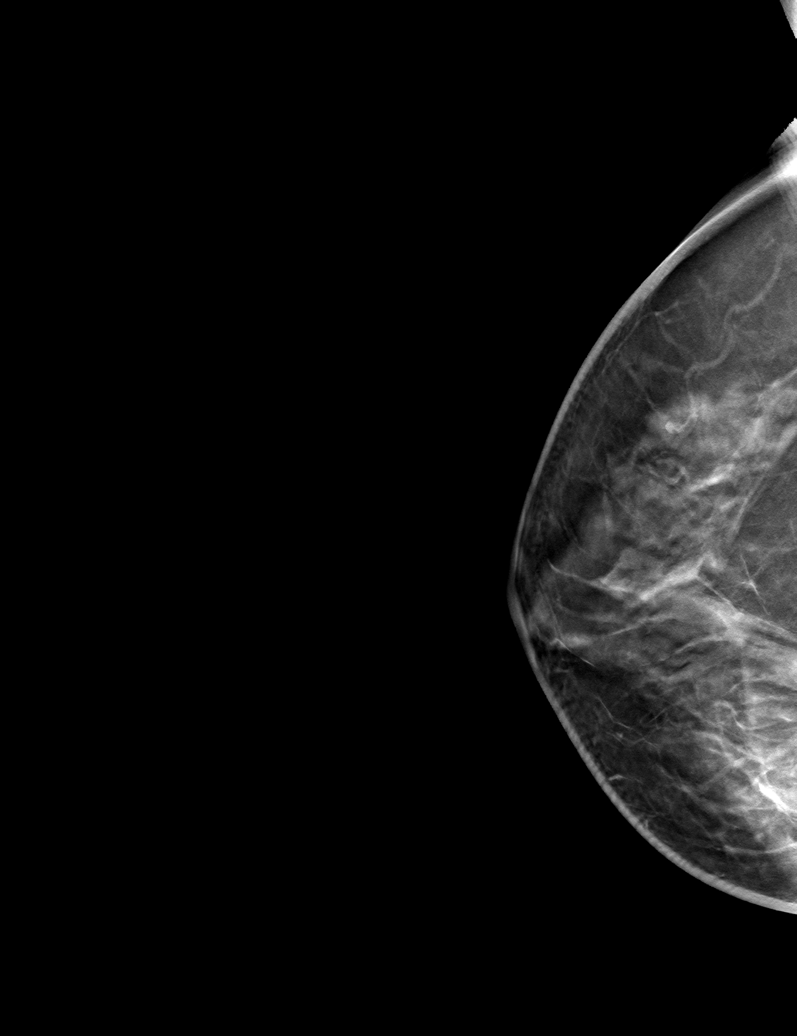

[R LM tomo · tomo slice 35/69.0]
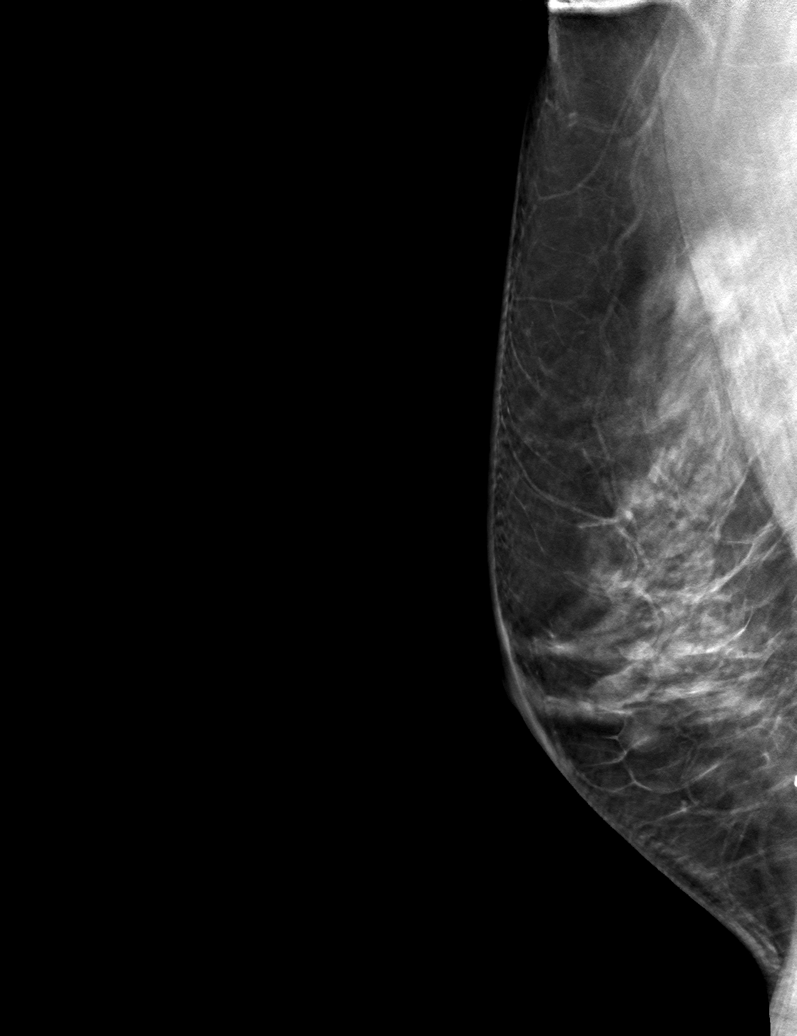

[6 of 18 positions shown; findings below may reference images not displayed]

FINDINGS: Tomographic images were obtained following ultrasound-guided
radioactive seed placement. These views confirm presence of seed
adjacent to the tissue marker clip within the mass in the posterior
UPPER INNER QUADRANT of the RIGHT breast. The clip is not seen on
the craniocaudal projection due to its location.
IMPRESSION: Appropriate location of the radioactive seed.

Final Assessment: Post Procedure Mammograms for Seed Placement

## 2021-01-21 IMAGING — DX BREAST SURGICAL SPECIMEN
1 series · 2 of 2 positions shown · non-contrast
Comparison: Previous exam(s).

CLINICAL DATA: Specimen radiograph status post right breast
lumpectomy.

EXAM:
SPECIMEN RADIOGRAPH OF THE RIGHT BREAST

[Series 2: specimen digital x-ray, derived · right · 2 of 2 slices shown]
[im 1/2]
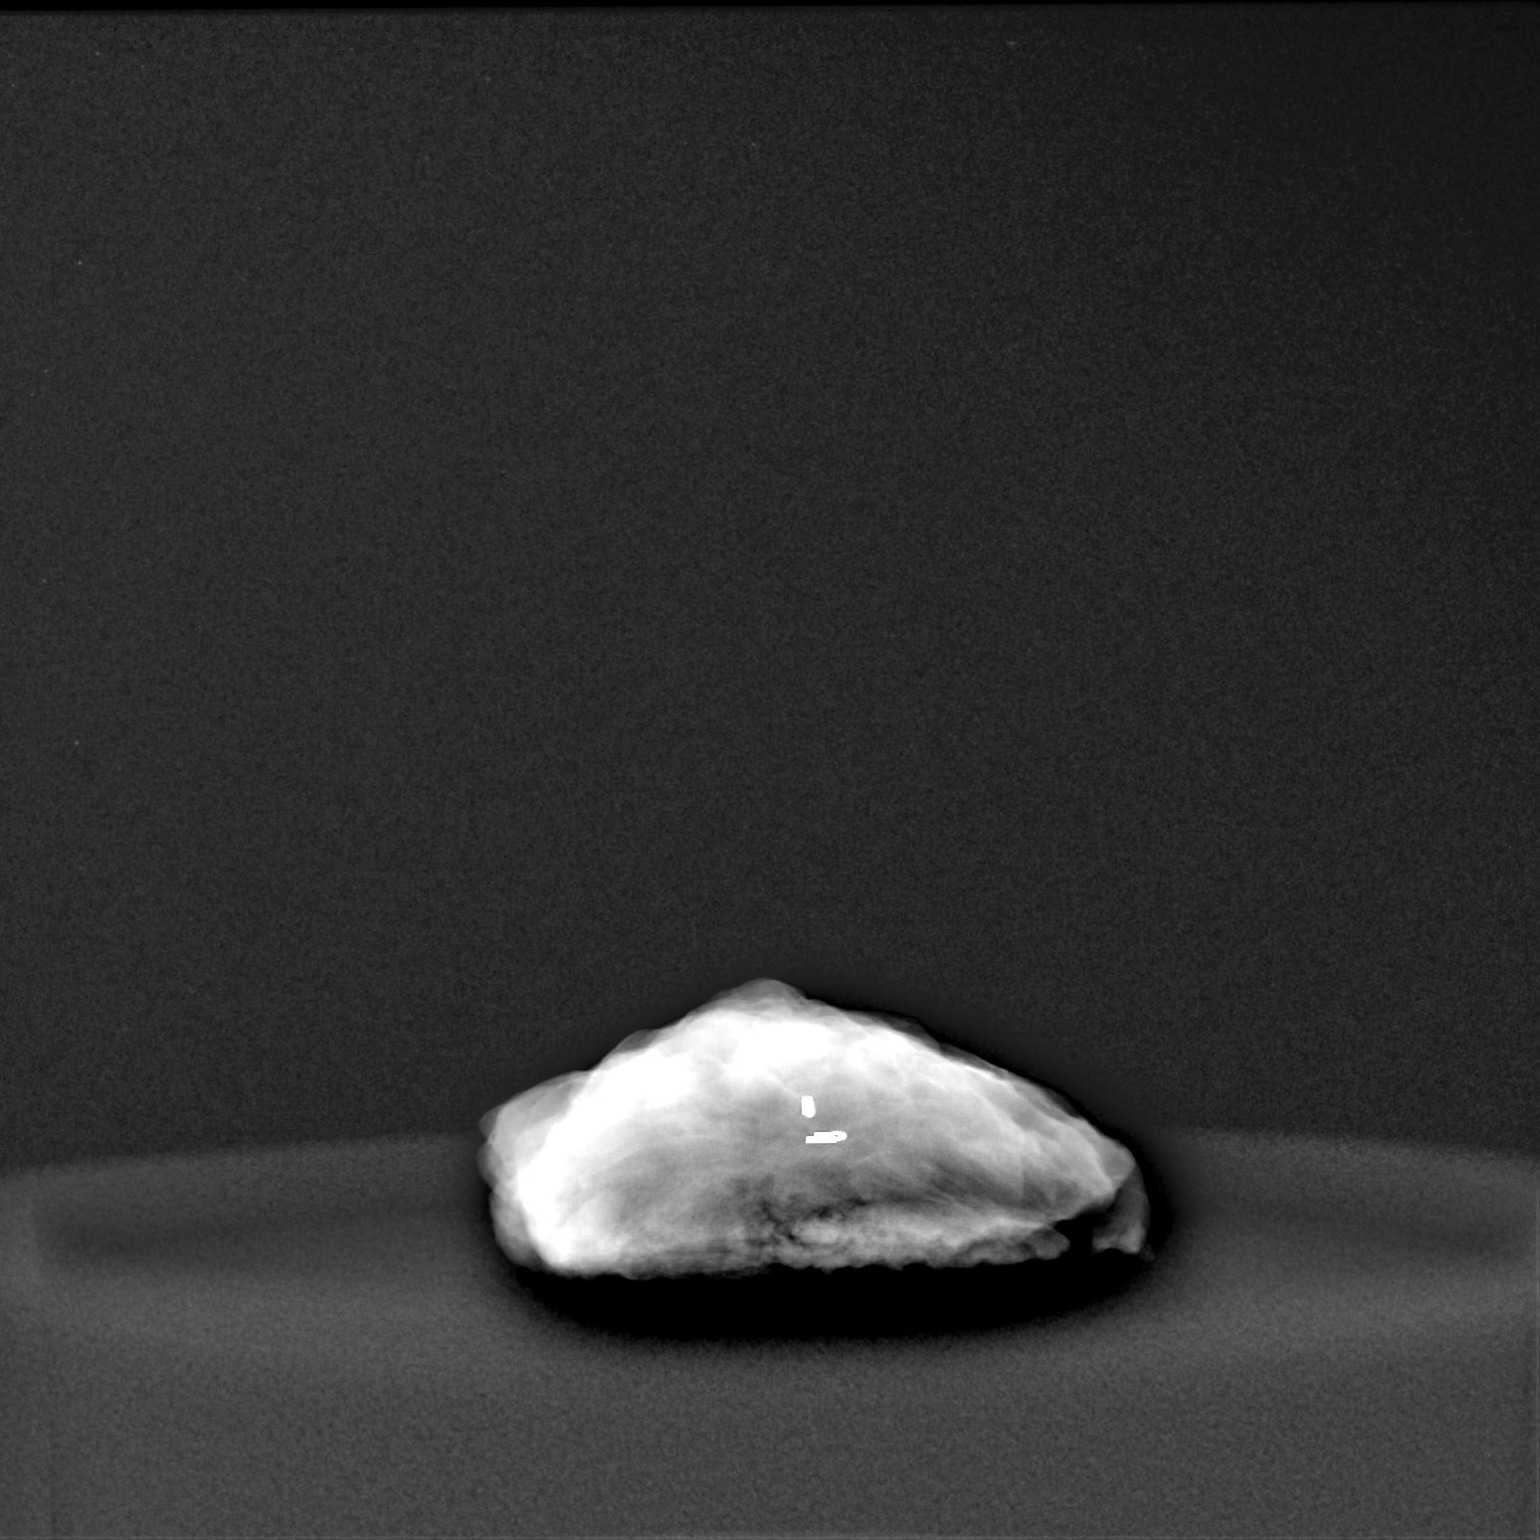
[im 2/2]
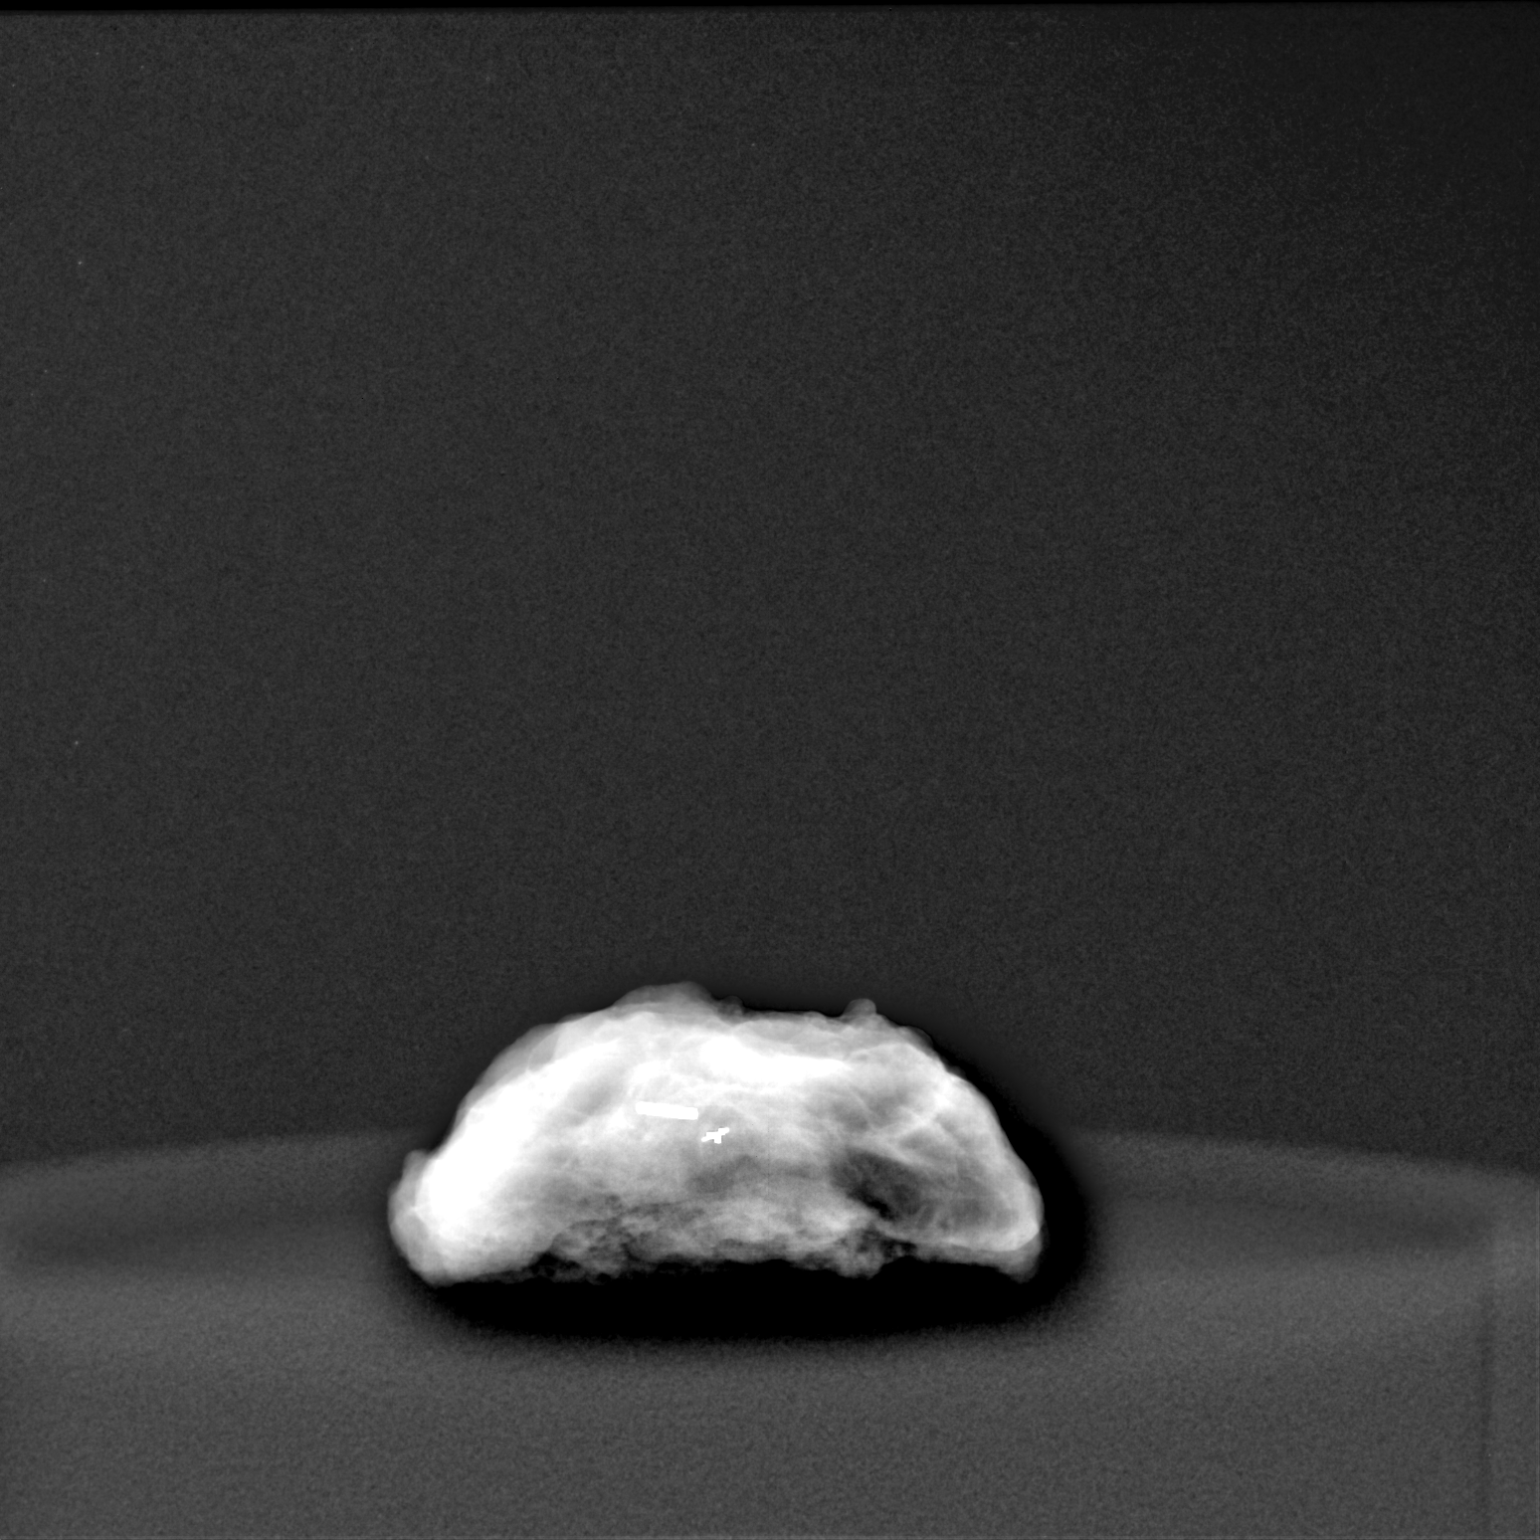

[2 of 2 positions shown; findings below may reference images not displayed]

FINDINGS: Status post excision of the right breast. The radioactive seed and
biopsy marker clip are present and completely intact. This was
communicated to the OR at [DATE] a.m.
IMPRESSION: Specimen radiograph of the right breast.

## 2021-01-29 ENCOUNTER — Other Ambulatory Visit: Payer: Self-pay | Admitting: Family Medicine

## 2021-01-29 ENCOUNTER — Other Ambulatory Visit: Payer: Self-pay | Admitting: Physician Assistant

## 2021-01-29 ENCOUNTER — Other Ambulatory Visit: Payer: Self-pay | Admitting: Oncology

## 2021-01-29 DIAGNOSIS — R7303 Prediabetes: Secondary | ICD-10-CM

## 2021-01-29 DIAGNOSIS — Z170 Estrogen receptor positive status [ER+]: Secondary | ICD-10-CM

## 2021-01-29 DIAGNOSIS — C50211 Malignant neoplasm of upper-inner quadrant of right female breast: Secondary | ICD-10-CM

## 2021-01-29 DIAGNOSIS — I10 Essential (primary) hypertension: Secondary | ICD-10-CM

## 2021-01-29 NOTE — Telephone Encounter (Signed)
Requested Prescriptions  Pending Prescriptions Disp Refills  . metFORMIN (GLUCOPHAGE) 500 MG tablet [Pharmacy Med Name: METFORMIN HCL 500MG TAB] 60 tablet 2    Sig: TAKE ONE TABLET BY MOUTH TWICE A DAY WITH A MEAL     Endocrinology:  Diabetes - Biguanides Passed - 01/29/2021 12:57 PM      Passed - Cr in normal range and within 360 days    Creatinine  Date Value Ref Range Status  11/06/2020 0.80 0.44 - 1.00 mg/dL Final   Creat  Date Value Ref Range Status  02/03/2016 0.78 0.50 - 0.99 mg/dL Final    Comment:      For patients > or = 66 years of age: The upper reference limit for Creatinine is approximately 13% higher for people identified as African-American.            Passed - HBA1C is between 0 and 7.9 and within 180 days    HbA1c, POC (prediabetic range)  Date Value Ref Range Status  09/20/2019 6.2 5.7 - 6.4 % Final   Hgb A1c MFr Bld  Date Value Ref Range Status  08/11/2020 6.8 (H) 4.8 - 5.6 % Final    Comment:    (NOTE) Pre diabetes:          5.7%-6.4%  Diabetes:              >6.4%  Glycemic control for   <7.0% adults with diabetes          Passed - eGFR in normal range and within 360 days    GFR, Est African American  Date Value Ref Range Status  02/03/2016 >89 >=60 mL/min Final   GFR, Est AFR Am  Date Value Ref Range Status  07/12/2018 >60 >60 mL/min Final   GFR calc Af Amer  Date Value Ref Range Status  07/03/2019 >60 >60 mL/min Final   GFR, Est Non African American  Date Value Ref Range Status  02/03/2016 82 >=60 mL/min Final   GFR, Estimated  Date Value Ref Range Status  11/06/2020 >60 >60 mL/min Final    Comment:    (NOTE) Calculated using the CKD-EPI Creatinine Equation (2021)          Passed - Valid encounter within last 6 months    Recent Outpatient Visits          1 month ago Essential hypertension   [place], Vernia Buff, NP   1 year ago Essential hypertension   [place], MD   1 year ago Essential hypertension   [place], [place], NP   1 year ago Postmenopausal bleeding   [place], [place], NP   1 year ago Tinea pedis of both feet   [place], Vernia Buff, NP      Future Appointments            In 1 month Gildardo Pounds, NP [place]

## 2021-02-09 ENCOUNTER — Other Ambulatory Visit: Payer: Self-pay

## 2021-02-09 ENCOUNTER — Telehealth: Payer: Self-pay | Admitting: Nurse Practitioner

## 2021-02-09 ENCOUNTER — Inpatient Hospital Stay: Payer: Medicaid Other | Attending: Nurse Practitioner

## 2021-02-09 DIAGNOSIS — C50211 Malignant neoplasm of upper-inner quadrant of right female breast: Secondary | ICD-10-CM | POA: Diagnosis not present

## 2021-02-09 DIAGNOSIS — D509 Iron deficiency anemia, unspecified: Secondary | ICD-10-CM | POA: Insufficient documentation

## 2021-02-09 DIAGNOSIS — Z170 Estrogen receptor positive status [ER+]: Secondary | ICD-10-CM | POA: Insufficient documentation

## 2021-02-09 LAB — CBC WITH DIFFERENTIAL/PLATELET
Abs Immature Granulocytes: 0.02 10*3/uL (ref 0.00–0.07)
Basophils Absolute: 0 10*3/uL (ref 0.0–0.1)
Basophils Relative: 0 %
Eosinophils Absolute: 0 10*3/uL (ref 0.0–0.5)
Eosinophils Relative: 1 %
HCT: 36.5 % (ref 36.0–46.0)
Hemoglobin: 12 g/dL (ref 12.0–15.0)
Immature Granulocytes: 0 %
Lymphocytes Relative: 20 %
Lymphs Abs: 1 10*3/uL (ref 0.7–4.0)
MCH: 29.8 pg (ref 26.0–34.0)
MCHC: 32.9 g/dL (ref 30.0–36.0)
MCV: 90.6 fL (ref 80.0–100.0)
Monocytes Absolute: 0.5 10*3/uL (ref 0.1–1.0)
Monocytes Relative: 9 %
Neutro Abs: 3.6 10*3/uL (ref 1.7–7.7)
Neutrophils Relative %: 70 %
Platelets: 254 10*3/uL (ref 150–400)
RBC: 4.03 MIL/uL (ref 3.87–5.11)
RDW: 12.1 % (ref 11.5–15.5)
WBC: 5.1 10*3/uL (ref 4.0–10.5)
nRBC: 0 % (ref 0.0–0.2)

## 2021-02-09 LAB — IRON AND TIBC
Iron: 32 ug/dL — ABNORMAL LOW (ref 41–142)
Saturation Ratios: 12 % — ABNORMAL LOW (ref 21–57)
TIBC: 269 ug/dL (ref 236–444)
UIBC: 237 ug/dL (ref 120–384)

## 2021-02-09 LAB — COMPREHENSIVE METABOLIC PANEL
ALT: 29 U/L (ref 0–44)
AST: 22 U/L (ref 15–41)
Albumin: 3.4 g/dL — ABNORMAL LOW (ref 3.5–5.0)
Alkaline Phosphatase: 87 U/L (ref 38–126)
Anion gap: 8 (ref 5–15)
BUN: 13 mg/dL (ref 8–23)
CO2: 26 mmol/L (ref 22–32)
Calcium: 8.7 mg/dL — ABNORMAL LOW (ref 8.9–10.3)
Chloride: 106 mmol/L (ref 98–111)
Creatinine, Ser: 0.75 mg/dL (ref 0.44–1.00)
GFR, Estimated: >60 mL/min (ref >60)
Glucose, Bld: 107 mg/dL — ABNORMAL HIGH (ref 70–99)
Potassium: 3.8 mmol/L (ref 3.5–5.1)
Sodium: 140 mmol/L (ref 135–145)
Total Bilirubin: 0.4 mg/dL (ref 0.3–1.2)
Total Protein: 6.9 g/dL (ref 6.5–8.1)

## 2021-02-09 LAB — FERRITIN: Ferritin: 259 ng/mL (ref 11–307)

## 2021-02-09 MED ORDER — LOSARTAN POTASSIUM 50 MG PO TABS: 50.0000 mg | ORAL_TABLET | Freq: Every day | ORAL | 0 refills | Status: DC

## 2021-02-09 NOTE — Telephone Encounter (Signed)
Requested Prescriptions  Pending Prescriptions Disp Refills  . losartan (COZAAR) 50 MG tablet 90 tablet 0    Sig: Take 1 tablet (50 mg total) by mouth daily.     Cardiovascular:  Angiotensin Receptor Blockers Failed - 02/09/2021  2:35 PM      Failed - Last BP in normal range    BP Readings from Last 1 Encounters:  12/16/20 (!) 164/99         Passed - Cr in normal range and within 180 days    Creatinine  Date Value Ref Range Status  11/06/2020 0.80 0.44 - 1.00 mg/dL Final   Creat  Date Value Ref Range Status  02/03/2016 0.78 0.50 - 0.99 mg/dL Final    Comment:      For patients > or = 66 years of age: The upper reference limit for Creatinine is approximately 13% higher for people identified as African-American.            Passed - K in normal range and within 180 days    Potassium  Date Value Ref Range Status  11/06/2020 4.1 3.5 - 5.1 mmol/L Final         Passed - Patient is not pregnant      Passed - Valid encounter within last 6 months    Recent Outpatient Visits          1 month ago Essential hypertension   [place], Vernia Buff, NP   1 year ago Essential hypertension   [place], MD   1 year ago Essential hypertension   [place], [place], NP   1 year ago Postmenopausal bleeding   [place], [place], NP   1 year ago Tinea pedis of both [place], Vernia Buff, NP      Future Appointments            In 1 month Gildardo Pounds, NP [place]           Signed Prescriptions Disp Refills   metFORMIN (GLUCOPHAGE) 500 MG tablet 60 tablet 2    Sig: TAKE ONE TABLET BY MOUTH TWICE A DAY WITH A MEAL     Endocrinology:  Diabetes - Biguanides Passed - 01/29/2021 12:57 PM      Passed - Cr in normal range and within 360 days     Creatinine  Date Value Ref Range Status  11/06/2020 0.80 0.44 - 1.00 mg/dL Final   Creat  Date Value Ref Range Status  02/03/2016 0.78 0.50 - 0.99 mg/dL Final    Comment:      For patients > or = 66 years of age: The upper reference limit for Creatinine is approximately 13% higher for people identified as African-American.            Passed - HBA1C is between 0 and 7.9 and within 180 days    HbA1c, POC (prediabetic range)  Date Value Ref Range Status  09/20/2019 6.2 5.7 - 6.4 % Final   Hgb A1c MFr Bld  Date Value Ref Range Status  08/11/2020 6.8 (H) 4.8 - 5.6 % Final    Comment:    (NOTE) Pre diabetes:          5.7%-6.4%  Diabetes:              >  6.4%  Glycemic control for   <7.0% adults with diabetes          Passed - eGFR in normal range and within 360 days    GFR, Est African American  Date Value Ref Range Status  02/03/2016 >89 >=60 mL/min Final   GFR, Est AFR Am  Date Value Ref Range Status  07/12/2018 >60 >60 mL/min Final   GFR calc Af Amer  Date Value Ref Range Status  07/03/2019 >60 >60 mL/min Final   GFR, Est Non African American  Date Value Ref Range Status  02/03/2016 82 >=60 mL/min Final   GFR, Estimated  Date Value Ref Range Status  11/06/2020 >60 >60 mL/min Final    Comment:    (NOTE) Calculated using the CKD-EPI Creatinine Equation (2021)          Passed - Valid encounter within last 6 months    Recent Outpatient Visits          1 month ago Essential hypertension   [place], Vernia Buff, NP   1 year ago Essential hypertension   [place], MD   1 year ago Essential hypertension   [place], [place], NP   1 year ago Postmenopausal bleeding   [place], [place], NP   1 year ago Tinea pedis of both feet   [place], Vernia Buff, NP       Future Appointments            In 1 month Gildardo Pounds, NP [place]

## 2021-02-09 NOTE — Telephone Encounter (Signed)
[employer] called and spoke to Garrett, Merchant navy officer about the refill(s) Losartan requested. Advised it was sent on 12/16/20 #90/0 refill(s) and Metformin sent on 01/29/21 #60/2 refills. She says the Losartan she has a 30 day supply to refill then the patient will need a refill. Metformin she has a 90 day supply on file that she will refill and the one sent on 01/29/21 will be placed on file for the next refill.

## 2021-02-09 NOTE — Addendum Note (Signed)
Addended by: Matilde Sprang on: 02/09/2021 02:35 PM   Modules accepted: Orders

## 2021-02-09 NOTE — Telephone Encounter (Signed)
Daughter of patient called in states, patient has only received 3 weeks of refill the last time it was received and she is in need of them now,She is completley out of  losartan (COZAAR) 50 MG tablet and metformin, metFORMIN (GLUCOPHAGE) 500 MG tablet.

## 2021-03-04 ENCOUNTER — Encounter: Payer: Medicaid Other | Admitting: Gastroenterology

## 2021-03-04 ENCOUNTER — Ambulatory Visit
Admission: RE | Admit: 2021-03-04 | Discharge: 2021-03-04 | Disposition: A | Discharge status: Home / Self Care | Payer: Medicaid Other | Source: Ambulatory Visit | Attending: Adult Health | Admitting: Adult Health

## 2021-03-04 DIAGNOSIS — Z170 Estrogen receptor positive status [ER+]: Secondary | ICD-10-CM

## 2021-03-12 ENCOUNTER — Other Ambulatory Visit: Payer: Self-pay | Admitting: Physician Assistant

## 2021-03-12 DIAGNOSIS — E785 Hyperlipidemia, unspecified: Secondary | ICD-10-CM

## 2021-03-18 ENCOUNTER — Other Ambulatory Visit: Payer: Self-pay

## 2021-03-18 ENCOUNTER — Ambulatory Visit: Payer: Medicaid Other | Attending: Nurse Practitioner | Admitting: Nurse Practitioner

## 2021-03-18 ENCOUNTER — Encounter: Payer: Self-pay | Admitting: Nurse Practitioner

## 2021-03-18 DIAGNOSIS — E785 Hyperlipidemia, unspecified: Secondary | ICD-10-CM | POA: Diagnosis not present

## 2021-03-18 DIAGNOSIS — D509 Iron deficiency anemia, unspecified: Secondary | ICD-10-CM

## 2021-03-18 DIAGNOSIS — I10 Essential (primary) hypertension: Secondary | ICD-10-CM | POA: Diagnosis not present

## 2021-03-18 DIAGNOSIS — R519 Headache, unspecified: Secondary | ICD-10-CM

## 2021-03-18 DIAGNOSIS — E1165 Type 2 diabetes mellitus with hyperglycemia: Secondary | ICD-10-CM

## 2021-03-18 MED ORDER — LOSARTAN POTASSIUM 50 MG PO TABS
50.0000 mg | ORAL_TABLET | Freq: Every day | ORAL | 0 refills | Status: DC
  Filled 2021-03-18: qty 90, 90d supply, fill #0

## 2021-03-18 MED ORDER — SIMVASTATIN 20 MG PO TABS
20.0000 mg | ORAL_TABLET | Freq: Every day | ORAL | 1 refills | Status: DC
  Filled 2021-03-18: qty 90, 90d supply, fill #0

## 2021-03-18 MED ORDER — AMITRIPTYLINE HCL 25 MG PO TABS
25.0000 mg | ORAL_TABLET | Freq: Every day | ORAL | 3 refills | Status: DC
  Filled 2021-03-18: qty 30, 30d supply, fill #0

## 2021-03-18 MED ORDER — FERROUS SULFATE 325 (65 FE) MG PO TABS
325.0000 mg | ORAL_TABLET | Freq: Every day | ORAL | 4 refills | Status: DC
  Filled 2021-03-18: qty 30, 30d supply, fill #0

## 2021-03-18 MED ORDER — METFORMIN HCL 500 MG PO TABS
ORAL_TABLET | ORAL | 1 refills | Status: DC
  Filled 2021-03-18: qty 180, 90d supply, fill #0

## 2021-03-18 NOTE — Progress Notes (Signed)
Virtual Visit via Telephone Note Due to national recommendations of social distancing due to [place] 19, telehealth visit is felt to be most appropriate for this patient at this time.  I discussed the limitations, risks, security and privacy concerns of performing an evaluation and management service by telephone and the availability of in person appointments. I also discussed with the patient that there may be a patient responsible charge related to this service. The patient expressed understanding and agreed to proceed.    I connected with Emily Hendricks on 03/18/21  at   1:30 PM EST  EDT by telephone and verified that I am speaking with the correct person using two identifiers.  Location of Patient: Private Residence   Location of Provider: [place] and [place] participating in Telemedicine visit: [name] [place] is interpreting for her today.  History of Present Illness: Telemedicine visit for: HTN She has a past medical history of Breast cancer  and is being followed by Dr. Jana Hakim (2020), Diabetes mellitus without complication, Family history of cancer, Hyperlipidemia, Hypertension, and Personal history of radiation therapy.   States they had a recent death in the family (mother in law) and are unable to attend the in person visit today.  Notes home readings for Blood pressure 120/80s. She is taking losartan 50 mg daily as prescribed.  BP Readings from Last 3 Encounters:  12/16/20 (!) 164/99  11/06/20 (!) 163/82  09/26/20 131/79    DM Taking metformin 500 mg BID as prescribed. Well controlled. LDL not at goal with zocor 20 mg daily.  Lab Results  Component Value Date   HGBA1C 6.8 (H) 08/11/2020    Lab Results  Component Value Date   LDLCALC 100 (H) 12/16/2020     Past Medical History:  Diagnosis Date   Breast cancer ([place]) 2020   Cancer ([place])    Diabetes mellitus without complication ([place])     Family history of cancer    Hyperlipidemia    Hypertension    Personal history of radiation therapy     Past Surgical History:  Procedure Laterality Date   BREAST LUMPECTOMY     BREAST LUMPECTOMY WITH RADIOACTIVE SEED AND SENTINEL LYMPH NODE BIOPSY Right 08/31/2018   Procedure: RIGHT BREAST LUMPECTOMY WITH RADIOACTIVE SEED AND SENTINEL LYMPH NODE BIOPSY;  Surgeon: Stark Klein, MD;  Location: [facility];  Service: General;  Laterality: Right;   NO PAST SURGERIES     TUBAL LIGATION      Family History  Problem Relation Age of Onset   Cancer Mother 25       unknown cancer, in her lower abdomen   Cancer Sister 21       unknown cancer in her lower abdomen   Heart disease Brother    Cancer Niece        unknown form of cancer    Social History   Socioeconomic History   Marital status: Married    Spouse name: Not on file   Number of children: 2   Years of education: Not on file   Highest education level: 12th grade  Occupational History   Not on file  Tobacco Use   Smoking status: Never   Smokeless tobacco: Never  Vaping Use   Vaping Use: Never used  Substance and Sexual Activity   Alcohol use: No   Drug use: No   Sexual activity: Not Currently  Other Topics Concern  Not on file  Social History Narrative   Not on file   Social Determinants of Health   Financial Resource Strain: Not on file  Food Insecurity: Not on file  Transportation Needs: Not on file  Physical Activity: Not on file  Stress: Not on file  Social Connections: Not on file     Observations/Objective: Awake, alert and oriented x 3   Review of Systems  Constitutional:  Negative for fever, malaise/fatigue and weight loss.  HENT: Negative.  Negative for nosebleeds.   Eyes: Negative.  Negative for blurred vision, double vision and photophobia.  Respiratory: Negative.  Negative for cough and shortness of breath.   Cardiovascular: Negative.  Negative for chest pain, palpitations  and leg swelling.  Gastrointestinal: Negative.  Negative for heartburn, nausea and vomiting.  Musculoskeletal: Negative.  Negative for myalgias.  Neurological: Negative.  Negative for dizziness, focal weakness, seizures and headaches.  Psychiatric/Behavioral: Negative.  Negative for suicidal ideas.    Assessment and Plan: Diagnoses and all orders for this visit:  Essential hypertension -     losartan (COZAAR) 50 MG tablet; Take 1 tablet (50 mg total) by mouth daily. Continue all antihypertensives as prescribed.  Remember to bring in your blood pressure log with you for your follow up appointment.  DASH/Mediterranean Diets are healthier choices for HTN.    Type 2 diabetes mellitus with hyperglycemia, without long-term current use of insulin (HCC) -     metFORMIN (GLUCOPHAGE) 500 MG tablet; TAKE ONE TABLET BY MOUTH TWICE A DAY WITH A MEAL Continue blood sugar control as discussed in office today, low carbohydrate diet, and regular physical exercise as tolerated, 150 minutes per week (30 min each day, 5 days per week, or 50 min 3 days per week). Keep blood sugar logs with fasting goal of 90-130 mg/dl, post prandial (after you eat) less than 180.  For Hypoglycemia: BS <60 and Hyperglycemia BS >400; contact the clinic ASAP. Annual eye exams and foot exams are recommended.   Dyslipidemia, goal LDL below 100 -     simvastatin (ZOCOR) 20 MG tablet; Take 1 tablet (20 mg total) by mouth daily. INSTRUCTIONS: Work on a low fat, heart healthy diet and participate in regular aerobic exercise program by working out at least 150 minutes per week; 5 days a week-30 minutes per day. Avoid red meat/beef/steak,  fried foods. junk foods, sodas, sugary drinks, unhealthy snacking, alcohol and smoking.  Drink at least 80 oz of water per day and monitor your carbohydrate intake daily.    Iron deficiency anemia, unspecified iron deficiency anemia type -     ferrous sulfate 325 (65 FE) MG tablet; Take 1 tablet (325 mg  total) by mouth daily with breakfast.  Frontal headache -     amitriptyline (ELAVIL) 25 MG tablet; Take 1 tablet (25 mg total) by mouth at bedtime. FOR HEADACHES     Follow Up Instructions Return for FLU VACCINE NURSE VISIT. See me in 3 months.     I discussed the assessment and treatment plan with the patient. The patient was provided an opportunity to ask questions and all were answered. The patient agreed with the plan and demonstrated an understanding of the instructions.   The patient was advised to call back or seek an in-person evaluation if the symptoms worsen or if the condition fails to improve as anticipated.  I provided 13 minutes of non-face-to-face time during this encounter including median intraservice time, reviewing previous notes, labs, imaging, medications and explaining diagnosis and  management.  Gildardo Pounds, FNP-BC

## 2021-03-20 ENCOUNTER — Other Ambulatory Visit: Payer: Self-pay

## 2021-03-23 ENCOUNTER — Other Ambulatory Visit: Payer: Self-pay | Admitting: Physician Assistant

## 2021-03-23 DIAGNOSIS — E785 Hyperlipidemia, unspecified: Secondary | ICD-10-CM

## 2021-03-27 ENCOUNTER — Ambulatory Visit: Payer: Medicaid Other

## 2021-03-27 ENCOUNTER — Ambulatory Visit: Payer: Medicaid Other | Attending: Nurse Practitioner

## 2021-03-27 ENCOUNTER — Other Ambulatory Visit: Payer: Self-pay

## 2021-03-27 ENCOUNTER — Other Ambulatory Visit: Payer: Self-pay | Admitting: Nurse Practitioner

## 2021-03-27 DIAGNOSIS — Z23 Encounter for immunization: Secondary | ICD-10-CM

## 2021-03-27 DIAGNOSIS — E1165 Type 2 diabetes mellitus with hyperglycemia: Secondary | ICD-10-CM

## 2021-03-28 LAB — HEMOGLOBIN A1C
Est. average glucose Bld gHb Est-mCnc: 126 mg/dL
Hgb A1c MFr Bld: 6 % — ABNORMAL HIGH (ref 4.8–5.6)

## 2021-03-30 ENCOUNTER — Other Ambulatory Visit: Payer: Self-pay | Admitting: Physician Assistant

## 2021-03-30 ENCOUNTER — Other Ambulatory Visit: Payer: Self-pay

## 2021-03-30 ENCOUNTER — Ambulatory Visit (AMBULATORY_SURGERY_CENTER): Payer: Medicaid Other

## 2021-03-30 VITALS — Ht 62.0 in | Wt 130.0 lb

## 2021-03-30 DIAGNOSIS — Z1211 Encounter for screening for malignant neoplasm of colon: Secondary | ICD-10-CM

## 2021-03-30 DIAGNOSIS — Z86010 Personal history of colonic polyps: Secondary | ICD-10-CM

## 2021-03-30 DIAGNOSIS — E785 Hyperlipidemia, unspecified: Secondary | ICD-10-CM

## 2021-03-30 MED ORDER — NA SULFATE-K SULFATE-MG SULF 17.5-3.13-1.6 GM/177ML PO SOLN
1.0000 | Freq: Once | ORAL | 0 refills | Status: AC
Start: 2021-03-30 — End: 2021-03-30

## 2021-03-30 NOTE — Progress Notes (Signed)
Pre visit completed with interpreter via phone call with patient's daughter; Patient's daughter verified name, DOB, and address; No egg or soy allergy known to patient  No issues known to pt with past sedation with any surgeries or procedures Patient denies ever being told they had issues or difficulty with intubation  No FH of Malignant Hyperthermia Pt is not on diet pills Pt is not on home 02  Pt is not on blood thinners  Pt denies issues with constipation at this time; No A fib or A flutter Pt is fully vaccinated for Covid x 2; NO PA's for preps discussed with pt in PV today  Discussed with pt there will be an out-of-pocket cost for prep and that varies from $0 to 70 + dollars - pt verbalized understanding  Due to the COVID-19 pandemic we are asking patients to follow certain guidelines in PV and the [place]   Pt aware of COVID protocols and [facility] guidelines

## 2021-04-06 ENCOUNTER — Other Ambulatory Visit: Payer: Self-pay | Admitting: Physician Assistant

## 2021-04-06 DIAGNOSIS — E785 Hyperlipidemia, unspecified: Secondary | ICD-10-CM

## 2021-04-09 ENCOUNTER — Other Ambulatory Visit: Payer: Self-pay | Admitting: Physician Assistant

## 2021-04-09 ENCOUNTER — Other Ambulatory Visit: Payer: Self-pay | Admitting: Nurse Practitioner

## 2021-04-09 DIAGNOSIS — E785 Hyperlipidemia, unspecified: Secondary | ICD-10-CM

## 2021-04-09 MED ORDER — SIMVASTATIN 20 MG PO TABS: 20.0000 mg | ORAL_TABLET | Freq: Every day | ORAL | 1 refills | Status: DC

## 2021-04-09 NOTE — Telephone Encounter (Signed)
Medication Refill - Medication:  simvastatin (ZOCOR) 20 MG tablet   Has the patient contacted their pharmacy? Contact PCP  Preferred Pharmacy (with phone number or street name):  [place], [place] Phone:  [phone]  Fax:  [phone]      Has the patient been seen for an appointment in the last year OR does the patient have an upcoming appointment? Yes.    Agent: Please be advised that RX refills may take up to 3 business days. We ask that you follow-up with your pharmacy.

## 2021-04-09 NOTE — Telephone Encounter (Signed)
Change in pharmacy- Rx forwarded Requested Prescriptions  Pending Prescriptions Disp Refills   simvastatin (ZOCOR) 20 MG tablet 90 tablet 1    Sig: Take 1 tablet (20 mg total) by mouth daily.     Cardiovascular:  Antilipid - Statins Failed - 04/09/2021 12:40 PM      Failed - LDL in normal range and within 360 days    LDL Chol Calc (NIH)  Date Value Ref Range Status  12/16/2020 100 (H) 0 - 99 mg/dL Final         Passed - Total Cholesterol in normal range and within 360 days    Cholesterol, Total  Date Value Ref Range Status  12/16/2020 179 100 - 199 mg/dL Final         Passed - HDL in normal range and within 360 days    HDL  Date Value Ref Range Status  12/16/2020 58 >39 mg/dL Final         Passed - Triglycerides in normal range and within 360 days    Triglycerides  Date Value Ref Range Status  12/16/2020 119 0 - 149 mg/dL Final         Passed - Patient is not pregnant      Passed - Valid encounter within last 12 months    Recent Outpatient Visits          3 weeks ago Essential hypertension   [place], Vernia Buff, NP   3 months ago Essential hypertension   [place], Vernia Buff, NP   1 year ago Essential hypertension   [place], MD   1 year ago Essential hypertension   [place], [place], NP   2 years ago Postmenopausal bleeding   [place], Amy J, NP      Future Appointments            In 1 month Gildardo Pounds, NP [place]

## 2021-04-15 ENCOUNTER — Telehealth: Payer: Self-pay | Admitting: Gastroenterology

## 2021-04-15 NOTE — Telephone Encounter (Signed)
Good morning Dr. Loletha Carrow, patient's daughter called stating she will not be able to bring mom to appointment so she rescheduled her from 04/16/21 to 05/28/21.

## 2021-04-15 NOTE — Telephone Encounter (Signed)
Understood, thanks.  - HD 

## 2021-04-16 ENCOUNTER — Encounter: Payer: Medicaid Other | Admitting: Gastroenterology

## 2021-05-08 ENCOUNTER — Other Ambulatory Visit: Payer: Self-pay

## 2021-05-08 DIAGNOSIS — C50211 Malignant neoplasm of upper-inner quadrant of right female breast: Secondary | ICD-10-CM

## 2021-05-09 NOTE — Progress Notes (Incomplete)
Patient Care Team: Emily Pounds, NP as PCP - General (Nurse Practitioner) Emily Klein, MD as Consulting Physician (General Surgery) Emily Hendricks, Emily Dad, MD (Inactive) as Consulting Physician (Oncology) Emily Pray, MD as Consulting Physician (Radiation Oncology) Emily Lose, MD as Consulting Physician (Hematology and Oncology)  DIAGNOSIS: No diagnosis found.  SUMMARY OF ONCOLOGIC HISTORY: Oncology History  Malignant neoplasm of upper-inner quadrant of right breast in female, estrogen receptor positive ([place])  07/03/2018 Initial Diagnosis   Patient felt a right breast lump, and followed up with diagnostic mammography a month later revealing a 2.7 x 1.7 x 1.9 cm mass in the right breast. Biopsy ([id number]) revealed a clinical T2 N0, stage IB invasive ductal carcinoma, grade 2, estrogen receptor strongly positive, progesterone receptor weakly positive, with no HER-2 amplification and an MIB-1 of 5%.   06/2018 - 06/2023 Anti-estrogen oral therapy   Anastrozole   07/24/2018 Genetic Testing   Negative genetic testing.  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  The Common Hereditary Gene Panel offered by [facility] includes sequencing and/or deletion duplication testing of the following 48 genes: APC, ATM, AXIN2, BARD1, BMPR1A, BRCA1, BRCA2, BRIP1, CDH1, CDK4, CDKN2A (p14ARF), CDKN2A (p16INK4a), CHEK2, CTNNA1, DICER1, EPCAM (Deletion/duplication testing only), GREM1 (promoter region deletion/duplication testing only), KIT, MEN1, MLH1, MSH2, MSH3, MSH6, MUTYH, NBN, NF1, NHTL1, PALB2, PDGFRA, PMS2, POLD1, POLE, PTEN, RAD50, RAD51C, RAD51D, RNF43, SDHB, SDHC, SDHD, SMAD4, SMARCA4. STK11, TP53, TSC1, TSC2, and VHL.  The following genes were evaluated for sequence changes only: SDHA and HOXB13 c.251G>A variant only. The report date is Jul 24, 2018.  UPDATE: BRCA1 c.5306A>G VUS was reclassified to Likely Benign as a result of re-review of the evidence in light of new variant interpretation guidelines and/or new  information. The updated report date is April 23, 2019.   08/31/2018 Cancer Staging   Staging form: Breast, AJCC 8th Edition - Pathologic stage from 08/31/2018: Stage IB (pT2, pN54m, cM0, G2, ER+, PR+, HER2-) - Signed by CGardenia Phlegm NP on 02/26/2019    08/31/2018 Surgery   Right lumpectomy and sentinel lymph node sampling (([id number] for a pT2 pN0, stage IB invasive ductal carcinoma, grade 2, with close but negative margins. 1/3 lymph nodes positive for carcinoma.    08/31/2018 Oncotype testing   MammaPrint shows a luminal type low risk tumor, predicting a 97.8% chance of being alive without distant recurrence at 5 years with antiestrogen therapy alone [without chemotherapy]   08/31/2018 Oncotype testing   The Oncotype DX score was 21 predicting a risk of outside the breast recurrence over the next 9 years of 7% if the patient's only systemic therapy is tamoxifen for 5 years.   10/05/2018 - 11/22/2018 Radiation Therapy   The patient initially received a dose of 50.4 Gy in 28 fractions to the breast using whole-breast tangent fields and 45 Gy in 25 fractions to the axilla. This was delivered using a 3-D conformal technique. The pt received a boost delivering an additional 12 Gy in 6 fractions using a electron boost with 190m electrons. The total dose was 107.4 Gy.      CHIEF COMPLIANT: Follow-up of estrogen receptor positive breast cancer   INTERVAL HISTORY: SuALMEDIA CORDELLs a [age].o. with above-mentioned history of estrogen receptor positive breast cancer. Mammogram on 03/04/2021 showed no evidence of malignancy. She presents to the clinic today for follow-up.    ALLERGIES:  is allergic to lisinopril.  MEDICATIONS:  Current Outpatient Medications  Medication Sig Dispense Refill   amitriptyline (ELAVIL) 25 MG tablet Take  1 tablet (25 mg total) by mouth at bedtime. FOR HEADACHES 30 tablet 3   anastrozole (ARIMIDEX) 1 MG tablet TAKE 1 TABLET (1 MG TOTAL) BY MOUTH DAILY. MUST  HAVE OFFICE VISIT FOR ADDITIONAL REFILLS 30 tablet 2   Cholecalciferol (VITAMIN D) 50 MCG (2000 UT) tablet Take 1 tablet (2,000 Units total) by mouth daily. 90 tablet 4   ferrous sulfate 325 (65 FE) MG tablet Take 1 tablet (325 mg total) by mouth daily with breakfast. 90 tablet 4   ibuprofen (ADVIL,MOTRIN) 600 MG tablet Take 1 tablet (600 mg total) by mouth every 8 (eight) hours as needed for headache. Take with food. 40 tablet 0   losartan (COZAAR) 50 MG tablet Take 1 tablet (50 mg total) by mouth daily. 90 tablet 0   metFORMIN (GLUCOPHAGE) 500 MG tablet TAKE ONE TABLET BY MOUTH TWICE A DAY WITH A MEAL 180 tablet 1   Multiple Vitamin (MULTIVITAMIN PO) Take 1 tablet by mouth daily at 6 (six) AM.     simvastatin (ZOCOR) 20 MG tablet Take 1 tablet (20 mg total) by mouth daily. 90 tablet 1   No current facility-administered medications for this visit.    PHYSICAL EXAMINATION: ECOG PERFORMANCE STATUS: {CHL ONC ECOG PS:[phone]}  There were no vitals filed for this visit. There were no vitals filed for this visit.  BREAST:*** No palpable masses or nodules in either right or left breasts. No palpable axillary supraclavicular or infraclavicular adenopathy no breast tenderness or nipple discharge. (exam performed in the presence of a chaperone)  LABORATORY DATA:  I have reviewed the data as listed CMP Latest Ref Rng & Units 02/09/2021 11/06/2020 08/11/2020  Glucose 70 - 99 mg/dL 107(H) 89 127(H)  BUN 8 - 23 mg/dL _0 Creatinine 0.44 - 1.00 mg/dL 0.75 0.80 0.72  Sodium 135 - 145 mmol/L 140 142 143  Potassium 3.5 - 5.1 mmol/L 3.8 4.1 4.1  Chloride 98 - 111 mmol/L 106 108 109  CO2 22 - 32 mmol/L _1 Calcium 8.9 - 10.3 mg/dL 8.7(L) 9.4 9.1  Total Protein 6.5 - 8.1 g/dL 6.9 7.3 7.1  Total Bilirubin 0.3 - 1.2 mg/dL 0.4 0.4 0.3  Alkaline Phos 38 - 126 U/L 87 108 112  AST 15 - 41 U/L _2 ALT 0 - 44 U/L _3 Lab Results  Component Value Date   WBC 5.1 02/09/2021   HGB  12.0 02/09/2021   HCT 36.5 02/09/2021   MCV 90.6 02/09/2021   PLT 254 02/09/2021   NEUTROABS 3.6 02/09/2021    ASSESSMENT & PLAN:  No problem-specific Assessment & Plan notes found for this encounter.    No orders of the defined types were placed in this encounter.  The patient has a good understanding of the overall plan. she agrees with it. she will call with any problems that may develop before the next visit here.  Total time spent: *** mins including face to face time and time spent for planning, charting and coordination of care  Rulon Eisenmenger, MD, MPH 05/09/2021  I, Thana Ates, am acting as scribe for Dr. Nicholas Hendricks.  {insert scribe attestation}

## 2021-05-11 ENCOUNTER — Inpatient Hospital Stay: Payer: Medicaid Other

## 2021-05-11 ENCOUNTER — Inpatient Hospital Stay: Payer: Medicaid Other | Admitting: Hematology and Oncology

## 2021-05-11 NOTE — Assessment & Plan Note (Signed)
08/31/2018: Right lumpectomy: T2N23mc stage Ib grade 2 IDC 1/3 lymph nodes positive microscopic ER 95%, PR 10%, HER2 2+ by IHC, FISH negative ratio 1.83, Ki-67 5% Oncotype DX: 21 MammaPrint: Low risk Adjuvant radiation 10/05/2018-11/22/2018  Current treatment: Anastrozole started October 2020 Anastrozole toxicities:  Breast cancer surveillance: 1.  Breast exam 05/11/2021: Benign 2. mammogram 03/04/2021: Benign breast density category C

## 2021-05-11 NOTE — Assessment & Plan Note (Signed)
Microcytic anemia: Due to iron deficiency GI work-up was suggested Treatment summary: Oral iron started 08/11/2020, IV iron June 2022 Lab review

## 2021-05-13 ENCOUNTER — Other Ambulatory Visit: Payer: Self-pay | Admitting: *Deleted

## 2021-05-13 DIAGNOSIS — Z170 Estrogen receptor positive status [ER+]: Secondary | ICD-10-CM

## 2021-05-13 DIAGNOSIS — C50211 Malignant neoplasm of upper-inner quadrant of right female breast: Secondary | ICD-10-CM

## 2021-05-13 MED ORDER — ANASTROZOLE 1 MG PO TABS: ORAL_TABLET | ORAL | 2 refills | Status: DC

## 2021-05-20 ENCOUNTER — Other Ambulatory Visit: Payer: Self-pay | Admitting: Nurse Practitioner

## 2021-05-20 ENCOUNTER — Other Ambulatory Visit: Payer: Self-pay | Admitting: Family Medicine

## 2021-05-20 DIAGNOSIS — E1165 Type 2 diabetes mellitus with hyperglycemia: Secondary | ICD-10-CM

## 2021-05-20 DIAGNOSIS — I10 Essential (primary) hypertension: Secondary | ICD-10-CM

## 2021-05-20 NOTE — Telephone Encounter (Signed)
Metformin refused because it's being requested too early.   03/18/2021 #180 with 1 refill was given.   Has 1 refill remaining. ?

## 2021-05-20 NOTE — Telephone Encounter (Signed)
Requested Prescriptions  ?Pending Prescriptions Disp Refills  ?? losartan (COZAAR) 50 MG tablet [Pharmacy Med Name: LOSARTAN POTASSIUM 50MG  TAB] 90 tablet 1  ?  Sig: TAKE ONE TABLET BY MOUTH DAILY  ?  ? Cardiovascular:  Angiotensin Receptor Blockers Failed - 05/20/2021  1:24 PM  ?  ?  Failed - Last BP in normal range  ?  BP Readings from Last 1 Encounters:  ?12/16/20 (!) 164/99  ?   ?  ?  Passed - Cr in normal range and within 180 days  ?  Creatinine  ?Date Value Ref Range Status  ?11/06/2020 0.80 0.44 - 1.00 mg/dL Final  ? ?Creat  ?Date Value Ref Range Status  ?02/03/2016 0.78 0.50 - 0.99 mg/dL Final  ?  Comment:  ?    ?For patients > or = 67 years of age: The upper reference limit for ?Creatinine is approximately 13% higher for people identified as ?African-American. ?  ?  ? ?Creatinine, Ser  ?Date Value Ref Range Status  ?02/09/2021 0.75 0.44 - 1.00 mg/dL Final  ?   ?  ?  Passed - K in normal range and within 180 days  ?  Potassium  ?Date Value Ref Range Status  ?02/09/2021 3.8 3.5 - 5.1 mmol/L Final  ?   ?  ?  Passed - Patient is not pregnant  ?  ?  Passed - Valid encounter within last 6 months  ?  Recent Outpatient Visits   ?      ? 2 months ago Essential hypertension  ? [place], [place] W, NP  ? 5 months ago Essential hypertension  ? [place], Vernia Buff, NP  ? 1 year ago Essential hypertension  ? [place], [place], MD  ? 1 year ago Essential hypertension  ? [place], NP  ? 2 years ago Postmenopausal bleeding  ? [place], [place], NP  ?  ?  ?Future Appointments   ?        ? In 2 days Gildardo Pounds, NP [place]  ?  ? ?  ?  ?  ? ?

## 2021-05-22 ENCOUNTER — Ambulatory Visit: Payer: Medicaid Other | Admitting: Nurse Practitioner

## 2021-05-28 ENCOUNTER — Telehealth: Payer: Self-pay | Admitting: Gastroenterology

## 2021-05-28 ENCOUNTER — Encounter: Payer: Medicaid Other | Admitting: Gastroenterology

## 2021-05-28 NOTE — Telephone Encounter (Signed)
Good Morning Dr. Loletha Carrow ? ?I called this patient '@10'$ :15am and I spoke with the patients daughter and she stated she never received a call regarding the "NEW"  appointment dates.  She will call back once she speaks to her mother to reschedule appointment. I have NO SHOWED this patient. ?

## 2021-05-28 NOTE — Telephone Encounter (Signed)
Please see note below. Pt and her daughter will need to be contacted to reschedule [place] colonoscopy and receive new instructions. Thanks  ?

## 2021-05-28 NOTE — Telephone Encounter (Signed)
[place], ? ?Please see phone note from 04/15/21 for reschedule colonoscopy. ? ?There has been some miscommunication with our office, patient and her daughter where patient was reportedly not aware of today's colonoscopy. ? ?Please arrange [place] pre-visit nurse to contact patient's daughter to reschedule the colonoscopy and review prep (either in person or virtual). ? ?- HD ?

## 2021-05-28 NOTE — Telephone Encounter (Signed)
Called daughter, no answer, left a message for her to call us back to make appointment for PV and colon procedure. ?

## 2021-05-28 NOTE — Telephone Encounter (Signed)
Called pt's daughter, no answer. Left a message for the daughter to call us back to make pre-visit appointment.  ?

## 2021-05-29 NOTE — Telephone Encounter (Signed)
Called # listed for pt's daughter again today, no answer, left a message for her to call us back our # given. ?

## 2021-05-29 NOTE — Progress Notes (Incomplete)
? ?Patient Care Team: ?Emily Pounds, NP as PCP - General (Nurse Practitioner) ?Stark Klein, MD as Consulting Physician (General Surgery) ?Magrinat, Virgie Dad, MD (Inactive) as Consulting Physician (Oncology) ?Gery Pray, MD as Consulting Physician (Radiation Oncology) ?Nicholas Lose, MD as Consulting Physician (Hematology and Oncology) ? ?DIAGNOSIS: No diagnosis found. ? ?SUMMARY OF ONCOLOGIC HISTORY: ?Oncology History  ?Malignant neoplasm of upper-inner quadrant of right breast in female, estrogen receptor positive ([place])  ?07/03/2018 Initial Diagnosis  ? Patient felt a right breast lump, and followed up with diagnostic mammography a month later revealing a 2.7 x 1.7 x 1.9 cm mass in the right breast. Biopsy ([id number]) revealed a clinical T2 N0, stage IB invasive ductal carcinoma, grade 2, estrogen receptor strongly positive, progesterone receptor weakly positive, with no HER-2 amplification and an MIB-1 of 5%. ?  ?06/2018 - 06/2023 Anti-estrogen oral therapy  ? Anastrozole ?  ?07/24/2018 Genetic Testing  ? Negative genetic testing.  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  The Common Hereditary Gene Panel offered by [facility] includes sequencing and/or deletion duplication testing of the following 48 genes: APC, ATM, AXIN2, BARD1, BMPR1A, BRCA1, BRCA2, BRIP1, CDH1, CDK4, CDKN2A (p14ARF), CDKN2A (p16INK4a), CHEK2, CTNNA1, DICER1, EPCAM (Deletion/duplication testing only), GREM1 (promoter region deletion/duplication testing only), KIT, MEN1, MLH1, MSH2, MSH3, MSH6, MUTYH, NBN, NF1, NHTL1, PALB2, PDGFRA, PMS2, POLD1, POLE, PTEN, RAD50, RAD51C, RAD51D, RNF43, SDHB, SDHC, SDHD, SMAD4, SMARCA4. STK11, TP53, TSC1, TSC2, and VHL.  The following genes were evaluated for sequence changes only: SDHA and HOXB13 c.251G>A variant only. The report date is Jul 24, 2018. ? ?UPDATE: BRCA1 c.5306A>G VUS was reclassified to Likely Benign as a result of re-review of the evidence in light of new variant interpretation guidelines and/or new  information. The updated report date is April 23, 2019. ?  ?08/31/2018 Cancer Staging  ? Staging form: Breast, AJCC 8th Edition ?- Pathologic stage from 08/31/2018: Stage IB (pT2, pN69m, cM0, G2, ER+, PR+, HER2-) - Signed by CGardenia Phlegm NP on 02/26/2019 ? ?  ?08/31/2018 Surgery  ? Right lumpectomy and sentinel lymph node sampling (([id number] for a pT2 pN0, stage IB invasive ductal carcinoma, grade 2, with close but negative margins. 1/3 lymph nodes positive for carcinoma.  ?  ?08/31/2018 Oncotype testing  ? MammaPrint shows a luminal type low risk tumor, predicting a 97.8% chance of being alive without distant recurrence at 5 years with antiestrogen therapy alone [without chemotherapy] ?  ?08/31/2018 Oncotype testing  ? The Oncotype DX score was 21 predicting a risk of outside the breast recurrence over the next 9 years of 7% if the patient's only systemic therapy is tamoxifen for 5 years. ?  ?10/05/2018 - 11/22/2018 Radiation Therapy  ? The patient initially received a dose of 50.4 Gy in 28 fractions to the breast using whole-breast tangent fields and 45 Gy in 25 fractions to the axilla. This was delivered using a 3-D conformal technique. The pt received a boost delivering an additional 12 Gy in 6 fractions using a electron boost with 168m electrons. The total dose was 107.4 Gy.  ?  ? ?CHIEF COMPLIANT: Estrogen receptor positive breast cancer ? ?INTERVAL HISTORY: Emily GOELZs a 66 year- old with the above mentioned ?Estrogen receptor positive breast cancer. She presents to the clinic today for a follow-up accompanied by an interpreter. ? ?ALLERGIES:  is allergic to lisinopril. ? ?MEDICATIONS:  ?Current Outpatient Medications  ?Medication Sig Dispense Refill  ? amitriptyline (ELAVIL) 25 MG tablet Take 1 tablet (25 mg total) by mouth  at bedtime. FOR HEADACHES 30 tablet 3  ? anastrozole (ARIMIDEX) 1 MG tablet TAKE 1 TABLET (1 MG TOTAL) BY MOUTH DAILY. MUST HAVE OFFICE VISIT FOR ADDITIONAL REFILLS  90 tablet 2  ? Cholecalciferol (VITAMIN D) 50 MCG (2000 UT) tablet Take 1 tablet (2,000 Units total) by mouth daily. 90 tablet 4  ? ferrous sulfate 325 (65 FE) MG tablet Take 1 tablet (325 mg total) by mouth daily with breakfast. 90 tablet 4  ? ibuprofen (ADVIL,MOTRIN) 600 MG tablet Take 1 tablet (600 mg total) by mouth every 8 (eight) hours as needed for headache. Take with food. 40 tablet 0  ? losartan (COZAAR) 50 MG tablet TAKE ONE TABLET BY MOUTH DAILY 90 tablet 0  ? metFORMIN (GLUCOPHAGE) 500 MG tablet TAKE ONE TABLET BY MOUTH TWICE A DAY WITH A MEAL 180 tablet 1  ? Multiple Vitamin (MULTIVITAMIN PO) Take 1 tablet by mouth daily at 6 (six) AM.    ? simvastatin (ZOCOR) 20 MG tablet Take 1 tablet (20 mg total) by mouth daily. 90 tablet 1  ? ?No current facility-administered medications for this visit.  ? ? ?PHYSICAL EXAMINATION: ?ECOG PERFORMANCE STATUS: {CHL ONC ECOG MR:[id number]} ? ?There were no vitals filed for this visit. ?There were no vitals filed for this visit. ? ?BREAST:*** No palpable masses or nodules in either right or left breasts. No palpable axillary supraclavicular or infraclavicular adenopathy no breast tenderness or nipple discharge. (exam performed in the presence of a chaperone) ? ?LABORATORY DATA:  ?I have reviewed the data as listed ?CMP Latest Ref Rng & Units 02/09/2021 11/06/2020 08/11/2020  ?Glucose 70 - 99 mg/dL 107(H) 89 127(H)  ?BUN 8 - 23 mg/dL _0 ?Creatinine 0.44 - 1.00 mg/dL 0.75 0.80 0.72  ?Sodium 135 - 145 mmol/L 140 142 143  ?Potassium 3.5 - 5.1 mmol/L 3.8 4.1 4.1  ?Chloride 98 - 111 mmol/L 106 108 109  ?CO2 22 - 32 mmol/L _1 ?Calcium 8.9 - 10.3 mg/dL 8.7(L) 9.4 9.1  ?Total Protein 6.5 - 8.1 g/dL 6.9 7.3 7.1  ?Total Bilirubin 0.3 - 1.2 mg/dL 0.4 0.4 0.3  ?Alkaline Phos 38 - 126 U/L 87 108 112  ?AST 15 - 41 U/L _2 ?ALT 0 - 44 U/L _3 ? ? ?Lab Results  ?Component Value Date  ? WBC 5.1 02/09/2021  ? HGB 12.0 02/09/2021  ? HCT 36.5 02/09/2021  ? MCV 90.6  02/09/2021  ? PLT 254 02/09/2021  ? NEUTROABS 3.6 02/09/2021  ? ? ?ASSESSMENT & PLAN:  ?No problem-specific Assessment & Plan notes found for this encounter. ? ? ? ?No orders of the defined types were placed in this encounter. ? ?The patient has a good understanding of the overall plan. she agrees with it. she will call with any problems that may develop before the next visit here. ?Total time spent: 30 mins including face to face time and time spent for planning, charting and co-ordination of care ? ? Suzzette Righter, CMA ?05/29/21 ? ? ? I, Jaclynne Baldo Lannie Fields, am acting as a scribe for Dr. Lindi Adie  ?

## 2021-06-02 ENCOUNTER — Inpatient Hospital Stay: Payer: Medicaid Other | Attending: Adult Health

## 2021-06-02 ENCOUNTER — Inpatient Hospital Stay: Payer: Medicaid Other | Admitting: Hematology and Oncology

## 2021-06-02 NOTE — Assessment & Plan Note (Deleted)
07/03/2018: T2N0 stage Ib grade 2 IDC ER/PR positive HER2 negative Ki-67 5% ?07/12/2018: Neoadjuvant anastrozole ?08/31/2018: Right lumpectomy: T2N0 stage Ib grade 2 IDC, margins negative, 0/3 lymph nodes, MammaPrint: Low risk luminal type a ?10/05/2018-11/22/2018: Adjuvant radiation ? ?Current treatment: Anastrozole 1 mg daily ?Anastrozole toxicities: None ?Breast cancer surveillance: Mammogram 03/04/2021: Benign breast density category C ? ?Iron deficiency anemia: ?Lab review: ?02/09/2021: Hemoglobin 12, iron saturation 12%, ferritin 259 ?

## 2021-06-12 ENCOUNTER — Other Ambulatory Visit: Payer: Self-pay | Admitting: Family Medicine

## 2021-06-12 DIAGNOSIS — E1165 Type 2 diabetes mellitus with hyperglycemia: Secondary | ICD-10-CM

## 2021-07-16 ENCOUNTER — Other Ambulatory Visit: Payer: Self-pay | Admitting: Family Medicine

## 2021-07-16 DIAGNOSIS — E1165 Type 2 diabetes mellitus with hyperglycemia: Secondary | ICD-10-CM

## 2021-07-24 ENCOUNTER — Encounter: Payer: Self-pay | Admitting: Oncology

## 2021-07-24 ENCOUNTER — Ambulatory Visit: Payer: Self-pay

## 2021-07-24 NOTE — Telephone Encounter (Signed)
?  Chief Complaint: foot pain ?Symptoms: R foot on the bottom pain 6-7 when standing and tender  ?Frequency: 1 month  ?Pertinent Negatives: NA ?Disposition: '[]'$ ED /'[]'$ Urgent Care (no appt availability in office) / '[x]'$ Appointment(In office/virtual)/ '[]'$  [place] [facility]/ '[]'$ Home Care/ '[]'$ Refused Recommended Disposition /'[]'$ [place] [facility]/ '[]'$  Follow-up with PCP ?Additional Notes: pt's daughter is also asking for referral for podiatry for foot pain d/t possibly callus or corns.  ? ?Summary: pain on bottom of foot,tender  ? Pt daughter stated pt has pain on the bottom of her right foot feels tender. Pt daughter stated the provider has checked before, but pain is coming and going.   ?   ?Pt seeking clinical advice  ? ?No appointments are available.   ?  ? ?Reason for Disposition ? Caused by known bunions, plantar wart, or flat feet ? ?Answer Assessment - Initial Assessment Questions ?1. ONSET: "When did the pain start?"  ?    1 month ago, on and off ?2. LOCATION: "Where is the pain located?"  ?    R foot bottom of foot, callus or corns  ?3. PAIN: "How bad is the pain?"    (Scale 1-10; or mild, moderate, severe) ? - MILD (1-3): doesn't interfere with normal activities.  ? - MODERATE (4-7): interferes with normal activities (e.g., work or school) or awakens from sleep, limping.  ? - SEVERE (8-10): excruciating pain, unable to do any normal activities, unable to walk.  ?    When sitting tolerable, 6/7 when standing on foot  ?6. OTHER SYMPTOMS: "Do you have any other symptoms?" (e.g., leg pain, rash, fever, numbness) ?    tenderness ? ?Protocols used: Foot Pain-A-AH ? ?

## 2021-07-27 ENCOUNTER — Inpatient Hospital Stay (HOSPITAL_BASED_OUTPATIENT_CLINIC_OR_DEPARTMENT_OTHER): Payer: Medicaid Other | Admitting: Hematology and Oncology

## 2021-07-27 ENCOUNTER — Inpatient Hospital Stay: Payer: Medicaid Other | Attending: Adult Health

## 2021-07-27 ENCOUNTER — Other Ambulatory Visit: Payer: Self-pay

## 2021-07-27 DIAGNOSIS — Z9221 Personal history of antineoplastic chemotherapy: Secondary | ICD-10-CM | POA: Diagnosis not present

## 2021-07-27 DIAGNOSIS — C50211 Malignant neoplasm of upper-inner quadrant of right female breast: Secondary | ICD-10-CM | POA: Diagnosis not present

## 2021-07-27 DIAGNOSIS — D509 Iron deficiency anemia, unspecified: Secondary | ICD-10-CM

## 2021-07-27 DIAGNOSIS — Z7984 Long term (current) use of oral hypoglycemic drugs: Secondary | ICD-10-CM | POA: Insufficient documentation

## 2021-07-27 DIAGNOSIS — Z923 Personal history of irradiation: Secondary | ICD-10-CM | POA: Insufficient documentation

## 2021-07-27 DIAGNOSIS — Z170 Estrogen receptor positive status [ER+]: Secondary | ICD-10-CM | POA: Insufficient documentation

## 2021-07-27 DIAGNOSIS — Z79811 Long term (current) use of aromatase inhibitors: Secondary | ICD-10-CM | POA: Insufficient documentation

## 2021-07-27 DIAGNOSIS — Z79899 Other long term (current) drug therapy: Secondary | ICD-10-CM | POA: Diagnosis not present

## 2021-07-27 LAB — CMP (CANCER CENTER ONLY)
ALT: 13 U/L (ref 0–44)
AST: 17 U/L (ref 15–41)
Albumin: 4.2 g/dL (ref 3.5–5.0)
Alkaline Phosphatase: 76 U/L (ref 38–126)
Anion gap: 6 (ref 5–15)
BUN: 17 mg/dL (ref 8–23)
CO2: 28 mmol/L (ref 22–32)
Calcium: 9.4 mg/dL (ref 8.9–10.3)
Chloride: 106 mmol/L (ref 98–111)
Creatinine: 0.74 mg/dL (ref 0.44–1.00)
GFR, Estimated: >60 mL/min (ref >60)
Glucose, Bld: 108 mg/dL — ABNORMAL HIGH (ref 70–99)
Potassium: 4.2 mmol/L (ref 3.5–5.1)
Sodium: 140 mmol/L (ref 135–145)
Total Bilirubin: 0.5 mg/dL (ref 0.3–1.2)
Total Protein: 7.2 g/dL (ref 6.5–8.1)

## 2021-07-27 LAB — CBC WITH DIFFERENTIAL (CANCER CENTER ONLY)
Abs Immature Granulocytes: 0.01 10*3/uL (ref 0.00–0.07)
Basophils Absolute: 0 10*3/uL (ref 0.0–0.1)
Basophils Relative: 0 %
Eosinophils Absolute: 0.1 10*3/uL (ref 0.0–0.5)
Eosinophils Relative: 2 %
HCT: 39.9 % (ref 36.0–46.0)
Hemoglobin: 12.8 g/dL (ref 12.0–15.0)
Immature Granulocytes: 0 %
Lymphocytes Relative: 28 %
Lymphs Abs: 1.1 10*3/uL (ref 0.7–4.0)
MCH: 30.1 pg (ref 26.0–34.0)
MCHC: 32.1 g/dL (ref 30.0–36.0)
MCV: 93.9 fL (ref 80.0–100.0)
Monocytes Absolute: 0.3 10*3/uL (ref 0.1–1.0)
Monocytes Relative: 7 %
Neutro Abs: 2.4 10*3/uL (ref 1.7–7.7)
Neutrophils Relative %: 63 %
Platelet Count: 195 10*3/uL (ref 150–400)
RBC: 4.25 MIL/uL (ref 3.87–5.11)
RDW: 12.6 % (ref 11.5–15.5)
WBC Count: 3.8 10*3/uL — ABNORMAL LOW (ref 4.0–10.5)
nRBC: 0 % (ref 0.0–0.2)

## 2021-07-27 LAB — IRON AND IRON BINDING CAPACITY (CC-WL,HP ONLY)
Iron: 95 ug/dL (ref 28–170)
Saturation Ratios: 24 % (ref 10.4–31.8)
TIBC: 395 ug/dL (ref 250–450)
UIBC: 300 ug/dL (ref 148–442)

## 2021-07-27 LAB — FERRITIN: Ferritin: 63 ng/mL (ref 11–307)

## 2021-07-27 MED ORDER — ANASTROZOLE 1 MG PO TABS: ORAL_TABLET | ORAL | 3 refills | Status: DC

## 2021-07-27 MED ORDER — ALENDRONATE SODIUM 70 MG PO TABS: 70.0000 mg | ORAL_TABLET | ORAL | 3 refills | Status: DC

## 2021-07-27 NOTE — Progress Notes (Signed)
? ?Patient Care Team: ?Gildardo Pounds, NP as PCP - General (Nurse Practitioner) ?Stark Klein, MD as Consulting Physician (General Surgery) ?Magrinat, Virgie Dad, MD (Inactive) as Consulting Physician (Oncology) ?Gery Pray, MD as Consulting Physician (Radiation Oncology) ?Nicholas Lose, MD as Consulting Physician (Hematology and Oncology) ? ?DIAGNOSIS:  ?Encounter Diagnoses  ?Name Primary?  ? Malignant neoplasm of upper-inner quadrant of right breast in female, estrogen receptor positive ([place])   ? Iron deficiency anemia, unspecified iron deficiency anemia type   ? ? ?SUMMARY OF ONCOLOGIC HISTORY: ?Oncology History  ?Malignant neoplasm of upper-inner quadrant of right breast in female, estrogen receptor positive ([place])  ?07/03/2018 Initial Diagnosis  ? Patient felt a right breast lump, and followed up with diagnostic mammography a month later revealing a 2.7 x 1.7 x 1.9 cm mass in the right breast. Biopsy ([id number]) revealed a clinical T2 N0, stage IB invasive ductal carcinoma, grade 2, estrogen receptor strongly positive, progesterone receptor weakly positive, with no HER-2 amplification and an MIB-1 of 5%. ?  ?06/2018 - 06/2023 Anti-estrogen oral therapy  ? Anastrozole ?  ?07/24/2018 Genetic Testing  ? Negative genetic testing.  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  The Common Hereditary Gene Panel offered by [facility] includes sequencing and/or deletion duplication testing of the following 48 genes: APC, ATM, AXIN2, BARD1, BMPR1A, BRCA1, BRCA2, BRIP1, CDH1, CDK4, CDKN2A (p14ARF), CDKN2A (p16INK4a), CHEK2, CTNNA1, DICER1, EPCAM (Deletion/duplication testing only), GREM1 (promoter region deletion/duplication testing only), KIT, MEN1, MLH1, MSH2, MSH3, MSH6, MUTYH, NBN, NF1, NHTL1, PALB2, PDGFRA, PMS2, POLD1, POLE, PTEN, RAD50, RAD51C, RAD51D, RNF43, SDHB, SDHC, SDHD, SMAD4, SMARCA4. STK11, TP53, TSC1, TSC2, and VHL.  The following genes were evaluated for sequence changes only: SDHA and HOXB13 c.251G>A variant only. The  report date is Jul 24, 2018. ? ?UPDATE: BRCA1 c.5306A>G VUS was reclassified to Likely Benign as a result of re-review of the evidence in light of new variant interpretation guidelines and/or new information. The updated report date is April 23, 2019. ?  ?08/31/2018 Cancer Staging  ? Staging form: Breast, AJCC 8th Edition ?- Pathologic stage from 08/31/2018: Stage IB (pT2, pN55m, cM0, G2, ER+, PR+, HER2-) - Signed by CGardenia Phlegm NP on 02/26/2019 ? ?  ?08/31/2018 Surgery  ? Right lumpectomy and sentinel lymph node sampling (([id number] for a pT2 pN0, stage IB invasive ductal carcinoma, grade 2, with close but negative margins. 1/3 lymph nodes positive for carcinoma.  ?  ?08/31/2018 Oncotype testing  ? MammaPrint shows a luminal type low risk tumor, predicting a 97.8% chance of being alive without distant recurrence at 5 years with antiestrogen therapy alone [without chemotherapy] ?  ?08/31/2018 Oncotype testing  ? The Oncotype DX score was 21 predicting a risk of outside the breast recurrence over the next 9 years of 7% if the patient's only systemic therapy is tamoxifen for 5 years. ?  ?10/05/2018 - 11/22/2018 Radiation Therapy  ? The patient initially received a dose of 50.4 Gy in 28 fractions to the breast using whole-breast tangent fields and 45 Gy in 25 fractions to the axilla. This was delivered using a 3-D conformal technique. The pt received a boost delivering an additional 12 Gy in 6 fractions using a electron boost with 176m electrons. The total dose was 107.4 Gy.  ?  ? ? ?CHIEF COMPLIANT:  ? ?INTERVAL HISTORY: Emily Hendricks a ? ? ?ALLERGIES:  is allergic to lisinopril. ? ?MEDICATIONS:  ?Current Outpatient Medications  ?Medication Sig Dispense Refill  ? amitriptyline (ELAVIL) 25 MG tablet Take 1 tablet (  25 mg total) by mouth at bedtime. FOR HEADACHES 30 tablet 3  ? anastrozole (ARIMIDEX) 1 MG tablet TAKE 1 TABLET (1 MG TOTAL) BY MOUTH DAILY. MUST HAVE OFFICE VISIT FOR ADDITIONAL REFILLS 90  tablet 2  ? Cholecalciferol (VITAMIN D) 50 MCG (2000 UT) tablet Take 1 tablet (2,000 Units total) by mouth daily. 90 tablet 4  ? ferrous sulfate 325 (65 FE) MG tablet Take 1 tablet (325 mg total) by mouth daily with breakfast. 90 tablet 4  ? ibuprofen (ADVIL,MOTRIN) 600 MG tablet Take 1 tablet (600 mg total) by mouth every 8 (eight) hours as needed for headache. Take with food. 40 tablet 0  ? losartan (COZAAR) 50 MG tablet TAKE ONE TABLET BY MOUTH DAILY 90 tablet 0  ? metFORMIN (GLUCOPHAGE) 500 MG tablet TAKE ONE TABLET BY MOUTH TWICE A DAY WITH A MEAL 60 tablet 0  ? Multiple Vitamin (MULTIVITAMIN PO) Take 1 tablet by mouth daily at 6 (six) AM.    ? simvastatin (ZOCOR) 20 MG tablet Take 1 tablet (20 mg total) by mouth daily. 90 tablet 1  ? ?No current facility-administered medications for this visit.  ? ? ?PHYSICAL EXAMINATION: ?ECOG PERFORMANCE STATUS: 1 - Symptomatic but completely ambulatory ? ?Vitals:  ? 07/27/21 1118  ?BP: (!) 159/82  ?Pulse: 77  ?Resp: 18  ?Temp: (!) 97.5 ?F (36.4 ?C)  ?SpO2: 100%  ? ?Filed Weights  ? 07/27/21 1118  ?Weight: 123 lb (55.8 kg)  ? ? ?BREAST: No palpable masses or nodules in either right or left breasts. No palpable axillary supraclavicular or infraclavicular adenopathy no breast tenderness or nipple discharge. (exam performed in the presence of a chaperone) ? ?LABORATORY DATA:  ?I have reviewed the data as listed ? ?  Latest Ref Rng & Units 07/27/2021  ? 11:00 AM 02/09/2021  ?  2:10 PM 11/06/2020  ?  3:17 PM  ?CMP  ?Glucose 70 - 99 mg/dL 108   107   89    ?BUN 8 - 23 mg/dL _0 ?Creatinine 0.44 - 1.00 mg/dL 0.74   0.75   0.80    ?Sodium 135 - 145 mmol/L 140   140   142    ?Potassium 3.5 - 5.1 mmol/L 4.2   3.8   4.1    ?Chloride 98 - 111 mmol/L 106   106   108    ?CO2 22 - 32 mmol/L _1 ?Calcium 8.9 - 10.3 mg/dL 9.4   8.7   9.4    ?Total Protein 6.5 - 8.1 g/dL 7.2   6.9   7.3    ?Total Bilirubin 0.3 - 1.2 mg/dL 0.5   0.4   0.4    ?Alkaline Phos 38 - 126 U/L 76    87   108    ?AST 15 - 41 U/L _2 ?ALT 0 - 44 U/L _3 ? ? ?Lab Results  ?Component Value Date  ? WBC 3.8 (L) 07/27/2021  ? HGB 12.8 07/27/2021  ? HCT 39.9 07/27/2021  ? MCV 93.9 07/27/2021  ? PLT 195 07/27/2021  ? NEUTROABS 2.4 07/27/2021  ? ? ?ASSESSMENT & PLAN:  ?Malignant neoplasm of upper-inner quadrant of right breast in female, estrogen receptor positive ([place]) ?07/03/2018: T2N0 stage Ib grade 2 IDC ER positive, PR weakly positive, HER2 negative, Ki-67 5% ?  07/12/2018: Neoadjuvant anastrozole ?08/31/2018: Right lumpectomy: T2N0 stage Ib grade 2 IDC, negative margins, 0/3 lymph nodes negative, MammaPrint low risk, genetics: Negative ? ?Breast cancer surveillance: ?1.  Breast exam 07/27/2021: Benign ?2. mammogram 03/04/2021: Benign breast density category C ?Bone density: October 2021: Osteoporosis T score -3.1: I sent a prescription for Fosamax today and encouraged her to take calcium and vitamin D. ? ?Iron deficiency anemia ?02/09/2021: Hemoglobin 12, MCV 90.6, 12% iron saturation, ferritin 259 ?07/27/2021: Hemoglobin 12.8, MCV 93.9, TIBC 395, iron saturation 24%, CMP normal ?IV iron: June 2022 ?Recheck labs in 1 year and follow-up . ? ? ? ?No orders of the defined types were placed in this encounter. ? ?The patient has a good understanding of the overall plan. she agrees with it. she will call with any problems that may develop before the next visit here. ?Total time spent: 30 mins including face to face time and time spent for planning, charting and co-ordination of care ? ? Harriette Ohara, MD ?07/27/21 ? ? ? ?

## 2021-07-27 NOTE — Assessment & Plan Note (Signed)
07/03/2018: T2N0 stage Ib grade 2 IDC ER positive, PR weakly positive, HER2 negative, Ki-67 5% ?07/12/2018: Neoadjuvant anastrozole ?08/31/2018: Right lumpectomy: T2N0 stage Ib grade 2 IDC, negative margins, 0/3 lymph nodes negative, MammaPrint low risk, genetics: Negative ? ?Breast cancer surveillance: ?1.  Breast exam 07/27/2021: Benign ?2. mammogram 03/04/2021: Benign breast density category C ? ?

## 2021-07-27 NOTE — Assessment & Plan Note (Signed)
02/09/2021: Hemoglobin 12, MCV 90.6, 12% iron saturation, ferritin 259 ?07/27/2021: ?IV iron: June 2022 ?Recheck labs in 1 year and follow-up a week later for both breast cancer surveillance and follow iron deficiency anemia. ? ?

## 2021-08-04 ENCOUNTER — Encounter: Payer: Self-pay | Admitting: Nurse Practitioner

## 2021-08-04 ENCOUNTER — Telehealth (HOSPITAL_BASED_OUTPATIENT_CLINIC_OR_DEPARTMENT_OTHER): Payer: Medicaid Other | Admitting: Nurse Practitioner

## 2021-08-04 DIAGNOSIS — Q828 Other specified congenital malformations of skin: Secondary | ICD-10-CM

## 2021-08-04 NOTE — Progress Notes (Signed)
Virtual Visit via Telephone Note ? I discussed the limitations, risks, security and privacy concerns of performing an evaluation and management service by telephone and the availability of in person appointments. I also discussed with the patient that there may be a patient responsible charge related to this service. The patient expressed understanding and agreed to proceed.  ? ? ?I connected with Emily Hendricks on 08/04/21  at   3:50 PM EDT  EDT by telephone and verified that I am speaking with the correct person using two identifiers. ? ?Location of Patient: ?Private Residence ?  ?Location of Provider: ?Scientist, research (physical sciences) and [employer] Office  ?  ?Persons participating in Telemedicine visit: ?Emily Rankins FNP-BC ?Emily Hendricks  ? INTERPRETER: Daughter Emily Hendricks  ? ?History of Present Illness: ?Telemedicine visit for: POROKERATOSIS of the right foot ?Ms. Emily Hendricks is requesting a referral to the podiatrist for right foot pain. Pain is in the right plantar lateral foot. She was evaluated by podiatry several years ago and at that time physical exam revealed a ?Keratotic lesion with a central nucleated core noted, no webspace macerations, no ecchymosis ?Treatment options at that time included a local injection around the keratotic lesion then Parred keratoic lesion using a chisel blade; then the area was treated with Salinocaine and moleskin ?It was recommended that OP treatment would include:  ?-Rx Sal acid to use at bedtime  ?-Encouraged daily skin emollients ?-Encouraged use of pumice stone ?-Advised good supportive shoes and inserts ?-Patient to return to office in 1 month if no better may benefit from surgical excision of the lesion or sooner if condition worsens. ? ? ?Past Medical History:  ?Diagnosis Date  ? Breast cancer ([place]) 2020  ? mastectomy  ? Diabetes mellitus without complication ([place])   ? on meds  ? Family history of cancer   ? Hyperlipidemia   ? on meds  ? Hypertension   ? on meds  ? Personal  history of radiation therapy   ?  ?Past Surgical History:  ?Procedure Laterality Date  ? BREAST LUMPECTOMY    ? BREAST LUMPECTOMY WITH RADIOACTIVE SEED AND SENTINEL LYMPH NODE BIOPSY Right 08/31/2018  ? Procedure: RIGHT BREAST LUMPECTOMY WITH RADIOACTIVE SEED AND SENTINEL LYMPH NODE BIOPSY;  Surgeon: Stark Klein, MD;  Location: [place];  Service: General;  Laterality: Right;  ? COLONOSCOPY  2018  ? HD-MAC-supre(fair) with lavage-TA x 6  ? POLYPECTOMY  2018  ? TA x 6  ? TUBAL LIGATION    ?  ?Family History  ?Problem Relation Age of Onset  ? Cancer Mother 8  ?     unknown cancer, in her lower abdomen  ? Cancer Sister 43  ?     unknown cancer in her lower abdomen  ? Heart disease Brother   ? Ovarian cancer Maternal Grandmother   ? Cancer Niece   ?     unknown form of cancer  ? Colon polyps Neg Hx   ? Colon cancer Neg Hx   ? Esophageal cancer Neg Hx   ? Rectal cancer Neg Hx   ? Stomach cancer Neg Hx   ?  ?Social History  ? ?Socioeconomic History  ? Marital status: Married  ?  Spouse name: Not on file  ? Number of children: 2  ? Years of education: Not on file  ? Highest education level: 12th grade  ?Occupational History  ? Not on file  ?Tobacco Use  ? Smoking status: Never  ? Smokeless tobacco: Never  ?  Vaping Use  ? Vaping Use: Never used  ?Substance and Sexual Activity  ? Alcohol use: No  ? Drug use: No  ? Sexual activity: Not Currently  ?Other Topics Concern  ? Not on file  ?Social History Narrative  ? Not on file  ? ?Social Determinants of Health  ? ?Financial Resource Strain: Not on file  ?Food Insecurity: Not on file  ?Transportation Needs: Not on file  ?Physical Activity: Not on file  ?Stress: Not on file  ?Social Connections: Not on file  ?  ? ?Observations/Objective: ?Awake, alert and oriented x 3 ? ? ?Review of Systems  ?Constitutional:  Negative for fever, malaise/fatigue and weight loss.  ?HENT: Negative.  Negative for nosebleeds.   ?Eyes: Negative.  Negative for blurred vision, double  vision and photophobia.  ?Respiratory: Negative.  Negative for cough and shortness of breath.   ?Cardiovascular: Negative.  Negative for chest pain, palpitations and leg swelling.  ?Gastrointestinal: Negative.  Negative for heartburn, nausea and vomiting.  ?Musculoskeletal: Negative.  Negative for myalgias.  ?Skin:   ?     SEE HPI  ?Neurological: Negative.  Negative for dizziness, focal weakness, seizures and headaches.  ?Psychiatric/Behavioral: Negative.  Negative for suicidal ideas.    ?Assessment and Plan: ?Diagnoses and all orders for this visit: ? ?Porokeratosis ?-     Ambulatory referral to Podiatry ? ?  ? ?Follow Up Instructions ?No follow-ups on file.  ? ?  ?I discussed the assessment and treatment plan with the patient. The patient was provided an opportunity to ask questions and all were answered. The patient agreed with the plan and demonstrated an understanding of the instructions. ?  ?The patient was advised to call back or seek an in-person evaluation if the symptoms worsen or if the condition fails to improve as anticipated. ? ?I provided 11 minutes of non-face-to-face time during this encounter including median intraservice time, reviewing previous notes, labs, imaging, medications and explaining diagnosis and management. ? ?Gildardo Pounds, FNP-BC  ?

## 2021-08-06 ENCOUNTER — Encounter: Payer: Self-pay | Admitting: Oncology

## 2021-08-20 ENCOUNTER — Other Ambulatory Visit: Payer: Self-pay | Admitting: Nurse Practitioner

## 2021-08-20 DIAGNOSIS — I10 Essential (primary) hypertension: Secondary | ICD-10-CM

## 2021-08-27 ENCOUNTER — Other Ambulatory Visit: Payer: Self-pay | Admitting: *Deleted

## 2021-08-27 ENCOUNTER — Ambulatory Visit (INDEPENDENT_AMBULATORY_CARE_PROVIDER_SITE_OTHER): Payer: Medicaid Other | Admitting: Podiatry

## 2021-08-27 ENCOUNTER — Ambulatory Visit: Payer: Medicaid Other | Admitting: Podiatry

## 2021-08-27 ENCOUNTER — Encounter: Payer: Self-pay | Admitting: Podiatry

## 2021-08-27 DIAGNOSIS — M79671 Pain in right foot: Secondary | ICD-10-CM | POA: Diagnosis not present

## 2021-08-27 DIAGNOSIS — Q828 Other specified congenital malformations of skin: Secondary | ICD-10-CM

## 2021-08-27 NOTE — Progress Notes (Signed)
Subjective: DEVYNE Hendricks is a 66 y.o. female patient who presents to office for follow up evaluation of Right foot pain secondary to callus skin. Patient complains of pain at the lesion present Right plantar foot reports that it has been hurting more because she has to stand and do a lot of cooking for everyone in her household. Patient denies any other pedal complaints.   Patient is assisted by interpter who helps to report this history.     Patient Active Problem List   Diagnosis Date Noted   Essential hypertension 09/17/2020   Dyslipidemia 09/17/2020   Pre-diabetes 09/17/2020   Absolute anemia 08/12/2020   Iron deficiency anemia 08/12/2020   Genetic testing 07/24/2018   Family history of cancer    Malignant neoplasm of upper-inner quadrant of right breast in female, estrogen receptor positive ([place]) 07/06/2018    Current Outpatient Medications on File Prior to Visit  Medication Sig Dispense Refill   alendronate (FOSAMAX) 70 MG tablet Take 1 tablet (70 mg total) by mouth once a week. Take with a full glass of water on an empty stomach. 12 tablet 3   amitriptyline (ELAVIL) 25 MG tablet Take 1 tablet (25 mg total) by mouth at bedtime. FOR HEADACHES 30 tablet 3   anastrozole (ARIMIDEX) 1 MG tablet TAKE 1 TABLET (1 MG TOTAL) BY MOUTH DAILY. MUST HAVE OFFICE VISIT FOR ADDITIONAL REFILLS 90 tablet 3   Cholecalciferol (VITAMIN D) 50 MCG (2000 UT) tablet Take 1 tablet (2,000 Units total) by mouth daily. 90 tablet 4   ferrous sulfate 325 (65 FE) MG tablet Take 1 tablet (325 mg total) by mouth daily with breakfast. 90 tablet 4   ibuprofen (ADVIL,MOTRIN) 600 MG tablet Take 1 tablet (600 mg total) by mouth every 8 (eight) hours as needed for headache. Take with food. 40 tablet 0   losartan (COZAAR) 50 MG tablet TAKE ONE TABLET BY MOUTH DAILY 90 tablet 0   metFORMIN (GLUCOPHAGE) 500 MG tablet TAKE ONE TABLET BY MOUTH TWICE A DAY WITH A MEAL 60 tablet 0   Multiple Vitamin (MULTIVITAMIN PO) Take  1 tablet by mouth daily at 6 (six) AM.     simvastatin (ZOCOR) 20 MG tablet Take 1 tablet (20 mg total) by mouth daily. 90 tablet 1   No current facility-administered medications on file prior to visit.    Allergies  Allergen Reactions   Lisinopril Other (See Comments) and Cough    headaches    Objective:  General: Alert and oriented x3 in no acute distress  Dermatology: Keratotic lesion present plantar lateral and sub met 1 on right foot with skin lines transversing the lesions, pain is present with direct pressure to the lesions with a central nucleated core noted, no webspace macerations, no ecchymosis bilateral, all nails x 10 are well manicured.  Vascular: Dorsalis Pedis and Posterior Tibial pedal pulses 2/4, Capillary Fill Time 3 seconds, + pedal hair growth bilateral, no edema bilateral lower extremities, Temperature gradient within normal limits.  Neurology: Johney Maine sensation intact via light touch bilateral.  Musculoskeletal: Mild tenderness with palpation at the keratotic lesion sites on Right, Muscular strength 5/5 in all groups without pain or limitation on range of motion. No lower extremity muscular or boney deformity noted.  Assessment and Plan: Problem List Items Addressed This Visit   None   -Discussed corns and calluses with patient and treatment options.  -Hyperkeratotic tissue was debrided with chisel without incident.  -Applied salycylic acid treatment to area with dressing. Advised to  remove bandaging tomorrow.  -Encouraged daily moisturizing -Discussed use of pumice stone -Advised good supportive shoes and inserts -Patient to return to office as needed or sooner if condition worsens.   Lorenda Peck, DPM

## 2021-09-17 ENCOUNTER — Other Ambulatory Visit: Payer: Self-pay | Admitting: Nurse Practitioner

## 2021-09-17 DIAGNOSIS — E1165 Type 2 diabetes mellitus with hyperglycemia: Secondary | ICD-10-CM

## 2021-09-17 NOTE — Telephone Encounter (Signed)
Requested medication (s) are due for refill today: yes  Requested medication (s) are on the active medication list: yes  Last refill:  06/12/21 #60 0 refills  Future visit scheduled: no  Notes to clinic:  do you want to refill Rx #120 0 refills?     Requested Prescriptions  Pending Prescriptions Disp Refills   metFORMIN (GLUCOPHAGE) 500 MG tablet [Pharmacy Med Name: METFORMIN HCL 500MG TAB] 120 tablet 0    Sig: TAKE ONE TABLET BY MOUTH TWICE A DAY WITH MEALS     Endocrinology:  Diabetes - Biguanides Failed - 09/17/2021 12:51 PM      Failed - B12 Level in normal range and within 720 days    No results found for: "VITAMINB12"       Passed - Cr in normal range and within 360 days    Creatinine  Date Value Ref Range Status  07/27/2021 0.74 0.44 - 1.00 mg/dL Final   Creat  Date Value Ref Range Status  02/03/2016 0.78 0.50 - 0.99 mg/dL Final    Comment:      For patients > or = 67 years of age: The upper reference limit for Creatinine is approximately 13% higher for people identified as African-American.            Passed - HBA1C is between 0 and 7.9 and within 180 days    HbA1c, POC (prediabetic range)  Date Value Ref Range Status  09/20/2019 6.2 5.7 - 6.4 % Final   Hgb A1c MFr Bld  Date Value Ref Range Status  03/27/2021 6.0 (H) 4.8 - 5.6 % Final    Comment:             Prediabetes: 5.7 - 6.4          Diabetes: >6.4          Glycemic control for adults with diabetes: <7.0          Passed - eGFR in normal range and within 360 days    GFR, Est African American  Date Value Ref Range Status  02/03/2016 >89 >=60 mL/min Final   GFR, Est AFR Am  Date Value Ref Range Status  07/12/2018 >60 >60 mL/min Final   GFR calc Af Amer  Date Value Ref Range Status  07/03/2019 >60 >60 mL/min Final   GFR, Est Non African American  Date Value Ref Range Status  02/03/2016 82 >=60 mL/min Final   GFR, Estimated  Date Value Ref Range Status  07/27/2021 >60 >60 mL/min Final     Comment:    (NOTE) Calculated using the CKD-EPI Creatinine Equation (2021)          Passed - Valid encounter within last 6 months    Recent Outpatient Visits           1 month ago Porokeratosis   [place], Vernia Buff, NP   6 months ago Essential hypertension   [place], Vernia Buff, NP   9 months ago Essential hypertension   [place], Vernia Buff, NP   1 year ago Essential hypertension   [place], MD   1 year ago Essential hypertension   [place], [place] J, NP              Passed - CBC within normal limits and completed in the last 12 months  WBC  Date Value Ref Range Status  02/09/2021 5.1 4.0 - 10.5 K/uL Final   WBC Count  Date Value Ref Range Status  07/27/2021 3.8 (L) 4.0 - 10.5 K/uL Final   RBC  Date Value Ref Range Status  07/27/2021 4.25 3.87 - 5.11 MIL/uL Final   Hemoglobin  Date Value Ref Range Status  07/27/2021 12.8 12.0 - 15.0 g/dL Final  12/16/2020 13.7 11.1 - 15.9 g/dL Final   HCT  Date Value Ref Range Status  07/27/2021 39.9 36.0 - 46.0 % Final   Hematocrit  Date Value Ref Range Status  12/16/2020 42.6 34.0 - 46.6 % Final   MCHC  Date Value Ref Range Status  07/27/2021 32.1 30.0 - 36.0 g/dL Final   [facility]  Date Value Ref Range Status  07/27/2021 30.1 26.0 - 34.0 pg Final   MCV  Date Value Ref Range Status  07/27/2021 93.9 80.0 - 100.0 fL Final  12/16/2020 90 79 - 97 fL Final   No results found for: "PLTCOUNTKUC", "LABPLAT", "POCPLA" RDW  Date Value Ref Range Status  07/27/2021 12.6 11.5 - 15.5 % Final  12/16/2020 13.9 11.7 - 15.4 % Final

## 2021-10-08 ENCOUNTER — Other Ambulatory Visit: Payer: Self-pay | Admitting: Nurse Practitioner

## 2021-10-08 DIAGNOSIS — E785 Hyperlipidemia, unspecified: Secondary | ICD-10-CM

## 2021-11-10 ENCOUNTER — Encounter: Payer: Self-pay | Admitting: Nurse Practitioner

## 2021-11-10 ENCOUNTER — Telehealth (HOSPITAL_BASED_OUTPATIENT_CLINIC_OR_DEPARTMENT_OTHER): Payer: Medicaid Other | Admitting: Nurse Practitioner

## 2021-11-10 DIAGNOSIS — E785 Hyperlipidemia, unspecified: Secondary | ICD-10-CM | POA: Diagnosis not present

## 2021-11-10 DIAGNOSIS — I10 Essential (primary) hypertension: Secondary | ICD-10-CM

## 2021-11-10 DIAGNOSIS — E1165 Type 2 diabetes mellitus with hyperglycemia: Secondary | ICD-10-CM

## 2021-11-10 DIAGNOSIS — R519 Headache, unspecified: Secondary | ICD-10-CM | POA: Diagnosis not present

## 2021-11-10 DIAGNOSIS — Z1211 Encounter for screening for malignant neoplasm of colon: Secondary | ICD-10-CM | POA: Diagnosis not present

## 2021-11-10 DIAGNOSIS — D509 Iron deficiency anemia, unspecified: Secondary | ICD-10-CM | POA: Diagnosis not present

## 2021-11-10 MED ORDER — TOPIRAMATE 25 MG PO TABS: 25.0000 mg | ORAL_TABLET | Freq: Every day | ORAL | 6 refills | Status: DC

## 2021-11-10 NOTE — Progress Notes (Signed)
Virtual Visit Note  I discussed the limitations, risks, security and privacy concerns of performing an evaluation and management service by video and the availability of in person appointments. I also discussed with the patient that there may be a patient responsible charge related to this service. The patient expressed understanding and agreed to proceed.    I connected with Emily Hendricks on 11/10/21  at   4:10 PM EDT  EDT by VIDEO and verified that I am speaking with the correct person using two identifiers.   Location of Patient: Private Residence   Location of Provider: [place] [facility] [employer] Office    Persons participating in VIRTUAL visit: [name] Emily Hendricks   Daughter Emily Hendricks translating   History of Present Illness: VIRTUAL visit for: Frontal headache  She has a past medical history of Breast cancer (2020), DM 2, Hyperlipidemia, Hypertension, and Personal history of radiation therapy.  Headaches Endorses frontal headaches occurring several times a week. Headaches are chronic. She was prescribed amitriptyline last year however she never picked this up. Due to beers criteria I will prescribe topamax instead. She has tried OTC ibuprofen, herbal teas with no relief. Blood pressure is not well controlled. Will likely need to increase valsartan at next office visit. She does not monitor her blood pressure at home.  BP Readings from Last 3 Encounters:  07/27/21 (!) 159/82  12/16/20 (!) 164/99  11/06/20 (!) 163/82    Her daughter Emily Hendricks is requesting to drop off immigration forms. I have instructed her that I can not fill these forms out as they can not be signed by advanced practice providers. I will work with her to attempt to locate a physician who can assist her with this for her mother  Past Medical History:  Diagnosis Date   Breast cancer ([place]) 2020   mastectomy   Diabetes mellitus without complication ([place])    on meds   Family  history of cancer    Hyperlipidemia    on meds   Hypertension    on meds   Personal history of radiation therapy     Past Surgical History:  Procedure Laterality Date   BREAST LUMPECTOMY     BREAST LUMPECTOMY WITH RADIOACTIVE SEED AND SENTINEL LYMPH NODE BIOPSY Right 08/31/2018   Procedure: RIGHT BREAST LUMPECTOMY WITH RADIOACTIVE SEED AND SENTINEL LYMPH NODE BIOPSY;  Surgeon: Stark Klein, MD;  Location: [facility];  Service: General;  Laterality: Right;   COLONOSCOPY  2018   HD-MAC-supre(fair) with lavage-TA x 6   POLYPECTOMY  2018   TA x 6   TUBAL LIGATION      Family History  Problem Relation Age of Onset   Cancer Mother 9       unknown cancer, in her lower abdomen   Cancer Sister 12       unknown cancer in her lower abdomen   Heart disease Brother    Ovarian cancer Maternal Grandmother    Cancer Niece        unknown form of cancer   Colon polyps Neg Hx    Colon cancer Neg Hx    Esophageal cancer Neg Hx    Rectal cancer Neg Hx    Stomach cancer Neg Hx     Social History   Socioeconomic History   Marital status: Married    Spouse name: Not on file   Number of children: 2   Years of education: Not on file   Highest education level:  12th grade  Occupational History   Not on file  Tobacco Use   Smoking status: Never   Smokeless tobacco: Never  Vaping Use   Vaping Use: Never used  Substance and Sexual Activity   Alcohol use: No   Drug use: No   Sexual activity: Not Currently  Other Topics Concern   Not on file  Social History Narrative   Not on file   Social Determinants of Health   Financial Resource Strain: Not on file  Food Insecurity: Not on file  Transportation Needs: No Transportation Needs (06/29/2018)   PRAPARE - Hydrologist (Medical): No    Lack of Transportation (Non-Medical): No  Physical Activity: Not on file  Stress: Not on file  Social Connections: Not on file      Observations/Objective: Awake, alert and oriented x 3   Review of Systems  Constitutional:  Negative for fever, malaise/fatigue and weight loss.  HENT: Negative.  Negative for nosebleeds.   Eyes: Negative.  Negative for blurred vision, double vision and photophobia.  Respiratory: Negative.  Negative for cough and shortness of breath.   Cardiovascular: Negative.  Negative for chest pain, palpitations and leg swelling.  Gastrointestinal: Negative.  Negative for heartburn, nausea and vomiting.  Musculoskeletal: Negative.  Negative for myalgias.  Neurological:  Positive for headaches. Negative for dizziness, focal weakness and seizures.  Psychiatric/Behavioral: Negative.  Negative for suicidal ideas.     Assessment and Plan: Diagnoses and all orders for this visit:  Frontal headache -     topiramate (TOPAMAX) 25 MG tablet; Take 1 tablet (25 mg total) by mouth daily. For headaches  Colon cancer screening -     Ambulatory referral to Gastroenterology  Essential hypertension -     CMP14+EGFR; Future  Type 2 diabetes mellitus with hyperglycemia, without long-term current use of insulin (HCC) -     Hemoglobin A1c; Future -     CMP14+EGFR; Future  Dyslipidemia -     Lipid panel; Future  Iron deficiency anemia, unspecified iron deficiency anemia type -     CBC with Differential; Future     Follow Up Instructions Return in about 3 months (around 02/10/2022).     I discussed the assessment and treatment plan with the patient. The patient was provided an opportunity to ask questions and all were answered. The patient agreed with the plan and demonstrated an understanding of the instructions.   The patient was advised to call back or seek an in-person evaluation if the symptoms worsen or if the condition fails to improve as anticipated.  I provided 12 minutes of face-to-face time during this encounter including median intraservice time, reviewing previous notes, labs, imaging,  medications and explaining diagnosis and management.  Gildardo Pounds, FNP-BC

## 2021-12-11 ENCOUNTER — Other Ambulatory Visit: Payer: Self-pay | Admitting: Physician Assistant

## 2021-12-11 DIAGNOSIS — E1165 Type 2 diabetes mellitus with hyperglycemia: Secondary | ICD-10-CM

## 2021-12-11 NOTE — Telephone Encounter (Signed)
Requested Prescriptions  Pending Prescriptions Disp Refills  . metFORMIN (GLUCOPHAGE) 500 MG tablet [Pharmacy Med Name: METFORMIN HCL 500MG TAB] 180 tablet 0    Sig: TAKE ONE TABLET BY MOUTH TWICE A DAY WITH MEALS     Endocrinology:  Diabetes - Biguanides Failed - 12/11/2021  1:23 PM      Failed - HBA1C is between 0 and 7.9 and within 180 days    HbA1c, POC (prediabetic range)  Date Value Ref Range Status  09/20/2019 6.2 5.7 - 6.4 % Final   Hgb A1c MFr Bld  Date Value Ref Range Status  03/27/2021 6.0 (H) 4.8 - 5.6 % Final    Comment:             Prediabetes: 5.7 - 6.4          Diabetes: >6.4          Glycemic control for adults with diabetes: <7.0          Failed - B12 Level in normal range and within 720 days    No results found for: "VITAMINB12"       Passed - Cr in normal range and within 360 days    Creatinine  Date Value Ref Range Status  07/27/2021 0.74 0.44 - 1.00 mg/dL Final   Creat  Date Value Ref Range Status  02/03/2016 0.78 0.50 - 0.99 mg/dL Final    Comment:      For patients > or = 67 years of age: The upper reference limit for Creatinine is approximately 13% higher for people identified as African-American.            Passed - eGFR in normal range and within 360 days    GFR, Est African American  Date Value Ref Range Status  02/03/2016 >89 >=60 mL/min Final   GFR, Est AFR Am  Date Value Ref Range Status  07/12/2018 >60 >60 mL/min Final   GFR calc Af Amer  Date Value Ref Range Status  07/03/2019 >60 >60 mL/min Final   GFR, Est Non African American  Date Value Ref Range Status  02/03/2016 82 >=60 mL/min Final   GFR, Estimated  Date Value Ref Range Status  07/27/2021 >60 >60 mL/min Final    Comment:    (NOTE) Calculated using the CKD-EPI Creatinine Equation (2021)          Passed - Valid encounter within last 6 months    Recent Outpatient Visits          1 month ago Frontal headache   [place],  Vernia Buff, NP   4 months ago Porokeratosis   [place], Vernia Buff, NP   8 months ago Essential hypertension   [place], Vernia Buff, NP   12 months ago Essential hypertension   [place], Vernia Buff, NP   1 year ago Essential hypertension   [place], MD      Future Appointments            In 3 weeks Gildardo Pounds, NP [place] - CBC within normal limits and completed in the last 12 months    WBC  Date Value Ref Range Status  02/09/2021 5.1 4.0 - 10.5 K/uL Final   WBC Count  Date Value  Ref Range Status  07/27/2021 3.8 (L) 4.0 - 10.5 K/uL Final   RBC  Date Value Ref Range Status  07/27/2021 4.25 3.87 - 5.11 MIL/uL Final   Hemoglobin  Date Value Ref Range Status  07/27/2021 12.8 12.0 - 15.0 g/dL Final  12/16/2020 13.7 11.1 - 15.9 g/dL Final   HCT  Date Value Ref Range Status  07/27/2021 39.9 36.0 - 46.0 % Final   Hematocrit  Date Value Ref Range Status  12/16/2020 42.6 34.0 - 46.6 % Final   MCHC  Date Value Ref Range Status  07/27/2021 32.1 30.0 - 36.0 g/dL Final   [facility]  Date Value Ref Range Status  07/27/2021 30.1 26.0 - 34.0 pg Final   MCV  Date Value Ref Range Status  07/27/2021 93.9 80.0 - 100.0 fL Final  12/16/2020 90 79 - 97 fL Final   No results found for: "PLTCOUNTKUC", "LABPLAT", "POCPLA" RDW  Date Value Ref Range Status  07/27/2021 12.6 11.5 - 15.5 % Final  12/16/2020 13.9 11.7 - 15.4 % Final

## 2021-12-17 ENCOUNTER — Other Ambulatory Visit: Payer: Self-pay | Admitting: Nurse Practitioner

## 2021-12-17 DIAGNOSIS — E785 Hyperlipidemia, unspecified: Secondary | ICD-10-CM

## 2021-12-17 NOTE — Telephone Encounter (Signed)
Requested medications are due for refill today.  yes  Requested medications are on the active medications list.  yes  Last refill. 10/08/2021 #90 0 rf  Future visit scheduled.   yes  Notes to clinic.  Labs are expired    Requested Prescriptions  Pending Prescriptions Disp Refills   simvastatin (ZOCOR) 20 MG tablet [Pharmacy Med Name: SIMVASTATIN '20MG'$  TAB] 90 tablet 1    Sig: TAKE ONE TABLET BY MOUTH DAILY     Cardiovascular:  Antilipid - Statins Failed - 12/17/2021  4:22 PM      Failed - Lipid Panel in normal range within the last 12 months    Cholesterol, Total  Date Value Ref Range Status  12/16/2020 179 100 - 199 mg/dL Final   LDL Chol Calc (NIH)  Date Value Ref Range Status  12/16/2020 100 (H) 0 - 99 mg/dL Final   HDL  Date Value Ref Range Status  12/16/2020 58 >39 mg/dL Final   Triglycerides  Date Value Ref Range Status  12/16/2020 119 0 - 149 mg/dL Final         Passed - Patient is not pregnant      Passed - Valid encounter within last 12 months    Recent Outpatient Visits           1 month ago Frontal headache   [place], Vernia Buff, NP   4 months ago Porokeratosis   [place], Vernia Buff, NP   9 months ago Essential hypertension   [place], Vernia Buff, NP   1 year ago Essential hypertension   [place], Vernia Buff, NP   1 year ago Essential hypertension   [place] Antony Blackbird, MD       Future Appointments             In 2 weeks Gildardo Pounds, NP [place]

## 2022-01-01 ENCOUNTER — Ambulatory Visit: Payer: Medicaid Other | Admitting: Nurse Practitioner

## 2022-01-06 ENCOUNTER — Other Ambulatory Visit: Payer: Self-pay | Admitting: Nurse Practitioner

## 2022-01-06 DIAGNOSIS — I10 Essential (primary) hypertension: Secondary | ICD-10-CM

## 2022-01-27 ENCOUNTER — Ambulatory Visit (INDEPENDENT_AMBULATORY_CARE_PROVIDER_SITE_OTHER): Payer: Medicaid Other | Admitting: Family Medicine

## 2022-01-27 DIAGNOSIS — Z91199 Patient's noncompliance with other medical treatment and regimen due to unspecified reason: Secondary | ICD-10-CM

## 2022-01-27 NOTE — Progress Notes (Signed)
No show

## 2022-02-08 ENCOUNTER — Other Ambulatory Visit: Payer: Self-pay | Admitting: Nurse Practitioner

## 2022-02-08 DIAGNOSIS — I10 Essential (primary) hypertension: Secondary | ICD-10-CM

## 2022-02-10 ENCOUNTER — Ambulatory Visit: Payer: Medicaid Other | Admitting: Nurse Practitioner

## 2022-02-10 ENCOUNTER — Telehealth (HOSPITAL_BASED_OUTPATIENT_CLINIC_OR_DEPARTMENT_OTHER): Payer: Medicaid Other | Admitting: Nurse Practitioner

## 2022-02-10 ENCOUNTER — Ambulatory Visit: Payer: Medicaid Other | Attending: Nurse Practitioner

## 2022-02-10 ENCOUNTER — Encounter: Payer: Self-pay | Admitting: Nurse Practitioner

## 2022-02-10 DIAGNOSIS — E1165 Type 2 diabetes mellitus with hyperglycemia: Secondary | ICD-10-CM

## 2022-02-10 DIAGNOSIS — I10 Essential (primary) hypertension: Secondary | ICD-10-CM

## 2022-02-10 DIAGNOSIS — E785 Hyperlipidemia, unspecified: Secondary | ICD-10-CM

## 2022-02-10 DIAGNOSIS — D509 Iron deficiency anemia, unspecified: Secondary | ICD-10-CM

## 2022-02-10 DIAGNOSIS — R7303 Prediabetes: Secondary | ICD-10-CM | POA: Diagnosis not present

## 2022-02-10 MED ORDER — SIMVASTATIN 20 MG PO TABS: 20.0000 mg | ORAL_TABLET | Freq: Every day | ORAL | 1 refills | Status: DC

## 2022-02-10 MED ORDER — METFORMIN HCL 500 MG PO TABS: 500.0000 mg | ORAL_TABLET | Freq: Two times a day (BID) | ORAL | 1 refills | Status: DC

## 2022-02-10 MED ORDER — LOSARTAN POTASSIUM 50 MG PO TABS: 50.0000 mg | ORAL_TABLET | Freq: Every day | ORAL | 1 refills | Status: DC

## 2022-02-10 NOTE — Telephone Encounter (Signed)
Copied from [place] [id number]. Topic: Appointment Scheduling - Scheduling Inquiry for Clinic >> Feb 10, 2022 10:40 AM [place] B wrote: Reason for CRM:  Patient's daughter Alto Denver called in, signed in for video appt at 10"30 , but not no one on for appt. Please cal back

## 2022-02-10 NOTE — Telephone Encounter (Signed)
Copied from [place] [id number]. Topic: Appointment Scheduling - Scheduling Inquiry for Clinic >> Feb 10, 2022 10:40 AM [place] B wrote: Reason for CRM:  Patient's daughter Alto [place] called in, signed in for video appt at 10"30 , but not no one on for appt. Please cal back

## 2022-02-10 NOTE — Progress Notes (Signed)
Virtual Visit Note  I discussed the limitations, risks, security and privacy concerns of performing an evaluation and management service by video and the availability of in person appointments. I also discussed with the patient that there may be a patient responsible charge related to this service. The patient expressed understanding and agreed to proceed.    I connected with Emily Hendricks on 02/10/22  at  10:30 AM EST  EDT by VIDEO and verified that I am speaking with the correct person using two identifiers.   Location of Patient: Private Residence   [facility] of Provider: [facility] [place] participating in VIRTUAL visit: [name] [place] Daughter DPR   History of Present Illness: VIRTUAL visit for: Medication refills.   She has a past medical history of Breast cancer (2020), DM 2, Hyperlipidemia, Hypertension, and Personal history of radiation therapy.   DM 2 Well controlled with metformin 500 mg BID. LDL at goal with zocor 25 mg daily.  Lab Results  Component Value Date   HGBA1C 6.0 (H) 03/27/2021   Lab Results  Component Value Date   LDLCALC 100 (H) 12/16/2020      HTN Blood pressure readings elevated. I have requested her daughter start monitoring her blood pressures at home again so we can ascertain if medication changes are warranted. She is currently taking losartan 50 mg daily as prescribed.  BP Readings from Last 3 Encounters:  07/27/21 (!) 159/82  12/16/20 (!) 164/99  11/06/20 (!) 163/82     Past Medical History:  Diagnosis Date   Breast cancer ([place]) 2020   mastectomy   Diabetes mellitus without complication ([place])    on meds   Family history of cancer    Hyperlipidemia    on meds   Hypertension    on meds   Personal history of radiation therapy     Past Surgical History:  Procedure Laterality Date   BREAST LUMPECTOMY     BREAST LUMPECTOMY WITH RADIOACTIVE SEED AND  SENTINEL LYMPH NODE BIOPSY Right 08/31/2018   Procedure: RIGHT BREAST LUMPECTOMY WITH RADIOACTIVE SEED AND SENTINEL LYMPH NODE BIOPSY;  Surgeon: Stark Klein, MD;  Location: [place];  Service: General;  Laterality: Right;   COLONOSCOPY  2018   HD-MAC-supre(fair) with lavage-TA x 6   POLYPECTOMY  2018   TA x 6   TUBAL LIGATION      Family History  Problem Relation Age of Onset   Cancer Mother 3       unknown cancer, in her lower abdomen   Cancer Sister 27       unknown cancer in her lower abdomen   Heart disease Brother    Ovarian cancer Maternal Grandmother    Cancer Niece        unknown form of cancer   Colon polyps Neg Hx    Colon cancer Neg Hx    Esophageal cancer Neg Hx    Rectal cancer Neg Hx    Stomach cancer Neg Hx     Social History   Socioeconomic History   Marital status: Married    Spouse name: Not on file   Number of children: 2   Years of education: Not on file   Highest education level: 12th grade  Occupational History   Not on file  Tobacco Use   Smoking status: Never   Smokeless tobacco: Never  Vaping Use   Vaping Use: Never used  Substance  and Sexual Activity   Alcohol use: No   Drug use: No   Sexual activity: Not Currently  Other Topics Concern   Not on file  Social History Narrative   Not on file   Social Determinants of Health   Financial Resource Strain: Not on file  Food Insecurity: Not on file  Transportation Needs: No Transportation Needs (06/29/2018)   PRAPARE - Hydrologist (Medical): No    Lack of Transportation (Non-Medical): No  Physical Activity: Not on file  Stress: Not on file  Social Connections: Not on file     Observations/Objective: Awake, alert and oriented x 3   Review of Systems  Constitutional:  Negative for fever, malaise/fatigue and weight loss.  HENT: Negative.  Negative for nosebleeds.   Eyes: Negative.  Negative for blurred vision, double vision and  photophobia.  Respiratory: Negative.  Negative for cough and shortness of breath.   Cardiovascular: Negative.  Negative for chest pain, palpitations and leg swelling.  Gastrointestinal: Negative.  Negative for heartburn, nausea and vomiting.  Musculoskeletal: Negative.  Negative for myalgias.  Neurological: Negative.  Negative for dizziness, focal weakness, seizures and headaches.  Psychiatric/Behavioral: Negative.  Negative for suicidal ideas.     Assessment and Plan: Diagnoses and all orders for this visit:  Type 2 diabetes mellitus with hyperglycemia, without long-term current use of insulin (HCC) -     CMP14+EGFR -     Hemoglobin A1c -     metFORMIN (GLUCOPHAGE) 500 MG tablet; Take 1 tablet (500 mg total) by mouth 2 (two) times daily with a meal. Continue blood sugar control as discussed in office today, low carbohydrate diet, and regular physical exercise as tolerated, 150 minutes per week (30 min each day, 5 days per week, or 50 min 3 days per week). Keep blood sugar logs with fasting goal of 90-130 mg/dl, post prandial (after you eat) less than 180.  For Hypoglycemia: BS <60 and Hyperglycemia BS >400; contact the clinic ASAP. Annual eye exams and foot exams are recommended.   Dyslipidemia, goal LDL below 100 -     simvastatin (ZOCOR) 20 MG tablet; Take 1 tablet (20 mg total) by mouth daily. INSTRUCTIONS: Work on a low fat, heart healthy diet and participate in regular aerobic exercise program by working out at least 150 minutes per week; 5 days a week-30 minutes per day. Avoid red meat/beef/steak,  fried foods. junk foods, sodas, sugary drinks, unhealthy snacking, alcohol and smoking.  Drink at least 80 oz of water per day and monitor your carbohydrate intake daily.    Essential hypertension -     CMP14+EGFR -     losartan (COZAAR) 50 MG tablet; Take 1 tablet (50 mg total) by mouth daily. Continue all antihypertensives as prescribed.  Reminded to bring in blood pressure log for  follow  up appointment.  RECOMMENDATIONS: DASH/Mediterranean Diets are healthier choices for HTN.    Iron deficiency anemia, unspecified iron deficiency anemia type -     CBC with Differential     Follow Up Instructions Return in about 3 months (around 05/13/2022).     I discussed the assessment and treatment plan with the patient. The patient was provided an opportunity to ask questions and all were answered. The patient agreed with the plan and demonstrated an understanding of the instructions.   The patient was advised to call back or seek an in-person evaluation if the symptoms worsen or if the condition fails to improve as anticipated.  I provided 11 minutes of face-to-face time during this encounter including median intraservice time, reviewing previous notes, labs, imaging, medications and explaining diagnosis and management.  Gildardo Pounds, FNP-BC

## 2022-02-11 LAB — CBC WITH DIFFERENTIAL/PLATELET
Basophils Absolute: 0 10*3/uL (ref 0.0–0.2)
Basos: 1 %
EOS (ABSOLUTE): 0.1 10*3/uL (ref 0.0–0.4)
Eos: 2 %
Hematocrit: 39.3 % (ref 34.0–46.6)
Hemoglobin: 13.1 g/dL (ref 11.1–15.9)
Immature Grans (Abs): 0 10*3/uL (ref 0.0–0.1)
Immature Granulocytes: 0 %
Lymphocytes Absolute: 1.5 10*3/uL (ref 0.7–3.1)
Lymphs: 37 %
MCH: 30 pg (ref 26.6–33.0)
MCHC: 33.3 g/dL (ref 31.5–35.7)
MCV: 90 fL (ref 79–97)
Monocytes Absolute: 0.3 10*3/uL (ref 0.1–0.9)
Monocytes: 7 %
Neutrophils Absolute: 2.2 10*3/uL (ref 1.4–7.0)
Neutrophils: 53 %
Platelets: 203 10*3/uL (ref 150–450)
RBC: 4.37 x10E6/uL (ref 3.77–5.28)
RDW: 12.1 % (ref 11.7–15.4)
WBC: 4.1 10*3/uL (ref 3.4–10.8)

## 2022-02-11 LAB — CMP14+EGFR
ALT: 13 IU/L (ref 0–32)
AST: 18 IU/L (ref 0–40)
Albumin/Globulin Ratio: 2 (ref 1.2–2.2)
Albumin: 4.6 g/dL (ref 3.9–4.9)
Alkaline Phosphatase: 85 IU/L (ref 44–121)
BUN/Creatinine Ratio: 23 (ref 12–28)
BUN: 16 mg/dL (ref 8–27)
Bilirubin Total: 0.3 mg/dL (ref 0.0–1.2)
CO2: 26 mmol/L (ref 20–29)
Calcium: 9.6 mg/dL (ref 8.7–10.3)
Chloride: 104 mmol/L (ref 96–106)
Creatinine, Ser: 0.7 mg/dL (ref 0.57–1.00)
Globulin, Total: 2.3 g/dL (ref 1.5–4.5)
Glucose: 123 mg/dL — ABNORMAL HIGH (ref 70–99)
Potassium: 4 mmol/L (ref 3.5–5.2)
Sodium: 141 mmol/L (ref 134–144)
Total Protein: 6.9 g/dL (ref 6.0–8.5)
eGFR: 95 mL/min/{1.73_m2} (ref >59)

## 2022-02-11 LAB — HEMOGLOBIN A1C
Est. average glucose Bld gHb Est-mCnc: 123 mg/dL
Hgb A1c MFr Bld: 5.9 % — ABNORMAL HIGH (ref 4.8–5.6)

## 2022-02-16 ENCOUNTER — Ambulatory Visit: Payer: Medicaid Other | Admitting: Nurse Practitioner

## 2022-02-16 NOTE — Telephone Encounter (Signed)
Copied from [place] [phone]. Topic: Appointment Scheduling - Scheduling Inquiry for Clinic >> Feb 10, 2022 10:40 AM [place] B wrote: Reason for CRM:  Patient's daughter Alto Denver called in, signed in for video appt at 10"30 , but not no one on for appt. Please cal back

## 2022-02-22 ENCOUNTER — Other Ambulatory Visit: Payer: Self-pay | Admitting: Nurse Practitioner

## 2022-02-22 DIAGNOSIS — Z1231 Encounter for screening mammogram for malignant neoplasm of breast: Secondary | ICD-10-CM

## 2022-03-17 ENCOUNTER — Encounter: Payer: Self-pay | Admitting: Medical-Surgical

## 2022-03-17 ENCOUNTER — Ambulatory Visit (INDEPENDENT_AMBULATORY_CARE_PROVIDER_SITE_OTHER): Payer: Medicaid Other | Admitting: Medical-Surgical

## 2022-03-17 VITALS — BP 123/81 | HR 82 | Resp 20 | Ht 62.0 in | Wt 127.3 lb

## 2022-03-17 DIAGNOSIS — G8929 Other chronic pain: Secondary | ICD-10-CM

## 2022-03-17 DIAGNOSIS — E785 Hyperlipidemia, unspecified: Secondary | ICD-10-CM | POA: Diagnosis not present

## 2022-03-17 DIAGNOSIS — H7291 Unspecified perforation of tympanic membrane, right ear: Secondary | ICD-10-CM | POA: Diagnosis not present

## 2022-03-17 DIAGNOSIS — M810 Age-related osteoporosis without current pathological fracture: Secondary | ICD-10-CM | POA: Diagnosis not present

## 2022-03-17 DIAGNOSIS — M25561 Pain in right knee: Secondary | ICD-10-CM | POA: Diagnosis not present

## 2022-03-17 DIAGNOSIS — Z7689 Persons encountering health services in other specified circumstances: Secondary | ICD-10-CM

## 2022-03-17 DIAGNOSIS — D509 Iron deficiency anemia, unspecified: Secondary | ICD-10-CM

## 2022-03-17 DIAGNOSIS — I10 Essential (primary) hypertension: Secondary | ICD-10-CM | POA: Diagnosis not present

## 2022-03-17 DIAGNOSIS — H6122 Impacted cerumen, left ear: Secondary | ICD-10-CM | POA: Diagnosis not present

## 2022-03-17 DIAGNOSIS — E119 Type 2 diabetes mellitus without complications: Secondary | ICD-10-CM

## 2022-03-17 DIAGNOSIS — Z636 Dependent relative needing care at home: Secondary | ICD-10-CM

## 2022-03-17 DIAGNOSIS — M1711 Unilateral primary osteoarthritis, right knee: Secondary | ICD-10-CM | POA: Insufficient documentation

## 2022-03-17 MED ORDER — SERTRALINE HCL 25 MG PO TABS: ORAL_TABLET | ORAL | 0 refills | Status: DC

## 2022-03-17 NOTE — Progress Notes (Signed)
New Patient Office Visit  Subjective:  Patient ID: Emily Hendricks, female    DOB: 1955-04-23  Age: 66 y.o. MRN: [id number]  CC:  Chief Complaint  Patient presents with   Establish Care     HPI Emily Hendricks presents to establish care. She is a very pleasant 66 year old female who is accompanied by her daughter who serves as primary historian and interpreter. Professional interpreter on the premises but patient and daughter preferred to continue the appointment without them.   Has a history of type two diabetes and is currently taking metformin 500 mg twice daily with meals.  Tolerating the medication well without side effects.  Not checking blood sugars at home.  Works to follow a diabetic diet however she is the primary caregiver for her husband and is not always studios about self-care.  Not currently exercising.  Some recent weight loss however she has regained about 4 pounds in the last few months.  Reports being up-to-date on diabetic preventative care.  Hypertension: Taking losartan 50 mg daily, tolerating well without side effects.  Does not check blood pressure at home.  Limits dietary sodium whenever possible. Denies CP, SOB, palpitations, lower extremity edema, dizziness, headaches, or vision changes.  Hyperlipidemia: Taking Zocor 20 mg daily, tolerating well without side effects.  Eating for a low-fat heart healthy diet.  Osteoporosis: Taking Fosamax 70 mg weekly, tolerating well.  Last DEXA scan in 12/2019 with T-score of -3.1.  Iron deficiency: Followed by hematology once yearly now.  She is on oral iron supplements, tolerating them well with no significant GI upset.  Daughter reports that she has been very hard of hearing over the past few weeks and would like to have her ears checked today.  Patient admits to using Q-tips every few days to her bilateral ears.  Over the last few weeks, has had intermittent episodes of serosanguineous drainage from the right ear but  no ear pain.  Tends to ask people to repeat what they said several times and then repeats it back to them for confirmation.  Caregiver stress: Is the primary caretaker for her husband as noted above.  He had a stroke and is somewhat disabled.  She admits to being on edge and anxious a lot.  Daughter notes that she often has periods where she panics.  Not currently on any medications or doing any counseling.  Right knee pain: Has had intermittent chronic right knee pain for greater than 1 year.  Notes that the right is worse than the left and tends to bother her worse in the evening and at night.  When it does bother her, she takes ibuprofen 400 mg which is helpful.  Past Medical History:  Diagnosis Date   Breast cancer ([place]) 2020   mastectomy   Diabetes mellitus without complication ([place])    on meds   Family history of cancer    Hyperlipidemia    on meds   Hypertension    on meds   Personal history of radiation therapy     Past Surgical History:  Procedure Laterality Date   BREAST LUMPECTOMY     BREAST LUMPECTOMY WITH RADIOACTIVE SEED AND SENTINEL LYMPH NODE BIOPSY Right 08/31/2018   Procedure: RIGHT BREAST LUMPECTOMY WITH RADIOACTIVE SEED AND SENTINEL LYMPH NODE BIOPSY;  Surgeon: Stark Klein, MD;  Location: [facility];  Service: General;  Laterality: Right;   COLONOSCOPY  2018   HD-MAC-supre(fair) with lavage-TA x 6   POLYPECTOMY  2018  TA x 6   TUBAL LIGATION      Family History  Problem Relation Age of Onset   Cancer Mother 81       unknown cancer, in her lower abdomen   Cancer Sister 82       unknown cancer in her lower abdomen   Heart disease Brother    Ovarian cancer Maternal Grandmother    Cancer Niece        unknown form of cancer   Colon polyps Neg Hx    Colon cancer Neg Hx    Esophageal cancer Neg Hx    Rectal cancer Neg Hx    Stomach cancer Neg Hx     Social History   Socioeconomic History   Marital status: Married    Spouse name: Not  on file   Number of children: 2   Years of education: Not on file   Highest education level: 12th grade  Occupational History   Not on file  Tobacco Use   Smoking status: Never   Smokeless tobacco: Never  Vaping Use   Vaping Use: Never used  Substance and Sexual Activity   Alcohol use: No   Drug use: No   Sexual activity: Not Currently  Other Topics Concern   Not on file  Social History Narrative   Not on file   Social Determinants of Health   Financial Resource Strain: Not on file  Food Insecurity: Not on file  Transportation Needs: No Transportation Needs (06/29/2018)   PRAPARE - Hydrologist (Medical): No    Lack of Transportation (Non-Medical): No  Physical Activity: Not on file  Stress: Not on file  Social Connections: Not on file  Intimate Partner Violence: Not on file    ROS Review of Systems  Constitutional:  Negative for chills, fatigue, fever and unexpected weight change.  HENT:  Positive for hearing loss. Negative for congestion, rhinorrhea, sinus pressure and sore throat.   Respiratory:  Negative for cough, chest tightness and shortness of breath.   Cardiovascular:  Negative for chest pain, palpitations and leg swelling.  Gastrointestinal:  Negative for abdominal pain, constipation, diarrhea, nausea and vomiting.  Endocrine: Negative for cold intolerance and heat intolerance.  Genitourinary:  Negative for dysuria, frequency, urgency, vaginal bleeding and vaginal discharge.  Musculoskeletal:  Positive for arthralgias (Bilateral knees, right greater than left).  Skin:  Negative for rash and wound.  Neurological:  Negative for dizziness, light-headedness and headaches.  Hematological:  Does not bruise/bleed easily.  Psychiatric/Behavioral:  Negative for self-injury, sleep disturbance and suicidal ideas. The patient is not nervous/anxious.     Objective:   Today's Vitals: BP 123/81   Pulse 82   Resp 20   Ht _0  (1.575 m)   Wt  127 lb 4.8 oz (57.7 kg)   SpO2 98%   BMI 23.28 kg/m   Physical Exam Vitals reviewed.  Constitutional:      General: She is not in acute distress.    Appearance: Normal appearance.  HENT:     Head: Normocephalic and atraumatic.     Right Ear: Tympanic membrane is perforated.     Left Ear: There is impacted cerumen.  Cardiovascular:     Rate and Rhythm: Normal rate and regular rhythm.     Pulses: Normal pulses.     Heart sounds: Normal heart sounds. No murmur heard.    No friction rub. No gallop.  Pulmonary:     Effort: Pulmonary effort is  normal. No respiratory distress.     Breath sounds: Normal breath sounds. No wheezing.  Skin:    General: Skin is warm and dry.  Neurological:     Mental Status: She is alert and oriented to person, place, and time.  Psychiatric:        Mood and Affect: Mood normal.        Behavior: Behavior normal.        Thought Content: Thought content normal.        Judgment: Judgment normal.    Assessment & Plan:   1. Encounter to establish care Reviewed available information and discussed care concerns with patient.   2. Chronic pain of right knee Likely osteoarthritis but we will go ahead and get the x-rays today.  Okay to continue ibuprofen 400 mg as needed.  Consider adjunct therapy such as lidocaine patches or topical analgesics.  Recommend working on strengthening exercises for the lower extremities. - DG Knee Complete 4 Views Right; Future  3. Caregiver stress Unfortunately, her language barrier and cultural practices limit the availability of counselors in our area.  I will put out any feelers to see if there is anyone that may be able to meet those needs and if so, place that referral.  In the meantime, starting sertraline 25 mg daily for 1 week and then increase to 50 mg daily.  Discussed medication side effects and expectations for effectiveness.  4. Impacted cerumen of left ear Medical necessity statement: On physical examination,  cerumen impairs clinically significant portions of the external auditory canal, and tympanic membrane. Noted obstructive, copious cerumen that cannot be removed without magnification and instrumentations requiring physician skills Consent: Discussed benefits and risks of procedure and verbal consent obtained Procedure: Patient was prepped for the procedure. Utilized an otoscope to assess and take note of the ear canal, the tympanic membrane, and the presence, amount, and placement of the cerumen. Gentle water irrigation and soft plastic curette was utilized to remove cerumen.  Post procedure examination: shows cerumen was completely removed. Patient tolerated procedure well. The patient is made aware that they may experience temporary vertigo, temporary hearing loss, and temporary discomfort. If these symptom last for more than 24 hours to call the clinic or proceed to the ED.  5. Perforation of right tympanic membrane Urgent referral placed to ENT for further evaluation.  Advised to avoid placing anything in the ear and trying to keep water out is much as possible. - Ambulatory referral to ENT  6. Dyslipidemia Labs up-to-date.  Continue simvastatin 20 mg daily.  7. Iron deficiency anemia, unspecified iron deficiency anemia type Managed by hematology.  8. Essential hypertension Blood pressure at goal today.  Continue losartan 50 mg daily.  9. Type 2 diabetes mellitus without complication, without long-term current use of insulin (HCC) Recent hemoglobin A1c look great.  Continue metformin 500 mg twice daily.  10. Age-related osteoporosis without current pathological fracture Continue Fosamax.  She will be due for DEXA scan soon but we will address this at her upcoming visit.   Outpatient Encounter Medications as of 03/17/2022  Medication Sig   alendronate (FOSAMAX) 70 MG tablet Take 1 tablet (70 mg total) by mouth once a week. Take with a full glass of water on an empty stomach.    anastrozole (ARIMIDEX) 1 MG tablet TAKE 1 TABLET (1 MG TOTAL) BY MOUTH DAILY. MUST HAVE OFFICE VISIT FOR ADDITIONAL REFILLS   Cholecalciferol (VITAMIN D) 50 MCG (2000 UT) tablet Take 1 tablet (2,000 Units  total) by mouth daily.   ferrous sulfate 325 (65 FE) MG tablet Take 1 tablet (325 mg total) by mouth daily with breakfast.   losartan (COZAAR) 50 MG tablet Take 1 tablet (50 mg total) by mouth daily.   metFORMIN (GLUCOPHAGE) 500 MG tablet Take 1 tablet (500 mg total) by mouth 2 (two) times daily with a meal.   Multiple Vitamin (MULTIVITAMIN PO) Take 1 tablet by mouth daily at 6 (six) AM.   Na Sulfate-K Sulfate-Mg Sulf 17.5-3.13-1.6 GM/177ML SOLN Take by mouth.   sertraline (ZOLOFT) 25 MG tablet Take 1 tablet daily for one week then increase to two tablets daily.   simvastatin (ZOCOR) 20 MG tablet Take 1 tablet (20 mg total) by mouth daily.   topiramate (TOPAMAX) 25 MG tablet Take 1 tablet (25 mg total) by mouth daily. For headaches   No facility-administered encounter medications on file as of 03/17/2022.    Follow-up: Return in about 6 weeks (around 04/28/2022) for mood follow up.   Clearnce Sorrel, DNP, APRN, FNP-BC [facility]

## 2022-04-19 ENCOUNTER — Ambulatory Visit
Admission: RE | Admit: 2022-04-19 | Discharge: 2022-04-19 | Disposition: A | Discharge status: Home / Self Care | Payer: Medicaid Other | Source: Ambulatory Visit | Attending: Nurse Practitioner | Admitting: Nurse Practitioner

## 2022-04-19 DIAGNOSIS — Z1231 Encounter for screening mammogram for malignant neoplasm of breast: Secondary | ICD-10-CM

## 2022-04-21 ENCOUNTER — Ambulatory Visit (INDEPENDENT_AMBULATORY_CARE_PROVIDER_SITE_OTHER): Payer: Medicaid Other

## 2022-04-21 ENCOUNTER — Encounter: Payer: Self-pay | Admitting: Medical-Surgical

## 2022-04-21 ENCOUNTER — Ambulatory Visit (INDEPENDENT_AMBULATORY_CARE_PROVIDER_SITE_OTHER): Payer: Medicaid Other | Admitting: Medical-Surgical

## 2022-04-21 ENCOUNTER — Encounter: Payer: Self-pay | Admitting: Oncology

## 2022-04-21 ENCOUNTER — Other Ambulatory Visit (HOSPITAL_BASED_OUTPATIENT_CLINIC_OR_DEPARTMENT_OTHER): Payer: Self-pay

## 2022-04-21 VITALS — BP 154/76 | HR 77 | Resp 20

## 2022-04-21 DIAGNOSIS — Z636 Dependent relative needing care at home: Secondary | ICD-10-CM

## 2022-04-21 DIAGNOSIS — G8929 Other chronic pain: Secondary | ICD-10-CM | POA: Diagnosis not present

## 2022-04-21 DIAGNOSIS — M25561 Pain in right knee: Secondary | ICD-10-CM

## 2022-04-21 MED ORDER — SERTRALINE HCL 100 MG PO TABS
100.0000 mg | ORAL_TABLET | Freq: Every day | ORAL | 3 refills | Status: DC
  Filled 2022-04-21: qty 90, 90d supply, fill #0
  Filled 2022-08-06: qty 90, 90d supply, fill #1

## 2022-04-21 MED ORDER — MELOXICAM 15 MG PO TABS
15.0000 mg | ORAL_TABLET | Freq: Every day | ORAL | 0 refills | Status: DC
  Filled 2022-04-21: qty 30, 30d supply, fill #0

## 2022-04-21 MED ORDER — SERTRALINE HCL 100 MG PO TABS: 100.0000 mg | ORAL_TABLET | Freq: Every day | ORAL | 3 refills | Status: DC

## 2022-04-21 MED ORDER — MELOXICAM 15 MG PO TABS: 15.0000 mg | ORAL_TABLET | Freq: Every day | ORAL | 0 refills | Status: DC

## 2022-04-21 NOTE — Progress Notes (Signed)
Established Patient Office Visit  Subjective   Patient ID: Emily Hendricks, female   DOB: 1956-01-10 Age: 67 y.o. MRN: [id number]   Chief Complaint  Patient presents with   Follow-up    MOOD   HPI Pleasant 67 year old female accompanied by her daughter who serves as interpreter presenting for the following:  Mood: Has been taking sertraline 50 mg daily, tolerating well without side effects.  Reports that she feels like the medication is helping with some of her caregiver stress.  Her daughter reports that she has always been a very anxious person and she feels that sometimes she sees her mother get anxious but her mother seems to think it is a normal state of affairs.  Continues to take care of of her husband and now reports that her mother is ill and nearing death.  She is unable to go see her mother because of having to be here with her husband.  Denies SI/HI.  Right knee: Discussed right knee pain at her previous appointment.  Over the last month, the pain has gotten much worse and she is now having difficulty walking on it.  She was provided orders to have x-rays done but did not do this prior to leaving our office last time.  Has been using extra strength Tylenol which seems to help some.  Her daughter has been massaging the knee nightly to help with pain.  Has not tried bracing, icing, elevation, or compression.  No popping, clicking, or locking.  No recent injuries or falls.   Objective:    Vitals:   04/21/22 1020 04/21/22 1101  BP: (!) 168/83 (!) 154/76  Pulse: 75 77  Resp:  20  SpO2: 100% (!) 77%   Physical Exam Vitals and nursing note reviewed.  Constitutional:      General: She is not in acute distress.    Appearance: Normal appearance. She is not ill-appearing.  HENT:     Head: Normocephalic and atraumatic.  Cardiovascular:     Rate and Rhythm: Normal rate and regular rhythm.     Pulses: Normal pulses.     Heart sounds: Normal heart sounds.  Pulmonary:      Effort: Pulmonary effort is normal. No respiratory distress.     Breath sounds: Normal breath sounds. No wheezing, rhonchi or rales.  Musculoskeletal:     Right knee: Swelling and effusion present. No erythema. Decreased range of motion. Tenderness present over the medial joint line and patellar tendon. No lateral joint line, MCL or LCL tenderness. Normal pulse.     Instability Tests: Anterior drawer test negative. Posterior drawer test negative.     Left knee: Normal.  Skin:    General: Skin is warm and dry.  Neurological:     Mental Status: She is alert and oriented to person, place, and time.  Psychiatric:        Mood and Affect: Mood normal.        Behavior: Behavior normal.        Thought Content: Thought content normal.        Judgment: Judgment normal.   No results found for this or any previous visit (from the past 24 hour(s)).     The ASCVD Risk score (Arnett DK, et al., 2019) failed to calculate for the following reasons:   Unable to determine if patient is Non-Hispanic African American   Assessment & Plan:   1. Caregiver stress Some notable improvement in symptoms so far but feels that the medication  could work a little better.  She would like to try a higher dose so increasing sertraline to 100 mg daily.  Monitor for worsening symptoms or additional concerns. - sertraline (ZOLOFT) 100 MG tablet; Take 1 tablet (100 mg total) by mouth daily.  Dispense: 90 tablet; Refill: 3  2. Chronic pain of right knee Urged them to go to have the x-rays completed of the right knee for further evaluation.  Adding meloxicam 15 mg daily.  Discussed RICE.  Would like her to avoid knee braces however a knee sleeve that provides some mild compression throughout the day may help with pain.  Okay to use Tylenol as needed but avoid over-the-counter NSAIDs.  Holding off on steroids as she is diabetic and would like to avoid systemic effects.  May benefit from an injection but would like to see what  the x-rays look like first.  Return in about 6 weeks (around 06/02/2022) for mood follow up.  ___________________________________________ Clearnce Sorrel, DNP, APRN, FNP-BC [facility] [place]

## 2022-04-26 ENCOUNTER — Telehealth: Payer: Self-pay

## 2022-04-26 NOTE — Telephone Encounter (Signed)
Please let patient's daughter Alto Denver) know where referral for PT was sent once done so she can contact them directly as she will be taking patient to appointments.   Thank you Tosha!!

## 2022-04-28 ENCOUNTER — Telehealth: Payer: Self-pay | Admitting: Medical-Surgical

## 2022-04-28 NOTE — Telephone Encounter (Signed)
At most recent visit, patient and daughter presenting paperwork to be completed for "Medical Certification for Disability Exceptions" regarding her citizenship. After carefully reviewing her chart and medical information previously documented by a prior PCP, there are no health concerns or disabilities that will qualify for exception according to the rules and instructions on this form. I will be unable to complete the document. I will place it at the front desk for pickup should they need the copy back. Please contact the patient's daughter to notify her of this.  ___________________________________________ Clearnce Sorrel, DNP, APRN, FNP-BC [facility] [place]

## 2022-04-29 ENCOUNTER — Ambulatory Visit: Payer: Medicaid Other | Admitting: Medical-Surgical

## 2022-04-29 NOTE — Telephone Encounter (Signed)
Patient's daughter advised.  

## 2022-04-29 NOTE — Telephone Encounter (Signed)
Referral and clinical notes have been faxed to [facility] through [facility]. Left message on Patient's daughter Alto Denver) voicemail to contact the office for further assistance.

## 2022-05-04 ENCOUNTER — Other Ambulatory Visit: Payer: Self-pay | Admitting: Nurse Practitioner

## 2022-05-04 DIAGNOSIS — R519 Headache, unspecified: Secondary | ICD-10-CM

## 2022-05-05 ENCOUNTER — Ambulatory Visit: Payer: Medicaid Other | Attending: Medical-Surgical | Admitting: Physical Therapy

## 2022-05-05 ENCOUNTER — Encounter: Payer: Self-pay | Admitting: Physical Therapy

## 2022-05-05 ENCOUNTER — Other Ambulatory Visit: Payer: Self-pay

## 2022-05-05 DIAGNOSIS — M25561 Pain in right knee: Secondary | ICD-10-CM | POA: Diagnosis not present

## 2022-05-05 DIAGNOSIS — G8929 Other chronic pain: Secondary | ICD-10-CM | POA: Insufficient documentation

## 2022-05-05 NOTE — Therapy (Signed)
OUTPATIENT PHYSICAL THERAPY LOWER EXTREMITY EVALUATION   Patient Name: Emily Hendricks MRN: [id number] DOB:09/06/1955, 67 y.o., female Today's Date: 05/05/2022  END OF SESSION:  PT End of Session - 05/05/22 1355     Visit Number 1    Number of Visits 4    Date for PT Re-Evaluation 06/02/22    Authorization Type medicaid    Authorization - Visit Number 1    Progress Note Due on Visit 4    PT Start Time [id number]    PT Stop Time 1350    PT Time Calculation (min) 35 min    Activity Tolerance Patient tolerated treatment well    Behavior During Therapy WFL for tasks assessed/performed             Past Medical History:  Diagnosis Date   Breast cancer ([place]) 2020   mastectomy   Diabetes mellitus without complication ([place])    on meds   Family history of cancer    Hyperlipidemia    on meds   Hypertension    on meds   Personal history of radiation therapy    Past Surgical History:  Procedure Laterality Date   BREAST LUMPECTOMY     BREAST LUMPECTOMY WITH RADIOACTIVE SEED AND SENTINEL LYMPH NODE BIOPSY Right 08/31/2018   Procedure: RIGHT BREAST LUMPECTOMY WITH RADIOACTIVE SEED AND SENTINEL LYMPH NODE BIOPSY;  Surgeon: Stark Klein, MD;  Location: [facility];  Service: General;  Laterality: Right;   COLONOSCOPY  2018   HD-MAC-supre(fair) with lavage-TA x 6   POLYPECTOMY  2018   TA x 6   TUBAL LIGATION     Patient Active Problem List   Diagnosis Date Noted   Type 2 diabetes mellitus without complication, without long-term current use of insulin ([place]) 03/17/2022   Perforation of right tympanic membrane 03/17/2022   Chronic pain of right knee 03/17/2022   Essential hypertension 09/17/2020   Dyslipidemia 09/17/2020   Pre-diabetes 09/17/2020   Absolute anemia 08/12/2020   Iron deficiency anemia 08/12/2020   Genetic testing 07/24/2018   Family history of cancer    Malignant neoplasm of upper-inner quadrant of right breast in female, estrogen receptor positive  ([place]) 07/06/2018    PCP: Samuel Bouche  REFERRING PROVIDER: Samuel Bouche  REFERRING DIAG: chronic pain of Rt knee  THERAPY DIAG:  Chronic pain of right knee  Rationale for Evaluation and Treatment: Rehabilitation  ONSET DATE: 02/2022  SUBJECTIVE:   SUBJECTIVE STATEMENT: Pt has been having Rt knee pain for about 1.5 months. She states she has been taking some tylenol but it still hurts. Pain increases with prolonged standing and walking more than 30 minutes, eases with ice, elevation and rest.  PERTINENT HISTORY: arthritis PAIN:  Are you having pain? Yes: NPRS scale: 2-3 at rest, 6-7 when standing or walking/10 Pain location: Rt knee Pain description: achey Aggravating factors: standing, walking Relieving factors: rest, ice  PRECAUTIONS: None  WEIGHT BEARING RESTRICTIONS: No  FALLS:  Has patient fallen in last 6 months? No  LIVING ENVIRONMENT: Lives with: lives with their family Has following equipment at home: None  OCCUPATION: retired  PLOF: Independent  PATIENT GOALS: decrease pain  NEXT MD VISIT: 06/03/22  OBJECTIVE:   DIAGNOSTIC FINDINGS: x ray: Mild degenerative change without acute abnormality.   PALPATION: TTP with medial patellar mobs, TTP medial anterior knee Some increased mm spasticity in hamstrings  LOWER EXTREMITY ROM:  Active ROM Right eval Left eval  Hip flexion    Hip extension  Hip abduction    Hip adduction    Hip internal rotation    Hip external rotation    Knee flexion 115   Knee extension 0   Ankle dorsiflexion    Ankle plantarflexion    Ankle inversion    Ankle eversion     (Blank rows = not tested)  LOWER EXTREMITY MMT:  MMT Right eval Left eval  Hip flexion 3 4  Hip extension 3 4  Hip abduction 3 4  Hip adduction    Hip internal rotation    Hip external rotation    Knee flexion 3+   Knee extension 3+   Ankle dorsiflexion    Ankle plantarflexion    Ankle inversion    Ankle eversion     (Blank rows = not  tested)   FUNCTIONAL TESTS:  5 times sit to stand: 17.27 sec  GAIT: Distance walked: 100' Assistive device utilized: None Level of assistance: Complete Independence Comments: mild antalgic gait with decreased stance on Rt Stairs: reciprocal ascending, step to descending with bilat UE support  TODAY'S TREATMENT:                                                                                                                              DATE: 05/05/22 See HEP    PATIENT EDUCATION:  Education details: PT POC and goals, HEP Person educated: Patient and Child(ren) Education method: Explanation, Demonstration, and Handouts Education comprehension: verbalized understanding and returned demonstration  HOME EXERCISE PROGRAM: Access Code: [email or web address] URL: [email or web address] Date: 05/05/2022 Prepared by: Isabelle Course  Exercises - Step Up  - 1 x daily - 7 x weekly - 3 sets - 10 reps - Supine Bridge  - 1 x daily - 7 x weekly - 3 sets - 10 reps - Active Straight Leg Raise with Quad Set  - 1 x daily - 7 x weekly - 3 sets - 10 reps - Straight Leg Raise with External Rotation  - 1 x daily - 7 x weekly - 3 sets - 10 reps - Sidelying Hip Abduction  - 1 x daily - 7 x weekly - 3 sets - 10 reps  ASSESSMENT:  CLINICAL IMPRESSION: Patient is a 67 y.o. female who was seen today for physical therapy evaluation and treatment for chronic Rt knee pain. She presents with decreased strength, impaired gait and balance, decreased activity tolerance and will benefit from skilled PT to address deficits and improve functional pain free mobility.   OBJECTIVE IMPAIRMENTS: decreased activity tolerance, decreased balance, difficulty walking, decreased strength, and pain.   ACTIVITY LIMITATIONS: standing and locomotion level  PARTICIPATION LIMITATIONS: meal prep  PERSONAL FACTORS: Time since onset of injury/illness/exacerbation are also affecting patient's functional outcome.   REHAB  POTENTIAL: Good  CLINICAL DECISION MAKING: Stable/uncomplicated  EVALUATION COMPLEXITY: Low   GOALS: Goals reviewed with patient? Yes  SHORT TERM GOALS: Target date: 05/19/2022   Pt will be independent in initial HEP Baseline: HEP not  initiated Goal status: INITIAL   LONG TERM GOALS: Target date: 06/02/2022    Pt will be independent with advanced HEP Baseline: HEP not initiated Goal status: INITIAL  2.  Pt will improve 5 x STS to <=14 seconds to demo improved functional strength Baseline: 17.27 sec Goal status: INITIAL  3.  Pt will improve Rt knee strength to 4+/5 to stand and walk with decreased pain Baseline: 3/5 Goal status: INITIAL  4.  Pt will tolerate standing x 45 minutes with pain <= 2/10 Baseline: pt able to stand 30 minutes Goal status: INITIAL   PLAN:  PT FREQUENCY: 1x/week  PT DURATION: 4 weeks  PLANNED INTERVENTIONS: Therapeutic exercises, Therapeutic activity, Neuromuscular re-education, Balance training, Gait training, Patient/Family education, Self Care, Joint mobilization, Aquatic Therapy, Dry Needling, Electrical stimulation, Cryotherapy, Moist heat, Taping, Manual therapy, and Re-evaluation  PLAN FOR NEXT SESSION: progress Rt knee strength and stability, balance   Kaimen Peine, PT 05/05/2022, 1:56 PM

## 2022-05-13 ENCOUNTER — Ambulatory Visit: Payer: Medicaid Other | Admitting: Physical Therapy

## 2022-05-13 ENCOUNTER — Encounter: Payer: Self-pay | Admitting: Physical Therapy

## 2022-05-13 DIAGNOSIS — M25561 Pain in right knee: Secondary | ICD-10-CM | POA: Diagnosis not present

## 2022-05-13 DIAGNOSIS — G8929 Other chronic pain: Secondary | ICD-10-CM | POA: Diagnosis not present

## 2022-05-13 NOTE — Therapy (Signed)
OUTPATIENT PHYSICAL THERAPY LOWER EXTREMITY EVALUATION   Patient Name: Emily Hendricks MRN: [id number] DOB:1955-05-09, 67 y.o., female Today's Date: 05/13/2022  END OF SESSION:  PT End of Session - 05/13/22 1335     Visit Number 2    Number of Visits 4    Date for PT Re-Evaluation 06/02/22    Authorization Type medicaid    Authorization - Visit Number 1    Progress Note Due on Visit 4    PT Start Time [id number]    PT Stop Time [id number]    PT Time Calculation (min) 45 min    Activity Tolerance Patient tolerated treatment well    Behavior During Therapy WFL for tasks assessed/performed             Past Medical History:  Diagnosis Date   Breast cancer ([place]) 2020   mastectomy   Diabetes mellitus without complication ([place])    on meds   Family history of cancer    Hyperlipidemia    on meds   Hypertension    on meds   Personal history of radiation therapy    Past Surgical History:  Procedure Laterality Date   BREAST LUMPECTOMY     BREAST LUMPECTOMY WITH RADIOACTIVE SEED AND SENTINEL LYMPH NODE BIOPSY Right 08/31/2018   Procedure: RIGHT BREAST LUMPECTOMY WITH RADIOACTIVE SEED AND SENTINEL LYMPH NODE BIOPSY;  Surgeon: Stark Klein, MD;  Location: [facility];  Service: General;  Laterality: Right;   COLONOSCOPY  2018   HD-MAC-supre(fair) with lavage-TA x 6   POLYPECTOMY  2018   TA x 6   TUBAL LIGATION     Patient Active Problem List   Diagnosis Date Noted   Type 2 diabetes mellitus without complication, without long-term current use of insulin ([place]) 03/17/2022   Perforation of right tympanic membrane 03/17/2022   Chronic pain of right knee 03/17/2022   Essential hypertension 09/17/2020   Dyslipidemia 09/17/2020   Pre-diabetes 09/17/2020   Absolute anemia 08/12/2020   Iron deficiency anemia 08/12/2020   Genetic testing 07/24/2018   Family history of cancer    Malignant neoplasm of upper-inner quadrant of right breast in female, estrogen receptor positive  ([place]) 07/06/2018    PCP: Samuel Bouche  REFERRING PROVIDER: Samuel Bouche  REFERRING DIAG: chronic pain of Rt knee  THERAPY DIAG:  Chronic pain of right knee  Rationale for Evaluation and Treatment: Rehabilitation  ONSET DATE: 02/2022  SUBJECTIVE:   SUBJECTIVE STATEMENT: Pt states she is wearing a knee sleeve on her R knee. Pt reports knee is doing okay today. Pt states no problems with the exercises  PERTINENT HISTORY: arthritis PAIN:  Are you having pain? Yes: NPRS scale: 2-3 at rest, 6-7 when standing or walking/10 Pain location: Rt knee Pain description: achey Aggravating factors: standing, walking Relieving factors: rest, ice  PRECAUTIONS: None  WEIGHT BEARING RESTRICTIONS: No  FALLS:  Has patient fallen in last 6 months? No  LIVING ENVIRONMENT: Lives with: lives with their family Has following equipment at home: None  OCCUPATION: retired  PATIENT GOALS: decrease pain  NEXT MD VISIT: 06/03/22  OBJECTIVE:   DIAGNOSTIC FINDINGS: x ray: Mild degenerative change without acute abnormality.   PALPATION: TTP with medial patellar mobs, TTP medial anterior knee Some increased mm spasticity in hamstrings  LOWER EXTREMITY ROM:  Active ROM Right eval Left eval  Hip flexion    Hip extension    Hip abduction    Hip adduction    Hip internal rotation  Hip external rotation    Knee flexion 115   Knee extension 0   Ankle dorsiflexion    Ankle plantarflexion    Ankle inversion    Ankle eversion     (Blank rows = not tested)  LOWER EXTREMITY MMT:  MMT Right eval Left eval  Hip flexion 3 4  Hip extension 3 4  Hip abduction 3 4  Hip adduction    Hip internal rotation    Hip external rotation    Knee flexion 3+   Knee extension 3+   Ankle dorsiflexion    Ankle plantarflexion    Ankle inversion    Ankle eversion     (Blank rows = not tested)   FUNCTIONAL TESTS:  5 times sit to stand: 17.27 sec  GAIT: Distance walked: 100' Assistive device  utilized: None Level of assistance: Complete Independence Comments: mild antalgic gait with decreased stance on Rt Stairs: reciprocal ascending, step to descending with bilat UE support  TODAY'S TREATMENT:         [facility] Adult PT Treatment:                                                DATE: 05/13/22 Therapeutic Exercise: Nustep L6 x 5 min Seated hamstring stretch x 30 sec Supine bridge 2x10 SLR 2x10 S/L hip abd 2x10 with AAROM Prone quad stretch x 30 sec Prone hamstring curl red TB 2x10 Prone hip ext 2x10 AAROM Standing hip flexion red TB 2x10x3 sec Mini squat with red TB around knees 2x10                                                                                                                        DATE: 05/05/22 See HEP    PATIENT EDUCATION:  Education details: PT POC and goals, HEP Person educated: Patient and Child(ren) Education method: Explanation, Demonstration, and Handouts Education comprehension: verbalized understanding and returned demonstration  HOME EXERCISE PROGRAM: Access Code: [email or web address] URL: [email or web address] Date: 05/05/2022 Prepared by: Isabelle Course  Exercises - Step Up  - 1 x daily - 7 x weekly - 3 sets - 10 reps - Supine Bridge  - 1 x daily - 7 x weekly - 3 sets - 10 reps - Active Straight Leg Raise with Quad Set  - 1 x daily - 7 x weekly - 3 sets - 10 reps - Straight Leg Raise with External Rotation  - 1 x daily - 7 x weekly - 3 sets - 10 reps - Sidelying Hip Abduction  - 1 x daily - 7 x weekly - 3 sets - 10 reps  ASSESSMENT:  CLINICAL IMPRESSION: Reviewed HEP. Worked on gross knee and hip strengthening. Requires increased tactile and verbal cues to perform with correct form. Interpreter present to assist. Difficulty with glute activation especially in prone.   OBJECTIVE IMPAIRMENTS: decreased activity  tolerance, decreased balance, difficulty walking, decreased strength, and pain.    GOALS: Goals reviewed with patient?  Yes  SHORT TERM GOALS: Target date: 05/19/2022   Pt will be independent in initial HEP Baseline: HEP not initiated Goal status: INITIAL   LONG TERM GOALS: Target date: 06/02/2022    Pt will be independent with advanced HEP Baseline: HEP not initiated Goal status: INITIAL  2.  Pt will improve 5 x STS to <=14 seconds to demo improved functional strength Baseline: 17.27 sec Goal status: INITIAL  3.  Pt will improve Rt knee strength to 4+/5 to stand and walk with decreased pain Baseline: 3/5 Goal status: INITIAL  4.  Pt will tolerate standing x 45 minutes with pain <= 2/10 Baseline: pt able to stand 30 minutes Goal status: INITIAL   PLAN:  PT FREQUENCY: 1x/week  PT DURATION: 4 weeks  PLANNED INTERVENTIONS: Therapeutic exercises, Therapeutic activity, Neuromuscular re-education, Balance training, Gait training, Patient/Family education, Self Care, Joint mobilization, Aquatic Therapy, Dry Needling, Electrical stimulation, Cryotherapy, Moist heat, Taping, Manual therapy, and Re-evaluation  PLAN FOR NEXT SESSION: progress Rt knee strength and stability, balance   Elantra Caprara April Ma L Anastasija Anfinson, PT 05/13/2022, 1:36 PM

## 2022-05-17 ENCOUNTER — Encounter: Payer: Self-pay | Admitting: Rehabilitative and Restorative Service Providers"

## 2022-05-25 ENCOUNTER — Ambulatory Visit: Payer: Medicaid Other | Attending: Medical-Surgical | Admitting: Physical Therapy

## 2022-05-25 ENCOUNTER — Encounter: Payer: Self-pay | Admitting: Physical Therapy

## 2022-05-25 DIAGNOSIS — M25561 Pain in right knee: Secondary | ICD-10-CM | POA: Diagnosis not present

## 2022-05-25 DIAGNOSIS — G8929 Other chronic pain: Secondary | ICD-10-CM | POA: Diagnosis not present

## 2022-05-25 NOTE — Therapy (Addendum)
OUTPATIENT PHYSICAL THERAPY LOWER EXTREMITY TREATMENT AND DISCHARGE  PHYSICAL THERAPY DISCHARGE SUMMARY  Visits from Start of Care: 3  Current functional level related to goals / functional outcomes: See below. Pt with lack of consistent attendance to PT sessions.    Remaining deficits: See below. Unsure as pt has not returned to recheck goals.    Education / Equipment: See below   Patient agrees to discharge. Patient goals were not met. Patient is being discharged due to not returning since the last visit.    Patient Name: Emily Hendricks MRN: [id number] DOB:Aug 07, 1955, 67 y.o., female Today's Date: 05/25/2022  END OF SESSION:  PT End of Session - 05/25/22 1319     Visit Number 3    Number of Visits 4    Date for PT Re-Evaluation 06/02/22    Authorization Type medicaid    Authorization - Visit Number 3    Progress Note Due on Visit 4    PT Start Time 1319    PT Stop Time 1400    PT Time Calculation (min) 41 min    Activity Tolerance Patient tolerated treatment well    Behavior During Therapy WFL for tasks assessed/performed             Past Medical History:  Diagnosis Date   Breast cancer ([facility]) 2020   mastectomy   Diabetes mellitus without complication ([facility])    on meds   Family history of cancer    Hyperlipidemia    on meds   Hypertension    on meds   Personal history of radiation therapy    Past Surgical History:  Procedure Laterality Date   BREAST LUMPECTOMY     BREAST LUMPECTOMY WITH RADIOACTIVE SEED AND SENTINEL LYMPH NODE BIOPSY Right 08/31/2018   Procedure: RIGHT BREAST LUMPECTOMY WITH RADIOACTIVE SEED AND SENTINEL LYMPH NODE BIOPSY;  Surgeon: Almond Lint, MD;  [facility];  Service: General;  Laterality: Right;   COLONOSCOPY  2018   HD-MAC-supre(fair) with lavage-TA x 6   POLYPECTOMY  2018   TA x 6   TUBAL LIGATION     Patient Active Problem List   Diagnosis Date Noted   Type 2 diabetes mellitus without  complication, without long-term current use of insulin ([facility]) 03/17/2022   Perforation of right tympanic membrane 03/17/2022   Chronic pain of right knee 03/17/2022   Essential hypertension 09/17/2020   Dyslipidemia 09/17/2020   Pre-diabetes 09/17/2020   Absolute anemia 08/12/2020   Iron deficiency anemia 08/12/2020   Genetic testing 07/24/2018   Family history of cancer    Malignant neoplasm of upper-inner quadrant of right breast in female, estrogen receptor positive ([facility]) 07/06/2018    PCP: Christen Butter  REFERRING PROVIDER: Christen Butter  REFERRING DIAG: chronic pain of Rt knee  THERAPY DIAG:  Chronic pain of right knee  Rationale for Evaluation and Treatment: Rehabilitation  ONSET DATE: 02/2022  SUBJECTIVE:   SUBJECTIVE STATEMENT: Pt states she has been doing the exercises at home. Pt notes continued knee pain in the front but "not much."  PERTINENT HISTORY: arthritis PAIN:  Are you having pain? Yes: NPRS scale: 2-3 at rest, 6-7 when standing or walking/10 Pain location: Rt knee Pain description: achey Aggravating factors: standing, walking Relieving factors: rest, ice  PRECAUTIONS: None  WEIGHT BEARING RESTRICTIONS: No  FALLS:  Has patient fallen in last 6 months? No  LIVING ENVIRONMENT: Lives with: lives with their family Has following equipment at home: None  OCCUPATION: retired  PATIENT  GOALS: decrease pain  NEXT MD VISIT: 06/03/22  OBJECTIVE:   DIAGNOSTIC FINDINGS: x ray: Mild degenerative change without acute abnormality.   PALPATION: TTP with medial patellar mobs, TTP medial anterior knee Some increased mm spasticity in hamstrings  LOWER EXTREMITY ROM:  Active ROM Right eval Left eval  Hip flexion    Hip extension    Hip abduction    Hip adduction    Hip internal rotation    Hip external rotation    Knee flexion 115   Knee extension 0   Ankle dorsiflexion    Ankle plantarflexion    Ankle inversion    Ankle eversion     (Blank  rows = not tested)  LOWER EXTREMITY MMT:  MMT Right eval Left eval  Hip flexion 3 4  Hip extension 3 4  Hip abduction 3 4  Hip adduction    Hip internal rotation    Hip external rotation    Knee flexion 3+   Knee extension 3+   Ankle dorsiflexion    Ankle plantarflexion    Ankle inversion    Ankle eversion     (Blank rows = not tested)   FUNCTIONAL TESTS:  5 times sit to stand: 17.27 sec  GAIT: Distance walked: 100' Assistive device utilized: None Level of assistance: Complete Independence Comments: mild antalgic gait with decreased stance on Rt Stairs: reciprocal ascending, step to descending with bilat UE support  TODAY'S TREATMENT:        [facility] Adult PT Treatment:                                                DATE: 05/25/22 Therapeutic Exercise: Recumbent bike L1 x 5 min Supine bridge x10, figure 4 bridge x10 Prone glute set 2x10 Prone quad stretch with strap 2x30 sec on R Prone hip ER/IR 2x10 on R Sitting marching red TB 2x10 Sitting LAQ red TB 2x10 Sit<>stand red TB around knees 2x10     [facility] Adult [facility] Treatment:                                                DATE: 05/13/22 Therapeutic Exercise: Nustep L6 x 5 min Seated hamstring stretch x 30 sec Supine bridge 2x10 SLR 2x10 S/L hip abd 2x10 with AAROM Prone quad stretch x 30 sec Prone hamstring curl red TB 2x10 Prone hip ext 2x10 AAROM Standing hip flexion red TB 2x10x3 sec Mini squat with red TB around knees 2x10                                                                                                                        DATE: 05/05/22 See HEP    PATIENT EDUCATION:  Education details: PT POC and goals, HEP Person educated: Patient and Child(ren) Education method: Explanation, Demonstration, and Handouts Education comprehension: verbalized understanding and returned demonstration  HOME EXERCISE PROGRAM: Access Code: [email or web address] URL: [email or web address] Date:  05/05/2022 Prepared by: Reggy Eye  Exercises - Step Up  - 1 x daily - 7 x weekly - 3 sets - 10 reps - Supine Bridge  - 1 x daily - 7 x weekly - 3 sets - 10 reps - Active Straight Leg Raise with Quad Set  - 1 x daily - 7 x weekly - 3 sets - 10 reps - Straight Leg Raise with External Rotation  - 1 x daily - 7 x weekly - 3 sets - 10 reps - Sidelying Hip Abduction  - 1 x daily - 7 x weekly - 3 sets - 10 reps  ASSESSMENT:  CLINICAL IMPRESSION: Interpreter used via video remote interpreting. Reviewed HEP. Continued to progress as tolerated. Requires multiple tactile cues to perform exercises accurately. Able to tolerate more exercises with red TB. Notes no knee pain at end of session.   OBJECTIVE IMPAIRMENTS: decreased activity tolerance, decreased balance, difficulty walking, decreased strength, and pain.    GOALS: Goals reviewed with patient? Yes  SHORT TERM GOALS: Target date: 05/19/2022   Pt will be independent in initial HEP Baseline: HEP not initiated Goal status: INITIAL   LONG TERM GOALS: Target date: 06/02/2022    Pt will be independent with advanced HEP Baseline: HEP not initiated Goal status: INITIAL  2.  Pt will improve 5 x STS to <=14 seconds to demo improved functional strength Baseline: 17.27 sec Goal status: INITIAL  3.  Pt will improve Rt knee strength to 4+/5 to stand and walk with decreased pain Baseline: 3/5 Goal status: INITIAL  4.  Pt will tolerate standing x 45 minutes with pain <= 2/10 Baseline: pt able to stand 30 minutes Goal status: INITIAL   PLAN:  PT FREQUENCY: 1x/week  PT DURATION: 4 weeks  PLANNED INTERVENTIONS: Therapeutic exercises, Therapeutic activity, Neuromuscular re-education, Balance training, Gait training, Patient/Family education, Self Care, Joint mobilization, Aquatic Therapy, Dry Needling, Electrical stimulation, Cryotherapy, Moist heat, Taping, Manual therapy, and Re-evaluation  PLAN FOR NEXT SESSION: progress Rt  knee strength and stability, balance   Brynlynn Walko April Ma L Raynisha Avilla, PT 05/25/2022, 1:19 PM

## 2022-05-26 ENCOUNTER — Other Ambulatory Visit: Payer: Self-pay | Admitting: Nurse Practitioner

## 2022-05-26 DIAGNOSIS — R519 Headache, unspecified: Secondary | ICD-10-CM

## 2022-05-27 NOTE — Telephone Encounter (Signed)
Requested by interface surescripts. Provider not at this practice. Requested Prescriptions  Refused Prescriptions Disp Refills   topiramate (TOPAMAX) 25 MG tablet [Pharmacy Med Name: TOPIRAMATE '25MG'$  TAB] 30 tablet 5    Sig: TAKE ONE TABLET BY MOUTH DAILY FOR HEADACHES     Neurology: Anticonvulsants - topiramate & zonisamide Passed - 05/26/2022  4:47 PM      Passed - Cr in normal range and within 360 days    Creatinine  Date Value Ref Range Status  07/27/2021 0.74 0.44 - 1.00 mg/dL Final   Creat  Date Value Ref Range Status  02/03/2016 0.78 0.50 - 0.99 mg/dL Final    Comment:      For patients > or = 68 years of age: The upper reference limit for Creatinine is approximately 13% higher for people identified as African-American.      Creatinine, Ser  Date Value Ref Range Status  02/10/2022 0.70 0.57 - 1.00 mg/dL Final         Passed - CO2 in normal range and within 360 days    CO2  Date Value Ref Range Status  02/10/2022 26 20 - 29 mmol/L Final         Passed - ALT in normal range and within 360 days    ALT  Date Value Ref Range Status  02/10/2022 13 0 - 32 IU/L Final  07/27/2021 13 0 - 44 U/L Final         Passed - AST in normal range and within 360 days    AST  Date Value Ref Range Status  02/10/2022 18 0 - 40 IU/L Final  07/27/2021 17 15 - 41 U/L Final         Passed - Completed PHQ-2 or PHQ-9 in the last 360 days      Passed - Valid encounter within last 12 months    Recent Outpatient Visits           1 month ago [place] [facility] [place], [place], NP   2 months ago Encounter to establish care   [place] [facility] [place], NP   3 months ago Type 2 diabetes mellitus with hyperglycemia, without long-term current use of insulin [facility])   [place], Vernia Buff, NP   4 months ago No-show for appointment    [place] [facility], Maisie Fus, DO   6 months ago Frontal headache   [place], Vernia Buff, NP       Future Appointments             In 1 week Samuel Bouche, NP [place] [facility]

## 2022-06-03 ENCOUNTER — Encounter: Payer: Self-pay | Admitting: Medical-Surgical

## 2022-06-03 ENCOUNTER — Other Ambulatory Visit: Payer: Self-pay | Admitting: Nurse Practitioner

## 2022-06-03 ENCOUNTER — Ambulatory Visit (INDEPENDENT_AMBULATORY_CARE_PROVIDER_SITE_OTHER): Payer: Medicaid Other | Admitting: Medical-Surgical

## 2022-06-03 VITALS — BP 133/72 | HR 83 | Resp 20 | Ht 62.0 in | Wt 123.1 lb

## 2022-06-03 DIAGNOSIS — R519 Headache, unspecified: Secondary | ICD-10-CM

## 2022-06-03 DIAGNOSIS — G8929 Other chronic pain: Secondary | ICD-10-CM

## 2022-06-03 DIAGNOSIS — Z636 Dependent relative needing care at home: Secondary | ICD-10-CM

## 2022-06-03 DIAGNOSIS — M25561 Pain in right knee: Secondary | ICD-10-CM

## 2022-06-03 NOTE — Progress Notes (Signed)
   Established Patient Office Visit  Subjective   Patient ID: Emily Hendricks, female   DOB: 12/12/1955 Age: 67 y.o. MRN: [id number]   Chief Complaint  Patient presents with   Follow-up   Anxiety   Stress   HPI Pleasant 67 year old female accompanied by her daughter presenting today for follow-up on stress and anxiety.  Her daughter served as interpreter to facilitate this appointment.  Patient has been taking Zoloft 100 mg daily, tolerating well without side effects.  Has been on this increased dose for the last 6 weeks.  Reports that she feels like the medication is working better and she is handling the stress and anxiety that she has on a daily basis much better than she was.  Since the medication has helped, she would like to stay at this dose for a while longer before making any dose changes.  Continues to have issues with her right knee pain.  She is taking meloxicam 15 mg daily which does seem to help however her knee remains swollen and uncomfortable.  Continues to do physical therapy as instructed.  Interested in reviewing other options.   Objective:    Vitals:   06/03/22 1323  BP: 133/72  Pulse: 83  Resp: 20  Height: 5\' 2"  (1.575 m)  Weight: 123 lb 1.3 oz (55.8 kg)  SpO2: 97%  BMI (Calculated): 22.51    Physical Exam Vitals reviewed.  Constitutional:      General: She is not in acute distress.    Appearance: Normal appearance. She is normal weight. She is not ill-appearing.  HENT:     Head: Normocephalic and atraumatic.  Cardiovascular:     Rate and Rhythm: Normal rate and regular rhythm.     Pulses: Normal pulses.     Heart sounds: Normal heart sounds.  Pulmonary:     Effort: Pulmonary effort is normal. No respiratory distress.     Breath sounds: Normal breath sounds. No wheezing, rhonchi or rales.  Musculoskeletal:     Right knee: Swelling and effusion present.  Skin:    General: Skin is warm and dry.  Neurological:     Mental Status: She is alert and  oriented to person, place, and time.  Psychiatric:        Mood and Affect: Mood normal.        Behavior: Behavior normal.        Thought Content: Thought content normal.        Judgment: Judgment normal.   No results found for this or any previous visit (from the past 24 hour(s)).     The ASCVD Risk score (Arnett DK, et al., 2019) failed to calculate for the following reasons:   Unable to determine if patient is Non-Hispanic African American   Assessment & Plan:   1. Caregiver stress Symptoms improved.  Continue Zoloft 100 mg daily.  2. Chronic pain of right knee Recommend compression using a knee sleeve when up and ambulating.  Elevate the lower extremity when resting or at night.  Okay to continue meloxicam 15 mg daily.  Tylenol as needed.  Continue physical therapy.  Discussed referring to Dr. Dianah Field here in our office for further evaluation and treatment options.  Return in about 3 months (around 09/03/2022) for DM follow up.  ___________________________________________ Clearnce Sorrel, DNP, APRN, FNP-BC [facility] [place]

## 2022-06-14 ENCOUNTER — Other Ambulatory Visit: Payer: Self-pay | Admitting: Hematology and Oncology

## 2022-06-15 ENCOUNTER — Encounter: Payer: Medicaid Other | Admitting: Physical Therapy

## 2022-07-06 ENCOUNTER — Other Ambulatory Visit: Payer: Self-pay | Admitting: Nurse Practitioner

## 2022-07-06 DIAGNOSIS — R519 Headache, unspecified: Secondary | ICD-10-CM

## 2022-07-19 ENCOUNTER — Encounter: Payer: Medicaid Other | Admitting: Sports Medicine

## 2022-07-20 ENCOUNTER — Ambulatory Visit (INDEPENDENT_AMBULATORY_CARE_PROVIDER_SITE_OTHER): Payer: Medicaid Other | Admitting: Sports Medicine

## 2022-07-20 ENCOUNTER — Other Ambulatory Visit (INDEPENDENT_AMBULATORY_CARE_PROVIDER_SITE_OTHER): Payer: Medicaid Other

## 2022-07-20 DIAGNOSIS — G8929 Other chronic pain: Secondary | ICD-10-CM | POA: Diagnosis not present

## 2022-07-20 DIAGNOSIS — M1711 Unilateral primary osteoarthritis, right knee: Secondary | ICD-10-CM | POA: Diagnosis not present

## 2022-07-20 DIAGNOSIS — M25561 Pain in right knee: Secondary | ICD-10-CM | POA: Diagnosis not present

## 2022-07-20 NOTE — Progress Notes (Signed)
    Procedures performed today:    Procedure: Real-time Ultrasound Guided aspiration/injection of right knee Device: Samsung HS60  Verbal informed consent obtained.  Time-out conducted.  Noted no overlying erythema, induration, or other signs of local infection.  Skin prepped in a sterile fashion.  Local anesthesia: Topical Ethyl chloride.  With sterile technique and under real time ultrasound guidance: Noted effusion, aspirated 20 mL of clear, straw-colored fluid, syringe switched and 1 cc Kenalog 40, 2 cc lidocaine, 2 cc bupivacaine injected easily Completed without difficulty  Advised to call if fevers/chills, erythema, induration, drainage, or persistent bleeding.  Images permanently stored and available for review in PACS.  Impression: Technically successful ultrasound guided aspiration/injection.  Independent interpretation of notes and tests performed by another provider:   None.  Brief History, Exam, Impression, and Recommendations:    Primary osteoarthritis of right knee Very pleasant 67 year old female, chronic right knee pain, failed physical therapy, NSAIDs, icing. Aspiration and injection today, return to see me in 6 weeks, home physical therapy given.    ____________________________________________ Ihor Austin. Benjamin Stain, M.D., ABFM., CAQSM., AME. Primary Care and Sports Medicine [facility]  Adjunct Professor of Family Medicine  [place]  Restaurant manager, fast food

## 2022-07-20 NOTE — Assessment & Plan Note (Signed)
Very pleasant 67 year old female, chronic right knee pain, failed physical therapy, NSAIDs, icing. Aspiration and injection today, return to see me in 6 weeks, home physical therapy given.

## 2022-07-24 NOTE — Progress Notes (Signed)
Patient Care Team: Christen Butter, NP as PCP - General (Nurse Practitioner) Almond Lint, MD as Consulting Physician (General Surgery) Magrinat, Valentino Hue, MD (Inactive) as Consulting Physician (Oncology) Antony Blackbird, MD as Consulting Physician (Radiation Oncology) Serena Croissant, MD as Consulting Physician (Hematology and Oncology)  DIAGNOSIS: No diagnosis found.  SUMMARY OF ONCOLOGIC HISTORY: Oncology History  Malignant neoplasm of upper-inner quadrant of right breast in female, estrogen receptor positive (HCC)  07/03/2018 Initial Diagnosis   Patient felt a right breast lump, and followed up with diagnostic mammography a month later revealing a 2.7 x 1.7 x 1.9 cm mass in the right breast. Biopsy ([id number]) revealed a clinical T2 N0, stage IB invasive ductal carcinoma, grade 2, estrogen receptor strongly positive, progesterone receptor weakly positive, with no HER-2 amplification and an MIB-1 of 5%.   06/2018 - 06/2023 Anti-estrogen oral therapy   Anastrozole   07/24/2018 Genetic Testing   Negative genetic testing.  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  The Common Hereditary Gene Panel offered by [facility] includes sequencing and/or deletion duplication testing of the following 48 genes: APC, ATM, AXIN2, BARD1, BMPR1A, BRCA1, BRCA2, BRIP1, CDH1, CDK4, CDKN2A (p14ARF), CDKN2A (p16INK4a), CHEK2, CTNNA1, DICER1, EPCAM (Deletion/duplication testing only), GREM1 (promoter region deletion/duplication testing only), KIT, MEN1, MLH1, MSH2, MSH3, MSH6, MUTYH, NBN, NF1, NHTL1, PALB2, PDGFRA, PMS2, POLD1, POLE, PTEN, RAD50, RAD51C, RAD51D, RNF43, SDHB, SDHC, SDHD, SMAD4, SMARCA4. STK11, TP53, TSC1, TSC2, and VHL.  The following genes were evaluated for sequence changes only: SDHA and HOXB13 c.251G>A variant only. The report date is Jul 24, 2018.  UPDATE: BRCA1 c.5306A>G VUS was reclassified to Likely Benign as a result of re-review of the evidence in light of new variant interpretation guidelines and/or new  information. The updated report date is April 23, 2019.   08/31/2018 Cancer Staging   Staging form: Breast, AJCC 8th Edition - Pathologic stage from 08/31/2018: Stage IB (pT2, pN27mi, cM0, G2, ER+, PR+, HER2-) - Signed by Loa Socks, NP on 02/26/2019   08/31/2018 Surgery   Right lumpectomy and sentinel lymph node sampling ([id number]) for a pT2 pN0, stage IB invasive ductal carcinoma, grade 2, with close but negative margins. 1/3 lymph nodes positive for carcinoma.    08/31/2018 Oncotype testing   MammaPrint shows a luminal type low risk tumor, predicting a 97.8% chance of being alive without distant recurrence at 5 years with antiestrogen therapy alone [without chemotherapy]   08/31/2018 Oncotype testing   The Oncotype DX score was 21 predicting a risk of outside the breast recurrence over the next 9 years of 7% if the patient's only systemic therapy is tamoxifen for 5 years.   10/05/2018 - 11/22/2018 Radiation Therapy   The patient initially received a dose of 50.4 Gy in 28 fractions to the breast using whole-breast tangent fields and 45 Gy in 25 fractions to the axilla. This was delivered using a 3-D conformal technique. The pt received a boost delivering an additional 12 Gy in 6 fractions using a electron boost with electrons. The total dose was 107.4 Gy.      CHIEF COMPLIANT: Follow-up anastrozole  INTERVAL HISTORY: Emily Hendricks is a 67 y.o for follow-up of her estrogen receptor positive breast cancer. Currently on anastrozole therapy. She presents to the clinic for a follow-up.    ALLERGIES:  is allergic to lisinopril.  MEDICATIONS:  Current Outpatient Medications  Medication Sig Dispense Refill   alendronate (FOSAMAX) 70 MG tablet TAKE ONE TABLET BY MOUTH ONCE A WEEK. TAKE WITH FULL GLASS OF  WATER AND ON A EMPTY STOMACH 12 tablet 8   anastrozole (ARIMIDEX) 1 MG tablet TAKE 1 TABLET (1 MG TOTAL) BY MOUTH DAILY. MUST HAVE OFFICE VISIT FOR ADDITIONAL REFILLS 90  tablet 3   Cholecalciferol (VITAMIN D) 50 MCG (2000 UT) tablet Take 1 tablet (2,000 Units total) by mouth daily. 90 tablet 4   ferrous sulfate 325 (65 FE) MG tablet Take 1 tablet (325 mg total) by mouth daily with breakfast. 90 tablet 4   losartan (COZAAR) 50 MG tablet Take 1 tablet (50 mg total) by mouth daily. 90 tablet 1   meloxicam (MOBIC) 15 MG tablet Take 1 tablet (15 mg total) by mouth daily. 30 tablet 0   metFORMIN (GLUCOPHAGE) 500 MG tablet Take 1 tablet (500 mg total) by mouth 2 (two) times daily with a meal. 180 tablet 1   Multiple Vitamin (MULTIVITAMIN PO) Take 1 tablet by mouth daily at 6 (six) AM.     Na Sulfate-K Sulfate-Mg Sulf 17.5-3.13-1.6 GM/177ML SOLN Take by mouth.     sertraline (ZOLOFT) 100 MG tablet Take 1 tablet (100 mg total) by mouth daily. 90 tablet 3   simvastatin (ZOCOR) 20 MG tablet Take 1 tablet (20 mg total) by mouth daily. 90 tablet 1   topiramate (TOPAMAX) 25 MG tablet Take 1 tablet (25 mg total) by mouth daily. Please make PCP appointment! 30 tablet 0   No current facility-administered medications for this visit.    PHYSICAL EXAMINATION: ECOG PERFORMANCE STATUS: {CHL ONC ECOG [phone]}  There were no vitals filed for this visit. There were no vitals filed for this visit.  BREAST:*** No palpable masses or nodules in either right or left breasts. No palpable axillary supraclavicular or infraclavicular adenopathy no breast tenderness or nipple discharge. (exam performed in the presence of a chaperone)  LABORATORY DATA:  I have reviewed the data as listed    Latest Ref Rng & Units 02/10/2022    4:49 PM 07/27/2021   11:00 AM 02/09/2021    2:10 PM  CMP  Glucose 70 - 99 mg/dL 161  096  045   BUN 8 - 27 mg/dL 16  17  13    Creatinine 0.57 - 1.00 mg/dL 4.09  8.11  9.14   Sodium 134 - 144 mmol/L 141  140  140   Potassium 3.5 - 5.2 mmol/L 4.0  4.2  3.8   Chloride 96 - 106 mmol/L 104  106  106   CO2 20 - 29 mmol/L 26  28  26    Calcium 8.7 - 10.3 mg/dL  9.6  9.4  8.7   Total Protein 6.0 - 8.5 g/dL 6.9  7.2  6.9   Total Bilirubin 0.0 - 1.2 mg/dL 0.3  0.5  0.4   Alkaline Phos 44 - 121 IU/L 85  76  87   AST 0 - 40 IU/L 18  17  22    ALT 0 - 32 IU/L 13  13  29      Lab Results  Component Value Date   WBC 4.1 02/10/2022   HGB 13.1 02/10/2022   HCT 39.3 02/10/2022   MCV 90 02/10/2022   PLT 203 02/10/2022   NEUTROABS 2.2 02/10/2022    ASSESSMENT & PLAN:  No problem-specific Assessment & Plan notes found for this encounter.    No orders of the defined types were placed in this encounter.  The patient has a good understanding of the overall plan. she agrees with it. she will call with any problems that may  develop before the next visit here. Total time spent: 30 mins including face to face time and time spent for planning, charting and co-ordination of care   Sherlyn Lick, CMA 07/24/22    I Janan Ridge am acting as a Neurosurgeon for [employer]  ***

## 2022-07-28 ENCOUNTER — Other Ambulatory Visit: Payer: Self-pay

## 2022-07-28 ENCOUNTER — Inpatient Hospital Stay: Payer: Medicaid Other | Attending: Hematology and Oncology

## 2022-07-28 ENCOUNTER — Inpatient Hospital Stay (HOSPITAL_BASED_OUTPATIENT_CLINIC_OR_DEPARTMENT_OTHER): Payer: Medicaid Other | Admitting: Hematology and Oncology

## 2022-07-28 VITALS — BP 144/83 | HR 72 | Temp 97.7°F | Wt 122.7 lb

## 2022-07-28 DIAGNOSIS — R5383 Other fatigue: Secondary | ICD-10-CM | POA: Insufficient documentation

## 2022-07-28 DIAGNOSIS — Z791 Long term (current) use of non-steroidal anti-inflammatories (NSAID): Secondary | ICD-10-CM | POA: Diagnosis not present

## 2022-07-28 DIAGNOSIS — C50211 Malignant neoplasm of upper-inner quadrant of right female breast: Secondary | ICD-10-CM

## 2022-07-28 DIAGNOSIS — Z170 Estrogen receptor positive status [ER+]: Secondary | ICD-10-CM

## 2022-07-28 DIAGNOSIS — D509 Iron deficiency anemia, unspecified: Secondary | ICD-10-CM | POA: Diagnosis not present

## 2022-07-28 DIAGNOSIS — Z79811 Long term (current) use of aromatase inhibitors: Secondary | ICD-10-CM | POA: Insufficient documentation

## 2022-07-28 DIAGNOSIS — Z923 Personal history of irradiation: Secondary | ICD-10-CM | POA: Diagnosis not present

## 2022-07-28 DIAGNOSIS — Z79899 Other long term (current) drug therapy: Secondary | ICD-10-CM | POA: Insufficient documentation

## 2022-07-28 DIAGNOSIS — Z9221 Personal history of antineoplastic chemotherapy: Secondary | ICD-10-CM | POA: Insufficient documentation

## 2022-07-28 DIAGNOSIS — Z7984 Long term (current) use of oral hypoglycemic drugs: Secondary | ICD-10-CM | POA: Diagnosis not present

## 2022-07-28 LAB — CMP (CANCER CENTER ONLY)
ALT: 13 U/L (ref 0–44)
AST: 16 U/L (ref 15–41)
Albumin: 4.2 g/dL (ref 3.5–5.0)
Alkaline Phosphatase: 79 U/L (ref 38–126)
Anion gap: 5 (ref 5–15)
BUN: 18 mg/dL (ref 8–23)
CO2: 26 mmol/L (ref 22–32)
Calcium: 9 mg/dL (ref 8.9–10.3)
Chloride: 110 mmol/L (ref 98–111)
Creatinine: 0.75 mg/dL (ref 0.44–1.00)
GFR, Estimated: >60 mL/min (ref >60)
Glucose, Bld: 91 mg/dL (ref 70–99)
Potassium: 4.1 mmol/L (ref 3.5–5.1)
Sodium: 141 mmol/L (ref 135–145)
Total Bilirubin: 0.5 mg/dL (ref 0.3–1.2)
Total Protein: 6.9 g/dL (ref 6.5–8.1)

## 2022-07-28 LAB — CBC WITH DIFFERENTIAL (CANCER CENTER ONLY)
Abs Immature Granulocytes: 0.01 10*3/uL (ref 0.00–0.07)
Basophils Absolute: 0 10*3/uL (ref 0.0–0.1)
Basophils Relative: 0 %
Eosinophils Absolute: 0.1 10*3/uL (ref 0.0–0.5)
Eosinophils Relative: 2 %
HCT: 40.3 % (ref 36.0–46.0)
Hemoglobin: 12.7 g/dL (ref 12.0–15.0)
Immature Granulocytes: 0 %
Lymphocytes Relative: 30 %
Lymphs Abs: 1.3 10*3/uL (ref 0.7–4.0)
MCH: 29.1 pg (ref 26.0–34.0)
MCHC: 31.5 g/dL (ref 30.0–36.0)
MCV: 92.2 fL (ref 80.0–100.0)
Monocytes Absolute: 0.3 10*3/uL (ref 0.1–1.0)
Monocytes Relative: 8 %
Neutro Abs: 2.6 10*3/uL (ref 1.7–7.7)
Neutrophils Relative %: 60 %
Platelet Count: 202 10*3/uL (ref 150–400)
RBC: 4.37 MIL/uL (ref 3.87–5.11)
RDW: 12.5 % (ref 11.5–15.5)
WBC Count: 4.4 10*3/uL (ref 4.0–10.5)
nRBC: 0 % (ref 0.0–0.2)

## 2022-07-28 LAB — IRON AND IRON BINDING CAPACITY (CC-WL,HP ONLY)
Iron: 97 ug/dL (ref 28–170)
Saturation Ratios: 24 % (ref 10.4–31.8)
TIBC: 399 ug/dL (ref 250–450)
UIBC: 302 ug/dL (ref 148–442)

## 2022-07-28 MED ORDER — ANASTROZOLE 1 MG PO TABS
ORAL_TABLET | ORAL | 3 refills | Status: DC
Start: 2022-07-28 — End: 2023-08-01

## 2022-07-28 NOTE — Assessment & Plan Note (Addendum)
07/03/2018: T2N0 stage Ib grade 2 IDC ER positive, PR weakly positive, HER2 negative, Ki-67 5% 07/12/2018: Neoadjuvant anastrozole 08/31/2018: Right lumpectomy: T2N0 stage Ib grade 2 IDC, negative margins, 0/3 lymph nodes negative, MammaPrint low risk, genetics: Negative   Breast cancer surveillance: 1.  Breast exam 07/28/2022: Benign 2. mammogram 04/21/2022: Benign breast density category C Bone density: October 2021: Osteoporosis T score -3.1: I sent a prescription for Fosamax today and encouraged her to take calcium and vitamin D.   Iron deficiency anemia 02/09/2021: Hemoglobin 12, MCV 90.6, 12% iron saturation, ferritin 259 07/27/2021: Hemoglobin 12.8, MCV 93.9, TIBC 395, iron saturation 24%, CMP normal 02/10/2022: Hemoglobin 13.1, MCV 90 07/28/2022: Hemoglobin 12.7, MCV 92.2  IV iron: June 2022 Since she has not had any further issues with iron deficiency we can stop checking her labs for next year onwards.  Recheck labs in 1 year and follow-up .  And after that she could be seen on an as-needed basis.

## 2022-08-04 ENCOUNTER — Other Ambulatory Visit: Payer: Self-pay | Admitting: Hematology and Oncology

## 2022-08-06 ENCOUNTER — Other Ambulatory Visit (HOSPITAL_COMMUNITY): Payer: Self-pay

## 2022-08-18 ENCOUNTER — Other Ambulatory Visit: Payer: Self-pay | Admitting: Nurse Practitioner

## 2022-08-18 DIAGNOSIS — E1165 Type 2 diabetes mellitus with hyperglycemia: Secondary | ICD-10-CM

## 2022-08-18 DIAGNOSIS — I10 Essential (primary) hypertension: Secondary | ICD-10-CM

## 2022-09-01 ENCOUNTER — Other Ambulatory Visit: Payer: Self-pay | Admitting: Family Medicine

## 2022-09-01 ENCOUNTER — Other Ambulatory Visit: Payer: Self-pay | Admitting: Nurse Practitioner

## 2022-09-01 DIAGNOSIS — I10 Essential (primary) hypertension: Secondary | ICD-10-CM

## 2022-09-01 DIAGNOSIS — R519 Headache, unspecified: Secondary | ICD-10-CM

## 2022-09-02 ENCOUNTER — Ambulatory Visit: Payer: Medicaid Other | Admitting: Medical-Surgical

## 2022-09-02 DIAGNOSIS — E119 Type 2 diabetes mellitus without complications: Secondary | ICD-10-CM

## 2022-09-03 ENCOUNTER — Ambulatory Visit: Payer: Medicaid Other | Admitting: Medical-Surgical

## 2022-09-08 ENCOUNTER — Other Ambulatory Visit: Payer: Self-pay | Admitting: Nurse Practitioner

## 2022-09-08 DIAGNOSIS — E785 Hyperlipidemia, unspecified: Secondary | ICD-10-CM

## 2022-09-08 DIAGNOSIS — I10 Essential (primary) hypertension: Secondary | ICD-10-CM

## 2022-09-09 ENCOUNTER — Other Ambulatory Visit: Payer: Self-pay | Admitting: Family Medicine

## 2022-09-09 ENCOUNTER — Telehealth: Payer: Self-pay | Admitting: Medical-Surgical

## 2022-09-09 DIAGNOSIS — I10 Essential (primary) hypertension: Secondary | ICD-10-CM

## 2022-09-09 DIAGNOSIS — R519 Headache, unspecified: Secondary | ICD-10-CM

## 2022-09-09 MED ORDER — TOPIRAMATE 25 MG PO TABS: 25.0000 mg | ORAL_TABLET | Freq: Every day | ORAL | 0 refills | Status: DC

## 2022-09-09 MED ORDER — LOSARTAN POTASSIUM 50 MG PO TABS: 50.0000 mg | ORAL_TABLET | Freq: Every day | ORAL | 0 refills | Status: DC

## 2022-09-09 NOTE — Telephone Encounter (Signed)
Patient is requesting refills on losarton 50mg , topiramate 25mg   Please submit to [facility] pharmacy [address] [place] [phone]

## 2022-09-09 NOTE — Telephone Encounter (Signed)
Prescriptions have been sent in.   Thanks!  CM

## 2022-09-13 ENCOUNTER — Ambulatory Visit (INDEPENDENT_AMBULATORY_CARE_PROVIDER_SITE_OTHER): Payer: Medicaid Other | Admitting: Medical-Surgical

## 2022-09-13 ENCOUNTER — Encounter: Payer: Self-pay | Admitting: Medical-Surgical

## 2022-09-13 VITALS — BP 121/70 | HR 76 | Resp 20 | Ht 62.0 in | Wt 124.1 lb

## 2022-09-13 DIAGNOSIS — R519 Headache, unspecified: Secondary | ICD-10-CM | POA: Diagnosis not present

## 2022-09-13 DIAGNOSIS — I10 Essential (primary) hypertension: Secondary | ICD-10-CM | POA: Diagnosis not present

## 2022-09-13 DIAGNOSIS — Z23 Encounter for immunization: Secondary | ICD-10-CM

## 2022-09-13 DIAGNOSIS — E785 Hyperlipidemia, unspecified: Secondary | ICD-10-CM

## 2022-09-13 DIAGNOSIS — Z636 Dependent relative needing care at home: Secondary | ICD-10-CM

## 2022-09-13 DIAGNOSIS — Z7984 Long term (current) use of oral hypoglycemic drugs: Secondary | ICD-10-CM | POA: Diagnosis not present

## 2022-09-13 DIAGNOSIS — E1165 Type 2 diabetes mellitus with hyperglycemia: Secondary | ICD-10-CM

## 2022-09-13 LAB — POCT UA - MICROALBUMIN
Albumin/Creatinine Ratio, Urine, POC: <30
Creatinine, POC: 100 mg/dL
Microalbumin Ur, POC: 30 mg/L

## 2022-09-13 LAB — POCT GLYCOSYLATED HEMOGLOBIN (HGB A1C)
HbA1c, POC (prediabetic range): 6 % (ref 5.7–6.4)
Hemoglobin A1C: 6 % — AB (ref 4.0–5.6)

## 2022-09-13 MED ORDER — TOPIRAMATE 25 MG PO TABS
25.0000 mg | ORAL_TABLET | Freq: Every day | ORAL | 3 refills | Status: DC
Start: 2022-09-13 — End: 2023-09-20

## 2022-09-13 MED ORDER — ALENDRONATE SODIUM 70 MG PO TABS: ORAL_TABLET | ORAL | 8 refills | Status: DC

## 2022-09-13 MED ORDER — SIMVASTATIN 20 MG PO TABS
20.0000 mg | ORAL_TABLET | Freq: Every day | ORAL | 3 refills | Status: DC
Start: 2022-09-13 — End: 2023-09-20

## 2022-09-13 MED ORDER — MELOXICAM 15 MG PO TABS: 15.0000 mg | ORAL_TABLET | Freq: Every day | ORAL | 2 refills | Status: DC

## 2022-09-13 MED ORDER — METFORMIN HCL 500 MG PO TABS
500.0000 mg | ORAL_TABLET | Freq: Two times a day (BID) | ORAL | 3 refills | Status: DC
Start: 2022-09-13 — End: 2023-10-06

## 2022-09-13 MED ORDER — LOSARTAN POTASSIUM 50 MG PO TABS
50.0000 mg | ORAL_TABLET | Freq: Every day | ORAL | 3 refills | Status: DC
Start: 2022-09-13 — End: 2023-11-16

## 2022-09-13 MED ORDER — SERTRALINE HCL 100 MG PO TABS: 100.0000 mg | ORAL_TABLET | Freq: Every day | ORAL | 3 refills | Status: DC

## 2022-09-13 NOTE — Progress Notes (Signed)
        Established patient visit  History, exam, impression, and plan:  1. Type 2 diabetes mellitus with hyperglycemia, without long-term current use of insulin (HCC) Pleasant 67 year old female presenting today accompanied by her daughter who serves as interpreter and assists with history.  Currently taking metformin 500 mg twice daily with meals as prescribed, tolerating well without side effects.  Hemoglobin A1c checked 6 months ago at 5.9%.  Has not been checking sugars at home.  Repeat POCT hemoglobin A1c 6.0% today.  Microalbumin normal today.  Overdue for eye exam.  Recommend updating diabetic eye screening at the earliest convenience.  Continue metformin as prescribed. - POCT HgB A1C - POCT UA - Microalbumin - metFORMIN (GLUCOPHAGE) 500 MG tablet; Take 1 tablet (500 mg total) by mouth 2 (two) times daily with a meal.  Dispense: 180 tablet; Refill: 3  2. Essential hypertension History of hypertension well-controlled on losartan 50 mg daily.  Monitor blood pressure occasionally at home with readings reported at goal.  Denies concerning symptoms.  Following a low-sodium diet.  Stays physically active but no regular intentional exercise.  Cardiopulmonary exam normal.  Blood pressure at goal today.  Continue losartan 50 mg daily as prescribed. - losartan (COZAAR) 50 MG tablet; Take 1 tablet (50 mg total) by mouth daily.  Dispense: 90 tablet; Refill: 3  3. Caregiver stress Taking Zoloft 100 mg daily, tolerating well without side effects.  Feels medication works very well to help keep her stress levels controlled.  Denies SI/HI.  Sleeping well.  Mood, affect, thought pattern, and cognition normal.  Continue Zoloft as prescribed. - sertraline (ZOLOFT) 100 MG tablet; Take 1 tablet (100 mg total) by mouth daily.  Dispense: 90 tablet; Refill: 3  4. Dyslipidemia, goal LDL below 100 Taking simvastatin 20 mg daily, tolerating well without side effects.  Following a low-fat heart healthy diet.  Lipids  checked less than 1 year ago.  Plan to recheck at next follow-up.  Continue simvastatin as prescribed. - simvastatin (ZOCOR) 20 MG tablet; Take 1 tablet (20 mg total) by mouth daily.  Dispense: 90 tablet; Refill: 3  5. Frontal headache Has been doing much better on Topamax 25 mg once daily.  Her headaches have improved and she is tolerating the medication well without side effects.  Continue Topamax 25 mg daily. - topiramate (TOPAMAX) 25 MG tablet; Take 1 tablet (25 mg total) by mouth daily.  Dispense: 90 tablet; Refill: 3  6. Need for shingles vaccine Discussed recommendations for vaccination.  She is currently unvaccinated for shingles.  On Medicaid for insurance rather than Medicare.  We did discuss recommendations for the 2 shot series.  She is amenable to starting the series today.  Shingrix No. 1 given in office today.  Plan for Shingrix No. 2 in 6 months as long as she is not on Medicare at that time. - Zoster Recombinant (Shingrix )  7. Need for vaccination with 13-polyvalent pneumococcal conjugate vaccine Prevnar 13 given in office today.  Plan for Pneumovax 23 or 20 in 1 year. - Pneumococcal conjugate vaccine 13-valent  Procedures performed this visit: None.  Return in about 6 months (around 03/15/2023) for DM/HTN/HLD follow up.  __________________________________ Thayer Ohm, DNP, APRN, FNP-BC Primary Care and Sports Medicine [facility] [place]

## 2022-09-15 ENCOUNTER — Telehealth: Payer: Self-pay | Admitting: Medical-Surgical

## 2022-09-15 NOTE — Telephone Encounter (Signed)
Patient's daughter called to check the status of medications losartan 50mg  and topamax 25mg 

## 2022-09-15 NOTE — Telephone Encounter (Signed)
Both losartan 50 mg daily and Topamax 25 mg daily were sent for a 90-day supply with 3 refills on 09/13/2022 to [facility].  Both medications received confirmation of receipt.  Recommend contacting the pharmacy to have these ready for pickup.  ___________________________________________ Thayer Ohm, DNP, APRN, FNP-BC Primary Care and Sports Medicine [facility] [place]

## 2022-09-20 NOTE — Telephone Encounter (Signed)
Spoke with patient and informed.

## 2022-11-09 ENCOUNTER — Other Ambulatory Visit (HOSPITAL_COMMUNITY): Payer: Self-pay

## 2022-11-09 ENCOUNTER — Other Ambulatory Visit: Payer: Self-pay | Admitting: Medical-Surgical

## 2022-11-09 DIAGNOSIS — Z636 Dependent relative needing care at home: Secondary | ICD-10-CM

## 2022-11-10 ENCOUNTER — Other Ambulatory Visit (HOSPITAL_COMMUNITY): Payer: Self-pay

## 2022-11-12 ENCOUNTER — Other Ambulatory Visit: Payer: Self-pay | Admitting: Medical-Surgical

## 2022-11-12 ENCOUNTER — Other Ambulatory Visit (HOSPITAL_COMMUNITY): Payer: Self-pay

## 2022-11-12 DIAGNOSIS — Z636 Dependent relative needing care at home: Secondary | ICD-10-CM

## 2022-11-15 ENCOUNTER — Other Ambulatory Visit (HOSPITAL_COMMUNITY): Payer: Self-pay

## 2022-11-17 ENCOUNTER — Other Ambulatory Visit: Payer: Self-pay | Admitting: Medical-Surgical

## 2022-12-01 DIAGNOSIS — H7291 Unspecified perforation of tympanic membrane, right ear: Secondary | ICD-10-CM | POA: Diagnosis not present

## 2022-12-10 ENCOUNTER — Encounter: Payer: Self-pay | Admitting: Emergency Medicine

## 2022-12-10 ENCOUNTER — Ambulatory Visit
Admission: EM | Admit: 2022-12-10 | Discharge: 2022-12-10 | Disposition: A | Discharge status: Home / Self Care | Payer: Medicaid Other | Attending: Physician Assistant | Admitting: Physician Assistant

## 2022-12-10 DIAGNOSIS — U071 COVID-19: Secondary | ICD-10-CM | POA: Diagnosis not present

## 2022-12-10 LAB — POC SARS CORONAVIRUS 2 AG -  ED: SARS Coronavirus 2 Ag: POSITIVE — AB

## 2022-12-10 NOTE — ED Provider Notes (Signed)
[facility]    CSN: [id number] Arrival date & time: 12/10/22  0909      History   Chief Complaint Chief Complaint  Patient presents with   Cough   Generalized Body Aches    HPI Emily Hendricks is a 67 y.o. female.   Pt complains of a cough and congestion.  Pt reports she has had a fever. No shortness of breath.  Pt reports decreased appetitive.   The history is provided by the patient. No language interpreter was used.  Cough Cough characteristics:  Non-productive Sputum characteristics:  Nondescript Progression:  Worsening Chronicity:  New Relieved by:  Nothing Ineffective treatments:  None tried Associated symptoms: fever   Associated symptoms: no chest pain     Past Medical History:  Diagnosis Date   Breast cancer (HCC) 2020   mastectomy   Diabetes mellitus without complication (HCC)    on meds   Family history of cancer    Hyperlipidemia    on meds   Hypertension    on meds   Personal history of radiation therapy     Patient Active Problem List   Diagnosis Date Noted   Type 2 diabetes mellitus without complication, without long-term current use of insulin (HCC) 03/17/2022   Perforation of right tympanic membrane 03/17/2022   Primary osteoarthritis of right knee 03/17/2022   Essential hypertension 09/17/2020   Dyslipidemia 09/17/2020   Pre-diabetes 09/17/2020   Absolute anemia 08/12/2020   Iron deficiency anemia 08/12/2020   Genetic testing 07/24/2018   Family history of cancer    Malignant neoplasm of upper-inner quadrant of right breast in female, estrogen receptor positive (HCC) 07/06/2018    Past Surgical History:  Procedure Laterality Date   BREAST LUMPECTOMY     BREAST LUMPECTOMY WITH RADIOACTIVE SEED AND SENTINEL LYMPH NODE BIOPSY Right 08/31/2018   Procedure: RIGHT BREAST LUMPECTOMY WITH RADIOACTIVE SEED AND SENTINEL LYMPH NODE BIOPSY;  Surgeon: Almond Lint, MD;  Location: [facility];  Service: General;   Laterality: Right;   COLONOSCOPY  2018   HD-MAC-supre(fair) with lavage-TA x 6   POLYPECTOMY  2018   TA x 6   TUBAL LIGATION      OB History     Gravida  3   Para      Term      Preterm      AB  1   Living  2      SAB      IAB  1   Ectopic      Multiple      Live Births  2            Home Medications    Prior to Admission medications   Medication Sig Start Date End Date Taking? Authorizing Provider  alendronate (FOSAMAX) 70 MG tablet TAKE ONE TABLET BY MOUTH ONCE A WEEK. TAKE WITH FULL GLASS OF WATER AND ON A EMPTY STOMACH 09/13/22   Christen Butter, NP  anastrozole (ARIMIDEX) 1 MG tablet TAKE 1 TABLET (1 MG TOTAL) BY MOUTH DAILY. MUST HAVE OFFICE VISIT FOR ADDITIONAL REFILLS 07/28/22   Serena Croissant, MD  ferrous sulfate 325 (65 FE) MG tablet Take 1 tablet (325 mg total) by mouth daily with breakfast. 03/18/21   Claiborne Rigg, NP  losartan (COZAAR) 50 MG tablet Take 1 tablet (50 mg total) by mouth daily. 09/13/22   Christen Butter, NP  meloxicam (MOBIC) 15 MG tablet TAKE ONE TABLET BY MOUTH DAILY 11/17/22   Larinda Buttery,  Joy, NP  metFORMIN (GLUCOPHAGE) 500 MG tablet Take 1 tablet (500 mg total) by mouth 2 (two) times daily with a meal. 09/13/22   Christen Butter, NP  Multiple Vitamin (MULTIVITAMIN PO) Take 1 tablet by mouth daily at 6 (six) AM.    [provider]  Na Sulfate-K Sulfate-Mg Sulf 17.5-3.13-1.6 GM/177ML SOLN Take by mouth. 03/30/21   [provider]  sertraline (ZOLOFT) 100 MG tablet Take 1 tablet (100 mg total) by mouth daily. 09/13/22   Christen Butter, NP  simvastatin (ZOCOR) 20 MG tablet Take 1 tablet (20 mg total) by mouth daily. 09/13/22   Christen Butter, NP  topiramate (TOPAMAX) 25 MG tablet Take 1 tablet (25 mg total) by mouth daily. 09/13/22   Christen Butter, NP    Family History Family History  Problem Relation Age of Onset   Cancer Mother 54       unknown cancer, in her lower abdomen   Cancer Sister 45       unknown cancer in her lower abdomen    Heart disease Brother    Ovarian cancer Maternal Grandmother    Cancer Niece        unknown form of cancer   Colon polyps Neg Hx    Colon cancer Neg Hx    Esophageal cancer Neg Hx    Rectal cancer Neg Hx    Stomach cancer Neg Hx     Social History Social History   Tobacco Use   Smoking status: Never   Smokeless tobacco: Never  Vaping Use   Vaping status: Never Used  Substance Use Topics   Alcohol use: No   Drug use: No     Allergies   Lisinopril   Review of Systems Review of Systems  Constitutional:  Positive for fever.  Respiratory:  Positive for cough.   Cardiovascular:  Negative for chest pain.  All other systems reviewed and are negative.    Physical Exam Triage Vital Signs ED Triage Vitals  Encounter Vitals Group     BP 12/10/22 1010 (!) 152/85     Systolic BP Percentile --      Diastolic BP Percentile --      Pulse Rate 12/10/22 1010 93     Resp 12/10/22 1010 16     Temp 12/10/22 1010 97.9 F (36.6 C)     Temp Source 12/10/22 1010 Oral     SpO2 12/10/22 1010 98 %     Weight --      Height --      Head Circumference --      Peak Flow --      Pain Score 12/10/22 1013 5     Pain Loc --      Pain Education --      Exclude from Growth Chart --    No data found.  Updated Vital Signs BP (!) 152/85 (BP Location: Right Arm)   Pulse 93   Temp 97.9 F (36.6 C) (Oral)   Resp 16   SpO2 98%   Visual Acuity Right Eye Distance:   Left Eye Distance:   Bilateral Distance:    Right Eye Near:   Left Eye Near:    Bilateral Near:     Physical Exam Vitals and nursing note reviewed.  Constitutional:      General: She is not in acute distress.    Appearance: She is well-developed.  HENT:     Head: Normocephalic and atraumatic.  Eyes:     Conjunctiva/sclera: Conjunctivae  normal.  Cardiovascular:     Rate and Rhythm: Normal rate and regular rhythm.     Heart sounds: No murmur heard. Pulmonary:     Effort: Pulmonary effort is normal. No  respiratory distress.     Breath sounds: Normal breath sounds.  Abdominal:     Palpations: Abdomen is soft.     Tenderness: There is no abdominal tenderness.  Musculoskeletal:        General: No swelling.     Cervical back: Neck supple.  Skin:    General: Skin is warm and dry.     Capillary Refill: Capillary refill takes less than 2 seconds.  Neurological:     Mental Status: She is alert.  Psychiatric:        Mood and Affect: Mood normal.      UC Treatments / Results  Labs (all labs ordered are listed, but only abnormal results are displayed) Labs Reviewed  POC SARS CORONAVIRUS 2 AG -  ED - Abnormal; Notable for the following components:      Result Value   SARS Coronavirus 2 Ag Positive (*)    All other components within normal limits    EKG   Radiology No results found.  Procedures Procedures (including critical care time)  Medications Ordered in UC Medications - No data to display  Initial Impression / Assessment and Plan / UC Course  I have reviewed the triage vital signs and the nursing notes.  Pertinent labs & imaging results that were available during my care of the patient were reviewed by me and considered in my medical decision making (see chart for details).     COVId is positive Pt counseled on symptomatic care  Final Clinical Impressions(s) / UC Diagnoses   Final diagnoses:  COVID     Discharge Instructions      Tylenol every 4 hours for bodyaches.  Drink plenty of fluids    ED Prescriptions   None    PDMP not reviewed this encounter. An After Visit Summary was printed and given to the patient.       Elson Areas, [place] 12/10/22 1050

## 2022-12-10 NOTE — ED Triage Notes (Addendum)
Generalized body aches, headaches, and a dry cough since Tuesday. Using dayquil, nyquil, and tylenol. Has been eating and drinking well until today when she began to feel nauseous. Having trouble sleeping at night d/t cough. Denies dysuria, leg swelling, did not check temp at home, no at-home testing.  Patient speaks hindi, but she and her daughter request that the daughter interprets

## 2022-12-10 NOTE — Discharge Instructions (Addendum)
Tylenol every 4 hours for bodyaches.  Drink plenty of fluids

## 2023-01-12 ENCOUNTER — Other Ambulatory Visit: Payer: Self-pay | Admitting: Medical-Surgical

## 2023-02-08 ENCOUNTER — Other Ambulatory Visit: Payer: Self-pay | Admitting: Medical-Surgical

## 2023-03-28 ENCOUNTER — Other Ambulatory Visit: Payer: Self-pay | Admitting: Medical-Surgical

## 2023-05-25 ENCOUNTER — Other Ambulatory Visit: Payer: Self-pay | Admitting: Medical-Surgical

## 2023-06-11 ENCOUNTER — Other Ambulatory Visit: Payer: Self-pay | Admitting: Medical-Surgical

## 2023-08-01 ENCOUNTER — Inpatient Hospital Stay: Payer: Medicaid Other | Attending: Hematology and Oncology | Admitting: Hematology and Oncology

## 2023-08-01 VITALS — BP 138/82 | HR 66 | Temp 97.6°F | Resp 18 | Ht 62.0 in | Wt 118.3 lb

## 2023-08-01 DIAGNOSIS — Z170 Estrogen receptor positive status [ER+]: Secondary | ICD-10-CM | POA: Diagnosis not present

## 2023-08-01 DIAGNOSIS — Z79899 Other long term (current) drug therapy: Secondary | ICD-10-CM | POA: Insufficient documentation

## 2023-08-01 DIAGNOSIS — Z79811 Long term (current) use of aromatase inhibitors: Secondary | ICD-10-CM | POA: Insufficient documentation

## 2023-08-01 DIAGNOSIS — D509 Iron deficiency anemia, unspecified: Secondary | ICD-10-CM | POA: Diagnosis not present

## 2023-08-01 DIAGNOSIS — Z7984 Long term (current) use of oral hypoglycemic drugs: Secondary | ICD-10-CM | POA: Diagnosis not present

## 2023-08-01 DIAGNOSIS — M810 Age-related osteoporosis without current pathological fracture: Secondary | ICD-10-CM | POA: Insufficient documentation

## 2023-08-01 DIAGNOSIS — Z9221 Personal history of antineoplastic chemotherapy: Secondary | ICD-10-CM | POA: Insufficient documentation

## 2023-08-01 DIAGNOSIS — Z791 Long term (current) use of non-steroidal anti-inflammatories (NSAID): Secondary | ICD-10-CM | POA: Insufficient documentation

## 2023-08-01 DIAGNOSIS — C50211 Malignant neoplasm of upper-inner quadrant of right female breast: Secondary | ICD-10-CM | POA: Diagnosis not present

## 2023-08-01 DIAGNOSIS — Z923 Personal history of irradiation: Secondary | ICD-10-CM | POA: Diagnosis not present

## 2023-08-01 DIAGNOSIS — Z780 Asymptomatic menopausal state: Secondary | ICD-10-CM

## 2023-08-01 NOTE — Progress Notes (Signed)
 Patient Care Team: Cherre Cornish, NP as PCP - General (Nurse Practitioner) Lockie Rima, MD as Consulting Physician (General Surgery) Magrinat, Rozella Cornfield, MD (Inactive) as Consulting Physician (Oncology) Retta Caster, MD as Consulting Physician (Radiation Oncology) Cameron Cea, MD as Consulting Physician (Hematology and Oncology)  DIAGNOSIS:  Encounter Diagnosis  Name Primary?   Malignant neoplasm of upper-inner quadrant of right breast in female, estrogen receptor positive (HCC) Yes    SUMMARY OF ONCOLOGIC HISTORY: Oncology History  Malignant neoplasm of upper-inner quadrant of right breast in female, estrogen receptor positive (HCC)  07/03/2018 Initial Diagnosis   Patient felt a right breast lump, and followed up with diagnostic mammography a month later revealing a 2.7 x 1.7 x 1.9 cm mass in the right breast. Biopsy ([id number]) revealed a clinical T2 N0, stage IB invasive ductal carcinoma, grade 2, estrogen receptor strongly positive, progesterone receptor weakly positive, with no HER-2 amplification and an MIB-1 of 5%.   06/2018 - 06/2023 Anti-estrogen oral therapy   Anastrozole    07/24/2018 Genetic Testing   Negative genetic testing.  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  The Common Hereditary Gene Panel offered by [facility] includes sequencing and/or deletion duplication testing of the following 48 genes: APC, ATM, AXIN2, BARD1, BMPR1A, BRCA1, BRCA2, BRIP1, CDH1, CDK4, CDKN2A (p14ARF), CDKN2A (p16INK4a), CHEK2, CTNNA1, DICER1, EPCAM (Deletion/duplication testing only), GREM1 (promoter region deletion/duplication testing only), KIT, MEN1, MLH1, MSH2, MSH3, MSH6, MUTYH, NBN, NF1, NHTL1, PALB2, PDGFRA, PMS2, POLD1, POLE, PTEN, RAD50, RAD51C, RAD51D, RNF43, SDHB, SDHC, SDHD, SMAD4, SMARCA4. STK11, TP53, TSC1, TSC2, and VHL.  The following genes were evaluated for sequence changes only: SDHA and HOXB13 c.251G>A variant only. The report date is Jul 24, 2018.  UPDATE: BRCA1 c.5306A>G VUS was  reclassified to Likely Benign as a result of re-review of the evidence in light of new variant interpretation guidelines and/or new information. The updated report date is April 23, 2019.   08/31/2018 Cancer Staging   Staging form: Breast, AJCC 8th Edition - Pathologic stage from 08/31/2018: Stage IB (pT2, pN45mi, cM0, G2, ER+, PR+, HER2-) - Signed by Percival Brace, NP on 02/26/2019   08/31/2018 Surgery   Right lumpectomy and sentinel lymph node sampling ([id number]) for a pT2 pN0, stage IB invasive ductal carcinoma, grade 2, with close but negative margins. 1/3 lymph nodes positive for carcinoma.    08/31/2018 Oncotype testing   MammaPrint shows a luminal type low risk tumor, predicting a 97.8% chance of being alive without distant recurrence at 5 years with antiestrogen therapy alone [without chemotherapy]   08/31/2018 Oncotype testing   The Oncotype DX score was 21 predicting a risk of outside the breast recurrence over the next 9 years of 7% if the patient's only systemic therapy is tamoxifen for 5 years.   10/05/2018 - 11/22/2018 Radiation Therapy   The patient initially received a dose of 50.4 Gy in 28 fractions to the breast using whole-breast tangent fields and 45 Gy in 25 fractions to the axilla. This was delivered using a 3-D conformal technique. The pt received a boost delivering an additional 12 Gy in 6 fractions using a electron boost with electrons. The total dose was 107.4 Gy.      CHIEF COMPLIANT: Surveillance of breast cancer  HISTORY OF PRESENT ILLNESS:   History of Present Illness Emily Hendricks is a 68 year old female with breast cancer who presents for a follow-up visit.  She is five years post-diagnosis of breast cancer and has been on anastrozole  since July 2020, completing  the required five years of treatment. No new symptoms related to breast cancer are present. She has not had a mammogram this year due to a misunderstanding about the timing and plans  to schedule it soon. She has osteoporosis, with her last bone density scan in 2021, and requires a new scan to assess her current bone health. Her appetite has changed, with more frequent but smaller meals, and she has experienced weight loss compared to previous years.     ALLERGIES:  is allergic to lisinopril .  MEDICATIONS:  Current Outpatient Medications  Medication Sig Dispense Refill   alendronate  (FOSAMAX ) 70 MG tablet TAKE ONE TABLET BY MOUTH ONCE A WEEK. TAKE WITH FULL GLASS OF WATER AND ON A EMPTY STOMACH 12 tablet 8   anastrozole  (ARIMIDEX ) 1 MG tablet TAKE 1 TABLET (1 MG TOTAL) BY MOUTH DAILY. MUST HAVE OFFICE VISIT FOR ADDITIONAL REFILLS 90 tablet 3   ferrous sulfate  325 (65 FE) MG tablet Take 1 tablet (325 mg total) by mouth daily with breakfast. 90 tablet 4   losartan  (COZAAR ) 50 MG tablet Take 1 tablet (50 mg total) by mouth daily. 90 tablet 3   meloxicam  (MOBIC ) 15 MG tablet TAKE 1 TABLET (15 MG TOTAL) BY MOUTH DAILY. NEEDS APPOINTMENT FOR FURTHER REFILLS 7 tablet 0   metFORMIN  (GLUCOPHAGE ) 500 MG tablet Take 1 tablet (500 mg total) by mouth 2 (two) times daily with a meal. 180 tablet 3   Multiple Vitamin (MULTIVITAMIN PO) Take 1 tablet by mouth daily at 6 (six) AM.     Na Sulfate-K Sulfate-Mg Sulf 17.5-3.13-1.6 GM/177ML SOLN Take by mouth.     sertraline  (ZOLOFT ) 100 MG tablet Take 1 tablet (100 mg total) by mouth daily. 90 tablet 3   simvastatin  (ZOCOR ) 20 MG tablet Take 1 tablet (20 mg total) by mouth daily. 90 tablet 3   topiramate  (TOPAMAX ) 25 MG tablet Take 1 tablet (25 mg total) by mouth daily. 90 tablet 3   No current facility-administered medications for this visit.    PHYSICAL EXAMINATION: ECOG PERFORMANCE STATUS: 1 - Symptomatic but completely ambulatory  Vitals:   08/01/23 1013  BP: 138/82  Pulse: 66  Resp: 18  Temp: 97.6 F (36.4 C)  SpO2: 100%   Filed Weights   08/01/23 1013  Weight: 118 lb 4.8 oz (53.7 kg)    Physical Exam No palpable lumps or  nodules in bilateral breasts or axilla  (exam performed in the presence of a chaperone)  LABORATORY DATA:  I have reviewed the data as listed    Latest Ref Rng & Units 07/28/2022   10:53 AM 02/10/2022    4:49 PM 07/27/2021   11:00 AM  CMP  Glucose 70 - 99 mg/dL 91  604  540   BUN 8 - 23 mg/dL 18  16  17    Creatinine 0.44 - 1.00 mg/dL 9.81  1.91  4.78   Sodium 135 - 145 mmol/L 141  141  140   Potassium 3.5 - 5.1 mmol/L 4.1  4.0  4.2   Chloride 98 - 111 mmol/L 110  104  106   CO2 22 - 32 mmol/L 26  26  28    Calcium 8.9 - 10.3 mg/dL 9.0  9.6  9.4   Total Protein 6.5 - 8.1 g/dL 6.9  6.9  7.2   Total Bilirubin 0.3 - 1.2 mg/dL 0.5  0.3  0.5   Alkaline Phos 38 - 126 U/L 79  85  76   AST 15 - 41  U/L 16  18  17    ALT 0 - 44 U/L 13  13  13      Lab Results  Component Value Date   WBC 4.4 07/28/2022   HGB 12.7 07/28/2022   HCT 40.3 07/28/2022   MCV 92.2 07/28/2022   PLT 202 07/28/2022   NEUTROABS 2.6 07/28/2022    ASSESSMENT & PLAN:  Malignant neoplasm of upper-inner quadrant of right breast in female, estrogen receptor positive (HCC) 07/03/2018: T2N0 stage Ib grade 2 IDC ER positive, PR weakly positive, HER2 negative, Ki-67 5% 07/12/2018: Neoadjuvant anastrozole  08/31/2018: Right lumpectomy: T2N0 stage Ib grade 2 IDC, negative margins, 0/3 lymph nodes negative, MammaPrint low risk, genetics: Negative   Breast cancer surveillance: 1.  Breast exam 07/28/2022: Benign 2. mammogram 04/21/2022: Benign breast density category C Bone density: October 2021: Osteoporosis T score -3.1: I sent a prescription for Fosamax  today and encouraged her to take calcium and vitamin D .   Iron  deficiency anemia 02/09/2021: Hemoglobin 12, MCV 90.6, 12% iron  saturation, ferritin 259 07/27/2021: Hemoglobin 12.8, MCV 93.9, TIBC 395, iron  saturation 24%, CMP normal 02/10/2022: Hemoglobin 13.1, MCV 90 07/28/2022: Hemoglobin 12.7, MCV 92.2   IV iron : June 2022 Since she has not had any further issues with iron   deficiency we can stop checking her labs for next year onwards.     ------------------------------------- Assessment and Plan Assessment & Plan Malignant neoplasm of upper-inner quadrant of right breast Five years post-diagnosis of estrogen receptor-positive breast cancer. Transitioning to as-needed follow-up with primary care after five years, with continued annual mammograms. - Order mammogram at [facility]. - Transition to as-needed follow-up with primary care. - Ensure annual mammograms are performed.  Osteoporosis Anastrozole , used for almost five years, can exacerbate osteoporosis. Given her low risk and completion of the minimum five-year course, discontinuing anastrozole  is appropriate to prevent further bone density loss. - Discontinue anastrozole  after completing current supply. - Order bone density scan. - Encourage daily exercise, such as walking for 30 minutes, to improve bone strength.  Return to clinic on an as-needed basis    No orders of the defined types were placed in this encounter.  The patient has a good understanding of the overall plan. she agrees with it. she will call with any problems that may develop before the next visit here. Total time spent: 30 mins including face to face time and time spent for planning, charting and co-ordination of care   Viinay K Ambreen Tufte, MD 08/01/23

## 2023-08-01 NOTE — Assessment & Plan Note (Signed)
07/03/2018: T2N0 stage Ib grade 2 IDC ER positive, PR weakly positive, HER2 negative, Ki-67 5% 07/12/2018: Neoadjuvant anastrozole 08/31/2018: Right lumpectomy: T2N0 stage Ib grade 2 IDC, negative margins, 0/3 lymph nodes negative, MammaPrint low risk, genetics: Negative   Breast cancer surveillance: 1.  Breast exam 07/28/2022: Benign 2. mammogram 04/21/2022: Benign breast density category C Bone density: October 2021: Osteoporosis T score -3.1: I sent a prescription for Fosamax today and encouraged her to take calcium and vitamin D.   Iron deficiency anemia 02/09/2021: Hemoglobin 12, MCV 90.6, 12% iron saturation, ferritin 259 07/27/2021: Hemoglobin 12.8, MCV 93.9, TIBC 395, iron saturation 24%, CMP normal 02/10/2022: Hemoglobin 13.1, MCV 90 07/28/2022: Hemoglobin 12.7, MCV 92.2  IV iron: June 2022 Since she has not had any further issues with iron deficiency we can stop checking her labs for next year onwards.  Recheck labs in 1 year and follow-up .  And after that she could be seen on an as-needed basis.

## 2023-08-02 ENCOUNTER — Ambulatory Visit
Admission: RE | Admit: 2023-08-02 | Discharge: 2023-08-02 | Discharge status: Home / Self Care | Source: Ambulatory Visit | Attending: Hematology and Oncology

## 2023-08-02 DIAGNOSIS — C50211 Malignant neoplasm of upper-inner quadrant of right female breast: Secondary | ICD-10-CM

## 2023-08-02 DIAGNOSIS — Z1231 Encounter for screening mammogram for malignant neoplasm of breast: Secondary | ICD-10-CM | POA: Diagnosis not present

## 2023-09-17 ENCOUNTER — Other Ambulatory Visit: Payer: Self-pay | Admitting: Medical-Surgical

## 2023-09-17 DIAGNOSIS — R519 Headache, unspecified: Secondary | ICD-10-CM

## 2023-09-18 ENCOUNTER — Other Ambulatory Visit: Payer: Self-pay | Admitting: Medical-Surgical

## 2023-09-18 DIAGNOSIS — Z636 Dependent relative needing care at home: Secondary | ICD-10-CM

## 2023-09-18 DIAGNOSIS — E785 Hyperlipidemia, unspecified: Secondary | ICD-10-CM

## 2023-09-20 ENCOUNTER — Telehealth: Payer: Self-pay

## 2023-09-20 NOTE — Telephone Encounter (Signed)
 Charolette with [facility] calling [phone] Wanting to be sure that  provider is aware of possible reaction with two medications Topamax  and Metformin   taken together can increase lactic acidosis risk - that is worse than metformin  alone.  She states this will need to be monitored and needing to receive  a call back that provider is aware and to proceed with filling the medicatin.

## 2023-09-20 NOTE — Telephone Encounter (Signed)
 Pharmacist informed . She wanted to make note that this could be a delayed reaction even after patient has been on the medications for along period of time. She will get the prescriptions ready for the patient to pick up.

## 2023-10-06 ENCOUNTER — Other Ambulatory Visit: Payer: Self-pay | Admitting: Medical-Surgical

## 2023-10-06 DIAGNOSIS — E1165 Type 2 diabetes mellitus with hyperglycemia: Secondary | ICD-10-CM

## 2023-10-06 DIAGNOSIS — R519 Headache, unspecified: Secondary | ICD-10-CM

## 2023-10-21 ENCOUNTER — Other Ambulatory Visit: Payer: Self-pay | Admitting: Medical-Surgical

## 2023-10-21 DIAGNOSIS — R519 Headache, unspecified: Secondary | ICD-10-CM

## 2023-10-22 ENCOUNTER — Other Ambulatory Visit: Payer: Self-pay | Admitting: Medical-Surgical

## 2023-10-22 DIAGNOSIS — R519 Headache, unspecified: Secondary | ICD-10-CM

## 2023-10-26 ENCOUNTER — Other Ambulatory Visit: Payer: Self-pay | Admitting: Medical-Surgical

## 2023-10-26 DIAGNOSIS — R519 Headache, unspecified: Secondary | ICD-10-CM

## 2023-10-28 ENCOUNTER — Other Ambulatory Visit: Payer: Self-pay | Admitting: Medical-Surgical

## 2023-10-28 DIAGNOSIS — E1165 Type 2 diabetes mellitus with hyperglycemia: Secondary | ICD-10-CM

## 2023-10-28 DIAGNOSIS — Z636 Dependent relative needing care at home: Secondary | ICD-10-CM

## 2023-10-28 DIAGNOSIS — E785 Hyperlipidemia, unspecified: Secondary | ICD-10-CM

## 2023-11-01 ENCOUNTER — Other Ambulatory Visit: Payer: Self-pay | Admitting: Medical-Surgical

## 2023-11-01 DIAGNOSIS — E1165 Type 2 diabetes mellitus with hyperglycemia: Secondary | ICD-10-CM

## 2023-11-01 NOTE — Telephone Encounter (Signed)
 Copied from CRM [id number]. Topic: Clinical - Medication Refill >> Nov 01, 2023  4:55 PM Vivian Z wrote: Medication: metFORMIN  (GLUCOPHAGE ) 500 MG tablet Patient needs bridge prescription until her appointment on 11/16/23.   Has the patient contacted their pharmacy? Yes (Agent: If no, request that the patient contact the pharmacy for the refill. If patient does not wish to contact the pharmacy document the reason why and proceed with request.) (Agent: If yes, when and what did the pharmacy advise?)  This is the patient's preferred pharmacy:  [facility] [address] [place] - [address] [place] [address] Phone: [phone] Fax: [phone]  Is this the correct pharmacy for this prescription? Yes If no, delete pharmacy and type the correct one.   Has the prescription been filled recently? No  Is the patient out of the medication? No  Has the patient been seen for an appointment in the last year OR does the patient have an upcoming appointment? Yes  Can we respond through MyChart? No  Agent: Please be advised that Rx refills may take up to 3 business days. We ask that you follow-up with your pharmacy.

## 2023-11-16 ENCOUNTER — Ambulatory Visit: Admitting: Medical-Surgical

## 2023-11-16 ENCOUNTER — Encounter: Payer: Self-pay | Admitting: Medical-Surgical

## 2023-11-16 VITALS — BP 149/73 | HR 65 | Resp 20 | Ht 62.0 in | Wt 120.0 lb

## 2023-11-16 DIAGNOSIS — G479 Sleep disorder, unspecified: Secondary | ICD-10-CM | POA: Diagnosis not present

## 2023-11-16 DIAGNOSIS — M79641 Pain in right hand: Secondary | ICD-10-CM | POA: Diagnosis not present

## 2023-11-16 DIAGNOSIS — E1165 Type 2 diabetes mellitus with hyperglycemia: Secondary | ICD-10-CM

## 2023-11-16 DIAGNOSIS — Z636 Dependent relative needing care at home: Secondary | ICD-10-CM

## 2023-11-16 DIAGNOSIS — Z7984 Long term (current) use of oral hypoglycemic drugs: Secondary | ICD-10-CM

## 2023-11-16 DIAGNOSIS — E785 Hyperlipidemia, unspecified: Secondary | ICD-10-CM | POA: Diagnosis not present

## 2023-11-16 DIAGNOSIS — E119 Type 2 diabetes mellitus without complications: Secondary | ICD-10-CM | POA: Diagnosis not present

## 2023-11-16 DIAGNOSIS — M810 Age-related osteoporosis without current pathological fracture: Secondary | ICD-10-CM | POA: Diagnosis not present

## 2023-11-16 DIAGNOSIS — R519 Headache, unspecified: Secondary | ICD-10-CM

## 2023-11-16 DIAGNOSIS — I10 Essential (primary) hypertension: Secondary | ICD-10-CM

## 2023-11-16 LAB — POCT UA - MICROALBUMIN
Albumin/Creatinine Ratio, Urine, POC: <30
Creatinine, POC: 200 mg/dL
Microalbumin Ur, POC: 30 mg/L

## 2023-11-16 LAB — POCT GLYCOSYLATED HEMOGLOBIN (HGB A1C)
HbA1c, POC (controlled diabetic range): 6 % (ref 0.0–7.0)
Hemoglobin A1C: 6 % — AB (ref 4.0–5.6)

## 2023-11-16 MED ORDER — TOPIRAMATE 25 MG PO TABS
ORAL_TABLET | ORAL | 3 refills | Status: AC
Start: 2023-11-16 — End: ?

## 2023-11-16 MED ORDER — SERTRALINE HCL 100 MG PO TABS
ORAL_TABLET | ORAL | 3 refills | Status: AC
Start: 2023-11-16 — End: ?

## 2023-11-16 MED ORDER — METFORMIN HCL 500 MG PO TABS
ORAL_TABLET | ORAL | 3 refills | Status: AC
Start: 2023-11-16 — End: ?

## 2023-11-16 MED ORDER — SIMVASTATIN 20 MG PO TABS: ORAL_TABLET | ORAL | 3 refills | Status: DC

## 2023-11-16 MED ORDER — ALENDRONATE SODIUM 70 MG PO TABS: ORAL_TABLET | ORAL | 0 refills | Status: DC

## 2023-11-16 MED ORDER — LOSARTAN POTASSIUM 50 MG PO TABS: 50.0000 mg | ORAL_TABLET | Freq: Every day | ORAL | 3 refills | Status: AC

## 2023-11-16 NOTE — Progress Notes (Signed)
 Established patient visit   History of Present Illness   Discussed the use of AI scribe software for clinical note transcription with the patient, who gave verbal consent to proceed.  History of Present Illness   Emily Hendricks is a 68 year old female with diabetes and hypertension who presents for diabetes management and follow-up.  Glycemic control - Diabetes mellitus with ongoing management and follow-up - Adheres to Metformin  500mg  daily.  Sleep disturbance and fatigue - Difficulty initiating and maintaining sleep for the past one to two weeks - Frequent nocturnal awakenings to urinate, unable to return to sleep - Morning fatigue present - No racing thoughts or anxiety - No depression, anxiety, or tension  Right hand pain - Pain in right palm during activities such as cooking - Pain worsens with use, absent at rest - No locking or catching of the fingers   Hypertension - Taking Losartan  50mg  daily, no SE - Not regularly monitoring BP at home  Osteoporosis management - Takes Fosamax  weekly for osteoporosis      Physical Exam   Physical Exam Vitals reviewed.  Constitutional:      General: She is not in acute distress.    Appearance: Normal appearance. She is not ill-appearing.  HENT:     Head: Normocephalic and atraumatic.  Cardiovascular:     Rate and Rhythm: Normal rate and regular rhythm.     Pulses: Normal pulses.     Heart sounds: Normal heart sounds. No murmur heard.    No friction rub. No gallop.  Pulmonary:     Effort: Pulmonary effort is normal. No respiratory distress.     Breath sounds: Normal breath sounds. No wheezing.  Musculoskeletal:     Right hand: Tenderness (fourth palmar flexor tendon) present. Decreased range of motion (fourth palmar flexor tendon).  Skin:    General: Skin is warm and dry.  Neurological:     Mental Status: She is alert and oriented to person, place, and time.  Psychiatric:        Mood and Affect: Mood  normal.        Behavior: Behavior normal.        Thought Content: Thought content normal.        Judgment: Judgment normal.    Assessment & Plan   Assessment and Plan    Essential hypertension Blood pressure elevated at 152/76 mmHg. Medication taken without food. - BP remains elevated on recheck. - Increase Losartan  to 75mg  daily. - Return in 2 weeks for NV for BP check.  Type 2 diabetes mellitus Prior A1c 6.0% when last checked. Recheck today 6.0%.  - Continue Metformin  500mg  daily  Right hand flexor tendon contracture  Pain in right hand likely due to flexor tendon issues. Less likely trigger finger. Consider Dupuytren's contracture. - Refer to physical therapy for right hand. - Consider referral to orthopedic provider if physical therapy is ineffective.  Osteoporosis Continuing Fosamax . Bone density scan last resulted in 2021. - Order bone density scan.  Insomnia Difficulty falling back asleep after waking for 1-2 weeks. - Recommend trial of melatonin for sleep. - Avoid medications containing Benadryl due to high risk for falls/grogginess.  General Health Maintenance Discussed multivitamin with vitamin D  and iron  for fatigue and energy. - Recommend daily multivitamin taken away from other medications.     Follow up   Return in about 6 months (around 05/18/2024) for chronic disease follow up. __________________________________ Zada FREDRIK Palin, DNP, APRN, FNP-BC Primary Care  and Sports Medicine [facility]

## 2023-11-17 ENCOUNTER — Ambulatory Visit: Payer: Self-pay | Admitting: Medical-Surgical

## 2023-11-17 DIAGNOSIS — E785 Hyperlipidemia, unspecified: Secondary | ICD-10-CM

## 2023-11-17 LAB — CMP14+EGFR
ALT: 17 IU/L (ref 0–32)
AST: 27 IU/L (ref 0–40)
Albumin: 4.4 g/dL (ref 3.9–4.9)
Alkaline Phosphatase: 92 IU/L (ref 44–121)
BUN/Creatinine Ratio: 16 (ref 12–28)
BUN: 14 mg/dL (ref 8–27)
Bilirubin Total: 0.4 mg/dL (ref 0.0–1.2)
CO2: 21 mmol/L (ref 20–29)
Calcium: 9.3 mg/dL (ref 8.7–10.3)
Chloride: 102 mmol/L (ref 96–106)
Creatinine, Ser: 0.86 mg/dL (ref 0.57–1.00)
Globulin, Total: 2.4 g/dL (ref 1.5–4.5)
Glucose: 94 mg/dL (ref 70–99)
Potassium: 4.4 mmol/L (ref 3.5–5.2)
Sodium: 140 mmol/L (ref 134–144)
Total Protein: 6.8 g/dL (ref 6.0–8.5)
eGFR: 73 mL/min/1.73 (ref >59)

## 2023-11-17 LAB — LIPID PANEL
Chol/HDL Ratio: 2.7 ratio (ref 0.0–4.4)
Cholesterol, Total: 156 mg/dL (ref 100–199)
HDL: 57 mg/dL (ref >39)
LDL Chol Calc (NIH): 81 mg/dL (ref 0–99)
Triglycerides: 100 mg/dL (ref 0–149)
VLDL Cholesterol Cal: 18 mg/dL (ref 5–40)

## 2023-11-17 LAB — CBC
Hematocrit: 36.8 % (ref 34.0–46.6)
Hemoglobin: 11.6 g/dL (ref 11.1–15.9)
MCH: 29 pg (ref 26.6–33.0)
MCHC: 31.5 g/dL (ref 31.5–35.7)
MCV: 92 fL (ref 79–97)
Platelets: 194 x10E3/uL (ref 150–450)
RBC: 4 x10E6/uL (ref 3.77–5.28)
RDW: 13.4 % (ref 11.7–15.4)
WBC: 3 x10E3/uL — ABNORMAL LOW (ref 3.4–10.8)

## 2023-11-22 ENCOUNTER — Encounter: Payer: Self-pay | Admitting: Sports Medicine

## 2023-11-24 ENCOUNTER — Ambulatory Visit: Payer: Self-pay

## 2023-11-24 NOTE — Telephone Encounter (Signed)
 FYI Only or Action Required?: FYI only for provider. - Scheduled earliest available appt with preferred location, they may go to UC today and will cancel appt if do so  Patient was last seen in primary care on 11/16/2023 by Emily Mini, NP.  Called Nurse Triage reporting Groin Pain, Urinary Frequency, and limping.  Symptoms began several days ago.  Interventions attempted: OTC medications: tylenol  and Rest, hydration, or home remedies.  Symptoms are: gradually worsening.  Triage Disposition: See HCP Within 4 Hours (Or PCP Triage)  Patient/caregiver understands and will follow disposition?: Unsure     Copied from CRM [id number]. Topic: Clinical - Red Word Triage >> Nov 24, 2023  7:59 AM Farrel B wrote: [facility] that prompted transfer to Nurse Triage: Patient is experiencing groin pain on right hand side, standing and sitting. Reason for Disposition  [1] MILD-MODERATE pain AND [2] constant AND [3] present > 2 hours  Answer Assessment - Initial Assessment Questions rSweta Niu  1. LOCATION: Where does it hurt?      Right side groin 2. RADIATION: Does the pain shoot anywhere else? (e.g., lower back, groin, thighs)     On right groin area and every time walking she's limping 3. ONSET: When did the pain begin? (e.g., minutes, hours or days ago)      4 days ago 4. SUDDEN: Gradual or sudden onset?     Pain gradually increased, wanted to wait couple days, got worse 5. PATTERN Does the pain come and go, or is it constant?     constant 6. SEVERITY: How bad is the pain?  (e.g., Scale 1-10; mild, moderate, or severe)     severe 7. RECURRENT SYMPTOM: Have you ever had this type of pelvic pain before? If Yes, ask: When was the last time? and What happened that time?      no 9. RELIEVING/AGGRAVATING FACTORS: What makes it better or worse? (e.g., activity/rest, sexual intercourse, voiding, passing stool)    if moving/walking around little bit then not as bad but every time  sits down pain gets worse, standing after sitting hurts more, limps on right side Tylenol  helped a little bit but scared of taking pain meds 10. OTHER SYMPTOMS: Has there been any other symptoms? (e.g., fever, constipation, diarrhea, urine problems, vaginal bleeding, vaginal discharge, or vomiting?       Unknown if redness or swelling no fever or chills Been peeing a lot recently, don't know if did urine test No blood in urine or pain with urination No vomiting, diarrhea, back pain Has tried to do her normal stuff but not comfortable    Advised pt be examined in next 4 hours, offered other appt within region today with no PCP availability, offered [facility] and [facility], scheduled earliest available with preferred location, pt son stated they may go to UC in meantime and will call to cancel if do so, pt son stated understanding to call back if new or worsening symptoms.  Protocols used: Pelvic Pain - Female-A-AH

## 2023-11-24 NOTE — Telephone Encounter (Signed)
 Patient scheduled 09/052025 with whitney crain

## 2023-11-24 NOTE — Telephone Encounter (Signed)
 Patient's daughter called back to check if there were any earlier appts. None available at PCP or coordinating offices. Added to wait list.

## 2023-11-25 ENCOUNTER — Ambulatory Visit: Payer: Self-pay | Admitting: Urgent Care

## 2023-11-25 ENCOUNTER — Encounter: Payer: Self-pay | Admitting: Urgent Care

## 2023-11-25 ENCOUNTER — Ambulatory Visit (INDEPENDENT_AMBULATORY_CARE_PROVIDER_SITE_OTHER)

## 2023-11-25 ENCOUNTER — Ambulatory Visit (INDEPENDENT_AMBULATORY_CARE_PROVIDER_SITE_OTHER): Admitting: Urgent Care

## 2023-11-25 VITALS — BP 151/72 | HR 65 | Wt 121.0 lb

## 2023-11-25 DIAGNOSIS — R935 Abnormal findings on diagnostic imaging of other abdominal regions, including retroperitoneum: Secondary | ICD-10-CM | POA: Diagnosis not present

## 2023-11-25 DIAGNOSIS — R262 Difficulty in walking, not elsewhere classified: Secondary | ICD-10-CM | POA: Diagnosis not present

## 2023-11-25 DIAGNOSIS — S329XXA Fracture of unspecified parts of lumbosacral spine and pelvis, initial encounter for closed fracture: Secondary | ICD-10-CM

## 2023-11-25 DIAGNOSIS — M25551 Pain in right hip: Secondary | ICD-10-CM

## 2023-11-25 DIAGNOSIS — R350 Frequency of micturition: Secondary | ICD-10-CM

## 2023-11-25 DIAGNOSIS — R102 Pelvic and perineal pain: Secondary | ICD-10-CM

## 2023-11-25 LAB — POCT URINALYSIS DIP (CLINITEK)
Bilirubin, UA: NEGATIVE
Blood, UA: NEGATIVE
Glucose, UA: NEGATIVE mg/dL
Ketones, POC UA: NEGATIVE mg/dL
Nitrite, UA: NEGATIVE
POC PROTEIN,UA: NEGATIVE
Spec Grav, UA: 1.015 (ref 1.010–1.025)
Urobilinogen, UA: 0.2 U/dL
pH, UA: 6.5 (ref 5.0–8.0)

## 2023-11-25 MED ORDER — HYDROCODONE-ACETAMINOPHEN 5-325 MG PO TABS: 1.0000 | ORAL_TABLET | Freq: Three times a day (TID) | ORAL | 0 refills | Status: DC | PRN

## 2023-11-25 MED ORDER — KETOROLAC TROMETHAMINE 60 MG/2ML IM SOLN
60.0000 mg | Freq: Once | INTRAMUSCULAR | Status: AC
  Administered 2023-11-25: 60 mg via INTRAMUSCULAR

## 2023-11-25 NOTE — Patient Instructions (Signed)
 Please go to [address] to have a STAT hip/ pelvis xray.  You were given an injection of toradol  today to help with pain. Please do not take any additional NSAIDS today (motrin , aleve, ibuprofen , advil , naproxen).  We will call with the results of the xray to determine next steps.

## 2023-11-25 NOTE — Telephone Encounter (Signed)
 Patient advised of results and recommendations. She agreed to have the CT.

## 2023-11-25 NOTE — Progress Notes (Signed)
 Established Patient Office Visit  Subjective:  Patient ID: Emily Hendricks, female    DOB: 08-24-1955  Age: 68 y.o. MRN: [id number]  Chief Complaint  Patient presents with   Groin Pain    X5 days    Groin Pain    Discussed the use of AI scribe software for clinical note transcription with the patient, who gave verbal consent to proceed.  History of Present Illness   Emily Hendricks is a 68 year old female who presents with acute groin pain.  She has experienced a sudden onset of severe groin pain for the past five days, localized to the groin without radiation to the abdomen or hip. The pain starts in the superior medial thigh and extends downwards minimally, but remains confined to that defined region. Movement, especially transitioning from sitting to standing, exacerbates the pain, while sitting or lying still provides relief. Tylenol  provides some relief, but the pain is significantly worse without it.  There is no history of trauma or fall preceding the onset of pain. No burning sensation during urination, but there is slight increased frequency of urination. She has a history of arthritis, which typically causes her to limp, but this pain is distinct and more severe, affecting her ability to rise from a seated position or ambulate normally. No change in skin color, no bruising, no edema of leg.  She does not feel any lumps or bumps. No recent changes in her medications, and no history of urinary tract infection as her urine was reported to be normal.       Patient Active Problem List   Diagnosis Date Noted   Type 2 diabetes mellitus without complication, without long-term current use of insulin (HCC) 03/17/2022   Perforation of right tympanic membrane 03/17/2022   Primary osteoarthritis of right knee 03/17/2022   Essential hypertension 09/17/2020   Dyslipidemia 09/17/2020   Pre-diabetes 09/17/2020   Absolute anemia 08/12/2020   Iron  deficiency anemia 08/12/2020    Genetic testing 07/24/2018   Family history of cancer    Malignant neoplasm of upper-inner quadrant of right breast in female, estrogen receptor positive (HCC) 07/06/2018   Past Medical History:  Diagnosis Date   Breast cancer (HCC) 2020   mastectomy   Diabetes mellitus without complication (HCC)    on meds   Family history of cancer    Hyperlipidemia    on meds   Hypertension    on meds   Personal history of radiation therapy    Past Surgical History:  Procedure Laterality Date   BREAST BIOPSY Right 2020   BREAST LUMPECTOMY Right 2020   BREAST LUMPECTOMY WITH RADIOACTIVE SEED AND SENTINEL LYMPH NODE BIOPSY Right 08/31/2018   Procedure: RIGHT BREAST LUMPECTOMY WITH RADIOACTIVE SEED AND SENTINEL LYMPH NODE BIOPSY;  Surgeon: Aron Shoulders, MD;  Location: [facility];  Service: General;  Laterality: Right;   COLONOSCOPY  2018   HD-MAC-supre(fair) with lavage-TA x 6   POLYPECTOMY  2018   TA x 6   TUBAL LIGATION     Social History   Tobacco Use   Smoking status: Never   Smokeless tobacco: Never  Vaping Use   Vaping status: Never Used  Substance Use Topics   Alcohol use: No   Drug use: No      ROS: as noted in HPI  Objective:     BP (!) 151/72   Pulse 65   Wt 121 lb (54.9 kg)   SpO2 97%   BMI 22.13  kg/m  BP Readings from Last 3 Encounters:  11/25/23 (!) 151/72  11/16/23 (!) 149/73  08/01/23 138/82   Wt Readings from Last 3 Encounters:  11/25/23 121 lb (54.9 kg)  11/16/23 120 lb (54.4 kg)  08/01/23 118 lb 4.8 oz (53.7 kg)      Physical Exam Vitals and nursing note reviewed. Exam conducted with a chaperone present.  Constitutional:      General: She is not in acute distress.    Appearance: Normal appearance. She is normal weight. She is not ill-appearing, toxic-appearing or diaphoretic.  HENT:     Head: Normocephalic and atraumatic.  Eyes:     General: No scleral icterus.       Right eye: No discharge.        Left eye: No discharge.   Cardiovascular:     Rate and Rhythm: Normal rate.  Pulmonary:     Effort: Pulmonary effort is normal. No respiratory distress.  Musculoskeletal:     Right hip: Tenderness and bony tenderness present. Decreased range of motion.     Right upper leg: Tenderness present.     Right knee: Normal. No swelling.     Right ankle: Normal.     Right foot: Normal.       Legs:     Comments: Positive faber test Reproducible tenderness to R inguinal region and medial thigh  Skin:    General: Skin is warm and dry.     Coloration: Skin is not jaundiced.     Findings: No bruising, erythema or rash.  Neurological:     General: No focal deficit present.     Mental Status: She is alert and oriented to person, place, and time.     Gait: Gait abnormal (antalgic gait, refusing to put pressure on R leg).      Results for orders placed or performed in visit on 11/25/23  POCT URINALYSIS DIP (CLINITEK)  Result Value Ref Range   Color, UA yellow yellow   Clarity, UA clear clear   Glucose, UA negative negative mg/dL   Bilirubin, UA negative negative   Ketones, POC UA negative negative mg/dL   Spec Grav, UA 8.984 8.989 - 1.025   Blood, UA negative negative   pH, UA 6.5 5.0 - 8.0   POC PROTEIN,UA negative negative, trace   Urobilinogen, UA 0.2 0.2 or 1.0 E.U./dL   Nitrite, UA Negative Negative   Leukocytes, UA Small (1+) (A) Negative    Last CBC Lab Results  Component Value Date   WBC 3.0 (L) 11/16/2023   HGB 11.6 11/16/2023   HCT 36.8 11/16/2023   MCV 92 11/16/2023   MCH 29.0 11/16/2023   RDW 13.4 11/16/2023   PLT 194 11/16/2023   Last metabolic panel Lab Results  Component Value Date   GLUCOSE 94 11/16/2023   NA 140 11/16/2023   K 4.4 11/16/2023   CL 102 11/16/2023   CO2 21 11/16/2023   BUN 14 11/16/2023   CREATININE 0.86 11/16/2023   EGFR 73 11/16/2023   CALCIUM 9.3 11/16/2023   PROT 6.8 11/16/2023   ALBUMIN 4.4 11/16/2023   LABGLOB 2.4 11/16/2023   AGRATIO 2.0 02/10/2022    BILITOT 0.4 11/16/2023   ALKPHOS 92 11/16/2023   AST 27 11/16/2023   ALT 17 11/16/2023   ANIONGAP 5 07/28/2022   Last lipids Lab Results  Component Value Date   CHOL 156 11/16/2023   HDL 57 11/16/2023   LDLCALC 81 11/16/2023   TRIG 100 11/16/2023  CHOLHDL 2.7 11/16/2023   Last hemoglobin A1c Lab Results  Component Value Date   HGBA1C 6.0 (A) 11/16/2023   HGBA1C 6.0 11/16/2023      The ASCVD Risk score (Arnett DK, et al., 2019) failed to calculate for the following reasons:   Unable to determine if patient is Non-Hispanic African American  Assessment & Plan:  Acute hip pain, right -     DG HIP UNILAT W OR W/O PELVIS 2-3 VIEWS RIGHT; Future -     Ketorolac  Tromethamine   Frequency of urination -     POCT URINALYSIS DIP (CLINITEK)  Assessment and Plan    Right groin and hip pain Acute pain localized to the groin, exacerbated by movement, alleviated by rest. Differential includes hip fracture. Hip fracture less likely without trauma but requires imaging to rule out. - Order hip and pelvis x-ray. - Administer intramuscular Toradol  for pain relief. - Arrange wheelchair assistance to x-ray. - Consider MRI if x-ray is normal. - Coordinate follow-up with x-ray results and treatment plan.         No follow-ups on file.   Benton LITTIE Gave, PA

## 2023-11-27 MED ORDER — SIMVASTATIN 40 MG PO TABS
ORAL_TABLET | ORAL | 1 refills | Status: AC
Start: 2023-11-27 — End: ?

## 2023-11-28 ENCOUNTER — Ambulatory Visit (INDEPENDENT_AMBULATORY_CARE_PROVIDER_SITE_OTHER)

## 2023-11-28 ENCOUNTER — Ambulatory Visit: Payer: Self-pay | Admitting: Urgent Care

## 2023-11-28 ENCOUNTER — Telehealth: Payer: Self-pay

## 2023-11-28 DIAGNOSIS — K429 Umbilical hernia without obstruction or gangrene: Secondary | ICD-10-CM | POA: Diagnosis not present

## 2023-11-28 DIAGNOSIS — R935 Abnormal findings on diagnostic imaging of other abdominal regions, including retroperitoneum: Secondary | ICD-10-CM | POA: Diagnosis not present

## 2023-11-28 DIAGNOSIS — M84350A Stress fracture, pelvis, initial encounter for fracture: Secondary | ICD-10-CM

## 2023-11-28 DIAGNOSIS — R102 Pelvic and perineal pain: Secondary | ICD-10-CM | POA: Diagnosis not present

## 2023-11-28 DIAGNOSIS — S3289XA Fracture of other parts of pelvis, initial encounter for closed fracture: Secondary | ICD-10-CM

## 2023-11-28 NOTE — Telephone Encounter (Signed)
 Patient daughter Emily Hendricks informed that pain medication was sent to [facility] [address] [facility] on Friday [id number]/7974.

## 2023-11-28 NOTE — Telephone Encounter (Signed)
 Copied from CRM #[id number]. Topic: Clinical - Medication Question >> Nov 28, 2023  8:35 AM Diannia H wrote: Reason for CRM: Patient son called and stated the patient needed something stronger for her pain why they wait on the CT scan.   [facility] - [place], [place] - [address] [place] [address] Phone: [phone] Fax: [phone] Hours: Not open 24 hours

## 2023-11-29 DIAGNOSIS — M810 Age-related osteoporosis without current pathological fracture: Secondary | ICD-10-CM | POA: Diagnosis not present

## 2023-11-29 DIAGNOSIS — S32591A Other specified fracture of right pubis, initial encounter for closed fracture: Secondary | ICD-10-CM | POA: Diagnosis not present

## 2023-12-20 ENCOUNTER — Ambulatory Visit (HOSPITAL_BASED_OUTPATIENT_CLINIC_OR_DEPARTMENT_OTHER)

## 2023-12-20 ENCOUNTER — Ambulatory Visit (INDEPENDENT_AMBULATORY_CARE_PROVIDER_SITE_OTHER): Admitting: Student

## 2023-12-20 ENCOUNTER — Telehealth: Payer: Self-pay

## 2023-12-20 ENCOUNTER — Ambulatory Visit: Admitting: Urgent Care

## 2023-12-20 DIAGNOSIS — M25561 Pain in right knee: Secondary | ICD-10-CM

## 2023-12-20 DIAGNOSIS — M1711 Unilateral primary osteoarthritis, right knee: Secondary | ICD-10-CM

## 2023-12-20 MED ORDER — LIDOCAINE HCL 1 % IJ SOLN
4.0000 mL | INTRAMUSCULAR | Status: AC | PRN
  Administered 2023-12-20: 4 mL

## 2023-12-20 MED ORDER — TRIAMCINOLONE ACETONIDE 40 MG/ML IJ SUSP
2.0000 mL | INTRAMUSCULAR | Status: AC | PRN
  Administered 2023-12-20: 2 mL via INTRA_ARTICULAR

## 2023-12-20 NOTE — Progress Notes (Signed)
 Chief Complaint: Right knee pain    Discussed the use of AI scribe software for clinical note transcription with the patient, who gave verbal consent to proceed.  History of Present Illness Emily Hendricks is a 68 year old female with a history of arthritis who presents with right knee pain. She is accompanied by her daughter.  She has experienced right knee pain for about a week, with worsening symptoms over the past few days. The pain is localized to the anterior knee and was severe enough yesterday to prevent ambulation. There is no recent knee injury.  On 07/20/2022 she underwent fluid aspiration from the same knee, with 20 mL of fluid removed, followed by an intra-articular injection that provided prolonged relief. She continues to use ice packs and a compression sleeve, which previously offered significant relief. Weather changes seem to exacerbate her knee pain. Currently, there is no significant swelling in the right knee.   Surgical History:   None  PMH/PSH/Family History/Social History/Meds/Allergies:    Past Medical History:  Diagnosis Date   Breast cancer (HCC) 2020   mastectomy   Diabetes mellitus without complication (HCC)    on meds   Family history of cancer    Hyperlipidemia    on meds   Hypertension    on meds   Personal history of radiation therapy    Past Surgical History:  Procedure Laterality Date   BREAST BIOPSY Right 2020   BREAST LUMPECTOMY Right 2020   BREAST LUMPECTOMY WITH RADIOACTIVE SEED AND SENTINEL LYMPH NODE BIOPSY Right 08/31/2018   Procedure: RIGHT BREAST LUMPECTOMY WITH RADIOACTIVE SEED AND SENTINEL LYMPH NODE BIOPSY;  Surgeon: Aron Shoulders, MD;  Location: [facility];  Service: General;  Laterality: Right;   COLONOSCOPY  2018   HD-MAC-supre(fair) with lavage-TA x 6   POLYPECTOMY  2018   TA x 6   TUBAL LIGATION     Social History   Socioeconomic History   Marital status: Married     Spouse name: Not on file   Number of children: 2   Years of education: Not on file   Highest education level: 12th grade  Occupational History   Not on file  Tobacco Use   Smoking status: Never   Smokeless tobacco: Never  Vaping Use   Vaping status: Never Used  Substance and Sexual Activity   Alcohol use: No   Drug use: No   Sexual activity: Not Currently  Other Topics Concern   Not on file  Social History Narrative   Not on file   Social Drivers of Health   Financial Resource Strain: Medium Risk (11/15/2023)   Overall Financial Resource Strain (CARDIA)    Difficulty of Paying Living Expenses: Somewhat hard  Food Insecurity: Food Insecurity Present (11/15/2023)   Hunger Vital Sign    Worried About Running Out of Food in the Last Year: Never true    Ran Out of Food in the Last Year: Sometimes true  Transportation Needs: Patient Declined (11/15/2023)   PRAPARE - Transportation    Lack of Transportation (Medical): Patient declined    Lack of Transportation (Non-Medical): Patient declined  Physical Activity: Insufficiently Active (11/15/2023)   Exercise Vital Sign    Days of Exercise per Week: 2 days    Minutes of Exercise per Session: 10 min  Stress: Stress Concern Present (11/15/2023)   [employer] of Occupational Health - Occupational Stress Questionnaire    Feeling of Stress: To some extent  Social Connections: Moderately Isolated (11/15/2023)   Social Connection and Isolation Panel    Frequency of Communication with Friends and Family: Three times a week    Frequency of Social Gatherings with Friends and Family: Never    Attends Religious Services: Never    Database administrator or Organizations: No    Attends Engineer, structural: Not on file    Marital Status: Married   Family History  Problem Relation Age of Onset   Cancer Mother 43       unknown cancer, in her lower abdomen   Breast cancer Sister    Cancer Sister 85       unknown cancer in her  lower abdomen   Breast cancer Daughter    Ovarian cancer Maternal Grandmother    Heart disease Brother    Cancer Niece        unknown form of cancer   Colon polyps Neg Hx    Colon cancer Neg Hx    Esophageal cancer Neg Hx    Rectal cancer Neg Hx    Stomach cancer Neg Hx    Allergies  Allergen Reactions   Lisinopril  Other (See Comments) and Cough    headaches   Current Outpatient Medications  Medication Sig Dispense Refill   alendronate  (FOSAMAX ) 70 MG tablet TAKE 1 TABLET BY MOUTH ONCE A WEEK TAKE  WITH  FULL  GLASS  OF  WATER  AND  ON  AN  EMPTY  STOMACH 12 tablet 0   ferrous sulfate  325 (65 FE) MG tablet Take 1 tablet (325 mg total) by mouth daily with breakfast. (Patient not taking: Reported on 11/25/2023) 90 tablet 4   HYDROcodone -acetaminophen  (NORCO/VICODIN) 5-325 MG tablet Take 1 tablet by mouth every 8 (eight) hours as needed for moderate pain (pain score 4-6) or severe pain (pain score 7-10). 10 tablet 0   losartan  (COZAAR ) 50 MG tablet Take 1 tablet (50 mg total) by mouth daily. 90 tablet 3   meloxicam  (MOBIC ) 15 MG tablet TAKE 1 TABLET (15 MG TOTAL) BY MOUTH DAILY. NEEDS APPOINTMENT FOR FURTHER REFILLS (Patient not taking: Reported on 11/25/2023) 7 tablet 0   metFORMIN  (GLUCOPHAGE ) 500 MG tablet TAKE 1 TABLET BY MOUTH TWICE DAILY WITH A MEAL. 90 tablet 3   Multiple Vitamin (MULTIVITAMIN PO) Take 1 tablet by mouth daily at 6 (six) AM.     Na Sulfate-K Sulfate-Mg Sulf 17.5-3.13-1.6 GM/177ML SOLN Take by mouth.     sertraline  (ZOLOFT ) 100 MG tablet TAKE 1 TABLET BY MOUTH ONCE DAILY 90 tablet 3   simvastatin  (ZOCOR ) 40 MG tablet TAKE 1 TABLET BY MOUTH ONCE DAILY 90 tablet 1   topiramate  (TOPAMAX ) 25 MG tablet TAKE 1 TABLET BY MOUTH ONCE DAILY 90 tablet 3   No current facility-administered medications for this visit.   No results found.  Review of Systems:   A ROS was performed including pertinent positives and negatives as documented in the HPI.  Physical Exam :    Constitutional: NAD and appears stated age Neurological: Alert and oriented Psych: Appropriate affect and cooperative There were no vitals taken for this visit.   Comprehensive Musculoskeletal Exam:    Exam of the right knee demonstrates tenderness over bilateral joint lines.  Active range of motion is from 0 to 100 degrees.  No significant effusion, erythema, or warmth.  Stable collaterals with varus and valgus stress.  Imaging:   Xray (right knee 4 views): Tricompartmental osteoarthritis which appears moderate in the medial and patellofemoral compartments with joint space narrowing and osteophyte formation.   I personally reviewed and interpreted the radiographs.      Assessment & Plan Right knee osteoarthritis   Symptoms have recently worsened with pain and mild swelling. X-rays reveal joint space narrowing and osteophytes. The exacerbation is likely attributed to compensatory changes due to recent pelvic ramus fracture on the ipsilateral side.  Recommend to administer a steroid injection to the right knee for inflammation and pain relief which patient is agreeable to.  Injection was performed without complication.  Last injection was over 1 year ago so hopeful this will give her long-lasting relief.  Advise continued use of a compression sleeve to manage swelling.      Procedure Note  Patient: Emily Hendricks             Date of Birth: [birth year]           MRN: [id number]             Visit Date: 12/20/2023  Procedures: Visit Diagnoses:  1. Unilateral primary osteoarthritis, right knee     Large Joint Inj: R knee on 12/20/2023 11:49 AM Indications: pain Details: 25 G 1.5 in needle, anterolateral approach Medications: 4 mL lidocaine  1 %; 2 mL triamcinolone  acetonide 40 MG/ML Outcome: tolerated well, no immediate complications Procedure, treatment alternatives, risks and benefits explained, specific risks discussed. Consent was given by the patient. Immediately prior to  procedure a time out was called to verify the correct patient, procedure, equipment, support staff and site/side marked as required. Patient was prepped and draped in the usual sterile fashion.       I personally saw and evaluated the patient, and participated in the management and treatment plan.  Leonce Reveal, PA-C Orthopedics

## 2023-12-20 NOTE — Telephone Encounter (Signed)
 Copied from CRM [id number]. Topic: General - Other >> Dec 20, 2023  8:08 AM Cleave MATSU wrote: Reason for CRM: I scheduled pt appt for today at 1:40 but she also would like to know if joy could squeeze her in she stated last time joy told her to send her a message so she can see if she can squeeze her in please call pt back to advise before her appt today at 1:40  [phone]

## 2023-12-21 NOTE — Telephone Encounter (Signed)
 Patient chart shows patient was seen at [facility] on 12/20/2023

## 2023-12-26 ENCOUNTER — Ambulatory Visit: Payer: Self-pay

## 2023-12-26 NOTE — Telephone Encounter (Signed)
 Spoke with patients' daughter Baila Rouse) Informed her about providers message and asked did they schedule with [facility].  Patients' daughter states that they did see the specialist and was given a Rx for a walking cane and was instructed to use OTC medication for the pain. Patients' daughter stated they would just use the OTC medication and wait it out to see if she feels better

## 2023-12-26 NOTE — Telephone Encounter (Signed)
 So, actually she was seen by ortho at [facility], not [facility]. It appears all he did was address her knee pain. He did not even assess her pelvic fracture. Please have her call the specialist I referred her to to address the fracture. A walking cane was given for her osteoarthritis of her knee. [phone] this is the number she needs to call. thanks

## 2023-12-26 NOTE — Telephone Encounter (Signed)
 Summary: pelvic concerns / ortho concerns   The patient has been previously seen for a pelvic fracture and their daughter shares that they are concerned that the patient is continuing to experience orthopedic concerns. Please contact further if/when possible.  The patient's daughter has stressed th urgency for response because the patient went roughly 1 month with discomfort before

## 2023-12-26 NOTE — Telephone Encounter (Signed)
 Patient's daughter wants the advice of the provider who saw patient for this complaint--Whitney Crain if possible. Patient's daughter states that the patient is able to move around but she is just having pain that they are concerned about with certain movements. She didn't want to schedule an appt with the pcp office for tomorrow with a different provider as the patient has seen 3 different providers for this concern It was said that she may have a fracture and daughter states she doesn't know what the next step from here is at this point Daughter wants to know advice on whether to come to this office or if they need to try to follow back up with ortho Daughter would like be called about this  FYI Only or Action Required?: Action required by provider: clinical question for provider and update on patient condition.  Patient was last seen in primary care on 11/25/2023 by Lowella Benton CROME, PA.  Called Nurse Triage reporting Hip Pain.  Symptoms began several days ago.  Interventions attempted: OTC medications: ibuprofen  and Rest, hydration, or home remedies.  Symptoms are: gradually worsening.  Triage Disposition: See Physician Within 24 Hours  Patient/caregiver understands and will follow disposition?: No, wishes to speak with PCP  Reason for Disposition  Urinating more frequently than usual (i.e., frequency) OR new-onset of the feeling of an urgent need to urinate (i.e., urgency)  [1] MODERATE pain (e.g., interferes with normal activities, limping) AND [2] present > 3 days  Answer Assessment - Initial Assessment Questions Urinary symptoms--just frequency that started last night with no other symptoms  Patient saw Benton Lowella PA and a referral to ortho was made & they went to that visit Daughter states that the pain is worse Pt was seen 12/20/2023 for right knee  Pelvic pain x 3 days Last night patient woke up with urinary frequency x 4-5 times with urgency  7 or 8 last night Today  4-5 after ibuprofen  over the counter 3 200mg  tablets every 6-8 hours as needed No knee pain but pelvic pain still present  No other symptoms When sitting down there is pain or when trying to stand up there is pain Patient's daughter wants the advice of the provider who saw patient for this complaint--Whitney Crain if possible. Patient's daughter states that the patient is able to move around but she is just having pain that they are concerned about with certain movements. Daughter wants to know advice on whether to come to this office or if they need to try to follow back up with ortho  Answer Assessment - Initial Assessment Questions Patient still having right hip pain from previous visits for same Patient saw Benton Lowella PA and a referral to ortho was made & they went to that visit Daughter states that the pain is worse Pt was seen 12/20/2023 for right knee  Pelvic pain x 3 days Last night patient woke up with urinary frequency x 4-5 times with urgency  7 or 8 last night Today 4-5 after ibuprofen  over the counter 3 200mg  tablets every 6-8 hours as needed No knee pain but hip/pelvic pain still present  No other symptoms When sitting down there is pain or when trying to stand up there is pain Patient's daughter wants the advice of the provider who saw patient for this complaint--Whitney Crain if possible. Patient's daughter states that the patient is able to move around but she is just having pain that they are concerned about with certain movements. Daughter wants to know  advice on whether to come to this office or if they need to try to follow back up with ortho Daughter would like be called about this  Protocols used: Hip Pain-A-AH, Urinary Symptoms-A-AH

## 2023-12-26 NOTE — Telephone Encounter (Signed)
 [facility] [address] [place] [address] [phone]  Pt was referred to [facility] back around the 8th of Sept. She should have called them to schedule. Did she do that?

## 2023-12-27 NOTE — Telephone Encounter (Signed)
 1st Attempt  Called patient daughter , No answer or voicemail. Will try again.

## 2023-12-28 NOTE — Telephone Encounter (Signed)
 Spoke with Daughter [name]) and informed her of the Providers comments and she is  positively sure that she went to the correct place ;  and she confirmed.[name], [address] [name], [place] [address]  She did say nothing was done they didn't only told her to use OTC medications.

## 2024-01-04 ENCOUNTER — Telehealth: Payer: Self-pay | Admitting: Medical-Surgical

## 2024-01-04 NOTE — Telephone Encounter (Unsigned)
 Copied from CRM [id number]. Topic: Appointments - Scheduling Inquiry for Clinic >> Jan 04, 2024  1:59 PM Donee H wrote: Reason for CRM: Patient's daughter [name] called requesting to see if patient can be seen sooner then upcoming appointment on Oct. 30, 2025. She states patient may not be able to wait that long to be seen. There is no preferred date or time just before Oct. 30. Please follow up with request   Sweta [phone]

## 2024-01-06 ENCOUNTER — Ambulatory Visit (INDEPENDENT_AMBULATORY_CARE_PROVIDER_SITE_OTHER): Admitting: Medical-Surgical

## 2024-01-06 ENCOUNTER — Ambulatory Visit (INDEPENDENT_AMBULATORY_CARE_PROVIDER_SITE_OTHER)

## 2024-01-06 ENCOUNTER — Encounter: Payer: Self-pay | Admitting: Medical-Surgical

## 2024-01-06 VITALS — BP 123/72 | HR 73 | Resp 20 | Ht 62.0 in | Wt 121.0 lb

## 2024-01-06 DIAGNOSIS — M8000XD Age-related osteoporosis with current pathological fracture, unspecified site, subsequent encounter for fracture with routine healing: Secondary | ICD-10-CM | POA: Diagnosis not present

## 2024-01-06 DIAGNOSIS — R0789 Other chest pain: Secondary | ICD-10-CM

## 2024-01-06 DIAGNOSIS — S329XXD Fracture of unspecified parts of lumbosacral spine and pelvis, subsequent encounter for fracture with routine healing: Secondary | ICD-10-CM

## 2024-01-06 DIAGNOSIS — M810 Age-related osteoporosis without current pathological fracture: Secondary | ICD-10-CM | POA: Insufficient documentation

## 2024-01-06 MED ORDER — HYDROCODONE-ACETAMINOPHEN 5-325 MG PO TABS: 1.0000 | ORAL_TABLET | Freq: Three times a day (TID) | ORAL | 0 refills | Status: DC | PRN

## 2024-01-06 NOTE — Patient Instructions (Signed)
 Pain management:   Norco 1 tablet every 8 hours as needed for severe pain Tylenol  500mg  every 8 hours as needed  Ibuprofen  600mg  every 8 hours as needed

## 2024-01-06 NOTE — Progress Notes (Signed)
 Established patient visit   History of Present Illness   Discussed the use of AI scribe software for clinical note transcription with the patient, who gave verbal consent to proceed.  History of Present Illness   Emily Hendricks is a 68 year old female with osteoporosis who presents for follow up on pelvic fracture.  Pelvic pain and gait disturbance - Onset one and a half months ago without recollection of trauma - CT scan confirmed nondisplaced pelvic fracture - Initial improvement in pain for a few days, but pain has recurred - Significant discomfort with ambulation and rising from seated position - Difficulty walking; inconsistently uses a cane instead of prescribed walker  Analgesic use and adverse effects - Ibuprofen  400 mg every 5-6 hours and 600 mg at night for pain control - Gastric burning sensations associated with ibuprofen  use - Hydrocodone  used sparingly, primarily at night, due to sedation but only had a few tablets prescribed.  Osteoporosis management - Diagnosis of osteoporosis - Currently taking Fosamax  - Last bone density scan was performed in 2021, scheduled to update next month  Arthralgia and arthritis symptoms - History of arthritis with recent recurrence of knee pain - Received steroid injection two and a half weeks ago with some relief  Right shoulder and axillary pain - New pain on right side from shoulder to underarm for the past four days - Pain limits daily activities such as brushing hair - Pain occurs with lifting the arm - No numbness or tingling in the right arm - Uses topical muscle rubs for symptomatic relief     Physical Exam   Physical Exam Vitals reviewed.  Constitutional:      General: She is not in acute distress.    Appearance: Normal appearance. She is not ill-appearing.  HENT:     Head: Normocephalic and atraumatic.  Cardiovascular:     Rate and Rhythm: Normal rate and regular rhythm.     Pulses: Normal pulses.      Heart sounds: Normal heart sounds. No murmur heard.    No friction rub. No gallop.  Pulmonary:     Effort: Pulmonary effort is normal. No respiratory distress.     Breath sounds: Normal breath sounds. No wheezing.  Chest:     Chest wall: Tenderness present.    Musculoskeletal:     Right shoulder: Tenderness present. Decreased range of motion.     Right upper arm: Tenderness present.  Skin:    General: Skin is warm and dry.  Neurological:     Mental Status: She is alert and oriented to person, place, and time.  Psychiatric:        Mood and Affect: Mood normal.        Behavior: Behavior normal.        Thought Content: Thought content normal.        Judgment: Judgment normal.    Assessment & Plan   Assessment and Plan    Closed nondisplaced pelvic fracture with routine healing Pelvic fracture confirmed by CT, likely due to osteoporosis. Persistent pain, worsened by movement. - Referral to home health physical therapy. - Prescribed ibuprofen  600 mg every 6-8 hours as needed. - Prescribed Tylenol  500 mg every 8 hours as needed. - Prescribed Norco 1 tablet every 8 hours as needed for severe pain. - Encouraged use of a walker for stability and pain relief.  Right knee pain, recurrent Recurrent right knee pain, previously improved with steroid injection, worsened by  altered gait from pelvic pain. - Pain management and physical therapy for pelvic fracture may aid in knee pain relief.  Right upper extremity pain, possible musculoskeletal etiology Right upper extremity pain with right rib tenderness and limited movement. Differential includes costochondritis or rib fracture, possibly due to altered sleeping position or compensatory movement. - Ordered X-ray of right ribs to rule out fracture. - Pain management as noted above. - Upper arm pain diffuse and poorly located, likely muscular.  Follow up   Return in 4 weeks (on 02/03/2024) for Pelvic fracture  follow-up. __________________________________ Zada FREDRIK Palin, DNP, APRN, FNP-BC Primary Care and Sports Medicine [facility] [place]

## 2024-01-11 ENCOUNTER — Ambulatory Visit: Payer: Self-pay | Admitting: Medical-Surgical

## 2024-01-11 DIAGNOSIS — R0789 Other chest pain: Secondary | ICD-10-CM

## 2024-01-11 DIAGNOSIS — S329XXD Fracture of unspecified parts of lumbosacral spine and pelvis, subsequent encounter for fracture with routine healing: Secondary | ICD-10-CM

## 2024-01-11 NOTE — Telephone Encounter (Signed)
 Copied from CRM [id number]. Topic: General - Other >> Jan 10, 2024  4:57 PM Mercer PEDLAR wrote: Reason for CRM:  [facility] is calling to notify PCP that they are not able to fulfill PT order due to staffing.  Callback: [phone]

## 2024-01-19 ENCOUNTER — Ambulatory Visit: Admitting: Medical-Surgical

## 2024-01-23 ENCOUNTER — Encounter: Payer: Self-pay | Admitting: Radiology

## 2024-02-01 ENCOUNTER — Other Ambulatory Visit

## 2024-02-06 ENCOUNTER — Ambulatory Visit: Payer: Self-pay

## 2024-02-06 ENCOUNTER — Encounter: Admitting: Urgent Care

## 2024-02-06 MED ORDER — HYDROCODONE-ACETAMINOPHEN 5-325 MG PO TABS: 1.0000 | ORAL_TABLET | Freq: Three times a day (TID) | ORAL | 0 refills | Status: DC | PRN

## 2024-02-06 NOTE — Telephone Encounter (Signed)
 Patient scheduled today 02/06/2024 at 2:40 with Benton crain, PA

## 2024-02-06 NOTE — Telephone Encounter (Signed)
 FYI Only or Action Required?: FYI only for provider: Transferred call to [facility] who was able to schedule for today.  Patient was last seen in primary care on 01/06/2024 by Emily Mini, NP.  Called Nurse Triage reporting Pelvic Pain.  Symptoms began about a month ago.  Interventions attempted: Other: seen in office.  Symptoms are: unchanged.  Triage Disposition: See HCP Within 4 Hours (Or PCP Triage)  Patient/caregiver understands and will follow disposition?: yes                    Copied from CRM #[id number]. Topic: Clinical - Red Word Triage >> Feb 06, 2024  8:05 AM Rosaria A wrote: Red Word that prompted transfer to Nurse Triage: Patients daughter [name] states patient is having bad pelvic pain that is getting worse. Reason for Disposition  [1] MILD-MODERATE pain AND [2] constant AND [3] present > 2 hours  Answer Assessment - Initial Assessment Questions 1. LOCATION: Where does it hurt?      Pelvic pain 2. RADIATION: Does the pain shoot anywhere else? (e.g., lower back, groin, thighs)     Down  her legs 3. ONSET: When did the pain begin? (e.g., minutes, hours or days ago)      Early September 4. SUDDEN: Gradual or sudden onset?     sudden 5. PATTERN Does the pain come and go, or is it constant?     constant 6. SEVERITY: How bad is the pain?  (e.g., Scale 1-10; mild, moderate, or severe)     severe 7. RECURRENT SYMPTOM: Have you ever had this type of pelvic pain before? If Yes, ask: When was the last time? and What happened that time?      No - ongoing 8. CAUSE: What do you think is causing the pelvic pain?     unsure 9. RELIEVING/AGGRAVATING FACTORS: What makes it better or worse? (e.g., activity/rest, sexual intercourse, voiding, passing stool)     Pain medication 10. OTHER SYMPTOMS: Has there been any other symptoms? (e.g., fever, constipation, diarrhea, urine problems, vaginal bleeding, vaginal discharge, or vomiting?        no  Protocols used: Pelvic Pain - Female-A-AH

## 2024-02-07 DIAGNOSIS — S0990XA Unspecified injury of head, initial encounter: Secondary | ICD-10-CM | POA: Diagnosis not present

## 2024-02-07 DIAGNOSIS — E119 Type 2 diabetes mellitus without complications: Secondary | ICD-10-CM | POA: Diagnosis not present

## 2024-02-07 DIAGNOSIS — I10 Essential (primary) hypertension: Secondary | ICD-10-CM | POA: Diagnosis not present

## 2024-02-07 DIAGNOSIS — S72001A Fracture of unspecified part of neck of right femur, initial encounter for closed fracture: Secondary | ICD-10-CM | POA: Diagnosis not present

## 2024-02-07 DIAGNOSIS — D649 Anemia, unspecified: Secondary | ICD-10-CM | POA: Diagnosis not present

## 2024-02-08 DIAGNOSIS — S72001A Fracture of unspecified part of neck of right femur, initial encounter for closed fracture: Secondary | ICD-10-CM | POA: Diagnosis not present

## 2024-02-08 DIAGNOSIS — Z96641 Presence of right artificial hip joint: Secondary | ICD-10-CM | POA: Diagnosis not present

## 2024-02-08 DIAGNOSIS — E119 Type 2 diabetes mellitus without complications: Secondary | ICD-10-CM | POA: Diagnosis not present

## 2024-02-08 DIAGNOSIS — I10 Essential (primary) hypertension: Secondary | ICD-10-CM | POA: Diagnosis not present

## 2024-02-08 DIAGNOSIS — S72091A Other fracture of head and neck of right femur, initial encounter for closed fracture: Secondary | ICD-10-CM | POA: Diagnosis not present

## 2024-02-08 DIAGNOSIS — Z471 Aftercare following joint replacement surgery: Secondary | ICD-10-CM | POA: Diagnosis not present

## 2024-02-09 DIAGNOSIS — S72001A Fracture of unspecified part of neck of right femur, initial encounter for closed fracture: Secondary | ICD-10-CM | POA: Diagnosis not present

## 2024-02-09 DIAGNOSIS — I10 Essential (primary) hypertension: Secondary | ICD-10-CM | POA: Diagnosis not present

## 2024-02-14 NOTE — Progress Notes (Signed)
 Pt was not seen at this visit. Pt cancelled appointment and no services rendered.

## 2024-02-20 ENCOUNTER — Ambulatory Visit: Admitting: Medical-Surgical

## 2024-02-20 NOTE — Progress Notes (Deleted)
        Established patient visit   History of Present Illness   Discussed the use of AI scribe software for clinical note transcription with the patient, who gave verbal consent to proceed.  History of Present Illness           Physical Exam   Physical Exam Assessment & Plan   Problem List Items Addressed This Visit   None  Assessment and Plan             Follow up   No follow-ups on file. __________________________________ Zada FREDRIK Palin, DNP, APRN, FNP-BC Primary Care and Sports Medicine [facility] [place]

## 2024-02-28 DIAGNOSIS — M25551 Pain in right hip: Secondary | ICD-10-CM | POA: Diagnosis not present

## 2024-03-04 NOTE — Progress Notes (Deleted)
° °       Established patient visit   History of Present Illness   Discussed the use of AI scribe software for clinical note transcription with the patient, who gave verbal consent to proceed.  Admitted 11/18 with right femoral neck fracture, displaced, requiring surgical intervention Discharged to [facility] 12/2 for rehab History of Present Illness           Physical Exam   Physical Exam Assessment & Plan   Problem List Items Addressed This Visit   None Visit Diagnoses       Hospital discharge follow-up    -  Primary      Assessment and Plan             Follow up   No follow-ups on file. __________________________________ Zada FREDRIK Palin, DNP, APRN, FNP-BC Primary Care and Sports Medicine [facility] [place]

## 2024-03-05 ENCOUNTER — Telehealth: Payer: Self-pay

## 2024-03-05 ENCOUNTER — Inpatient Hospital Stay: Admitting: Medical-Surgical

## 2024-03-05 DIAGNOSIS — Z09 Encounter for follow-up examination after completed treatment for conditions other than malignant neoplasm: Secondary | ICD-10-CM

## 2024-03-05 NOTE — Transitions of Care (Post Inpatient/ED Visit) (Unsigned)
° °  03/05/2024  Name: Emily Hendricks MRN: [id number] DOB: 1955/11/05  Today's TOC FU Call Status: Today's TOC FU Call Status:: Unsuccessful Call (1st Attempt) Unsuccessful Call (1st Attempt) Date: 03/05/24  Attempted to reach the patient regarding the most recent Inpatient/ED visit.  Follow Up Plan: Additional outreach attempts will be made to reach the patient to complete the Transitions of Care (Post Inpatient/ED visit) call.   Signature Julian Lemmings, LPN [facility] Nurse Health Advisor Direct Dial [phone]

## 2024-03-07 NOTE — Transitions of Care (Post Inpatient/ED Visit) (Signed)
° °  03/07/2024  Name: Emily Hendricks MRN: [id number] DOB: 1955/10/20  Today's TOC FU Call Status: Today's TOC FU Call Status:: Unsuccessful Call (1st Attempt) Unsuccessful Call (1st Attempt) Date: 03/05/24  Attempted to reach the patient regarding the most recent Inpatient/ED visit.  Follow Up Plan: No further outreach attempts will be made at this time. We have been unable to contact the patient. Patient already seen in office Signature Julian Lemmings, LPN [facility] Nurse Health Advisor Direct Dial [phone]

## 2024-04-06 ENCOUNTER — Other Ambulatory Visit: Payer: Self-pay | Admitting: Medical-Surgical

## 2024-04-11 ENCOUNTER — Ambulatory Visit: Payer: Self-pay

## 2024-04-11 NOTE — Telephone Encounter (Signed)
 Patient scheduled for tomorrow 04/12/2024 with Joy Jessup, NP

## 2024-04-11 NOTE — Telephone Encounter (Signed)
 FYI Only or Action Required?: FYI only for provider: appointment scheduled on 04/12/2024 at 3:20pm with Zada Palin NP.  Patient was last seen in primary care on 01/06/2024 by Palin Zada, NP.  Called Nurse Triage reporting Abdominal Pain.  Symptoms began after surgery in November.  Interventions attempted: Rest, hydration, or home remedies.  Symptoms are: unchanged.  Triage Disposition: See Physician Within 24 Hours  Patient/caregiver understands and will follow disposition?: Yes           Message from Mercer PEDLAR sent at 04/11/2024  9:47 AM EST  Reason for Triage: Patient is having hip pain. She had hip replacement surgery in November.   Reason for Disposition  [1] MODERATE pain (e.g., interferes with normal activities) AND [2] pain comes and goes (cramps) AND [3] present > 24 hours  (Exception: Pain with Vomiting or Diarrhea - see that Guideline.)  Answer Assessment - Initial Assessment Questions Daughter called in for patient  She advised that the patient has an appointment at the end of the month with her PCP for a follow up Daughter advised that she wanted to see if her mother, the patient, could be fit in tomorrow or the next day due to pain.  Patient's contact number is [phone] This RN called the patient with the assistance of interpreter services: Hindi interpreter  Sonam  ID [phone]  Patient states she has a problem when she walks Patient states when she eats she will experience nausea & her stomach will start hurting & loss of appetite Frequent urination--waking at night 5-6 times to urinate Also any beverages give her pain in her stomach as well Pain will be in the center of her abdomen 5 out of 10 pain when her abdomen hurts Patient states that this started after surgery She denies any blood in her stools or vomiting any  Patient denies chest pain, difficulty breathing, any known fevers, no drainage or problems at the surgery site, calf pain  During  triage, an appointment was made for the patient for a physical tomorrow with her PCP through MyChart  This RN called CAL and inquired about the appointment that popped up on the patient's scheduled appointments due to it being a physical type CAL changed this appointment type to ACUTE for the patient's visit after this RN advised her of the symptoms the patient was having This RN advised the patient of the updated appointment for tomorrow and then advised that if she had any questions to call back and if anything got worse to go to the ER or call 911 Patient verbalized understanding. This RN also called the daughter and left a voicemail and advised for her to call us  back as well with any questions.  Protocols used: Abdominal Pain - Female-A-AH

## 2024-04-12 ENCOUNTER — Ambulatory Visit: Admitting: Medical-Surgical

## 2024-04-12 ENCOUNTER — Encounter: Payer: Self-pay | Admitting: Medical-Surgical

## 2024-04-12 ENCOUNTER — Ambulatory Visit

## 2024-04-12 VITALS — BP 102/68 | HR 82 | Temp 97.8°F | Resp 20 | Ht 62.0 in | Wt 105.1 lb

## 2024-04-12 DIAGNOSIS — R1084 Generalized abdominal pain: Secondary | ICD-10-CM | POA: Diagnosis not present

## 2024-04-12 DIAGNOSIS — R54 Age-related physical debility: Secondary | ICD-10-CM | POA: Diagnosis not present

## 2024-04-12 DIAGNOSIS — M25561 Pain in right knee: Secondary | ICD-10-CM

## 2024-04-12 DIAGNOSIS — G8929 Other chronic pain: Secondary | ICD-10-CM

## 2024-04-12 LAB — POCT URINALYSIS DIP (CLINITEK)
Blood, UA: NEGATIVE
Glucose, UA: NEGATIVE mg/dL
Nitrite, UA: NEGATIVE
Spec Grav, UA: >=1.03 — AB
Urobilinogen, UA: 1 U/dL
pH, UA: 6

## 2024-04-12 MED ORDER — ONDANSETRON 4 MG PO TBDP: 4.0000 mg | ORAL_TABLET | Freq: Three times a day (TID) | ORAL | 0 refills | Status: AC | PRN

## 2024-04-12 MED ORDER — PANTOPRAZOLE SODIUM 40 MG PO TBEC: 40.0000 mg | DELAYED_RELEASE_TABLET | Freq: Every day | ORAL | 0 refills | Status: DC

## 2024-04-12 NOTE — Progress Notes (Signed)
 "       Established patient visit   History of Present Illness   Discussed the use of AI scribe software for clinical note transcription with the patient, who gave verbal consent to proceed.  History of Present Illness   Emily Hendricks is a 69 year old female who presents with abdominal pain and decreased appetite.  Abdominal pain and gastrointestinal symptoms - Persistent central burning abdominal pain, starting after her hip replacement surgery - Nausea without vomiting - Postprandial discomfort - Markedly decreased appetite - Early satiety, feeling full after only a few bites - No diarrhea - No changes in bowel movement frequency or color  Urinary symptoms - Frequent nocturnal urination despite limiting evening fluids  Constitutional symptoms - Chills and sensation of fever without documented fever  Musculoskeletal symptoms - History of right hip replacement after a fall - Intermittent right knee pain - Uses Aleve for pain management - Mobility issues requiring assistance with showering and use of a walker  Medication tolerance issues - Difficulty taking alendronate  correctly due to inability to comfortably drink a full glass of water     Physical Exam   Physical Exam Vitals reviewed.  Constitutional:      General: She is not in acute distress.    Appearance: Normal appearance.  HENT:     Head: Normocephalic and atraumatic.  Cardiovascular:     Rate and Rhythm: Normal rate and regular rhythm.     Pulses: Normal pulses.     Heart sounds: Normal heart sounds. No murmur heard.    No friction rub. No gallop.  Pulmonary:     Effort: Pulmonary effort is normal. No respiratory distress.     Breath sounds: Normal breath sounds. No wheezing.  Abdominal:     General: Abdomen is flat. Bowel sounds are normal. There is no distension.     Palpations: Abdomen is soft.     Tenderness: There is generalized abdominal tenderness. There is guarding. There is no rebound.  Negative signs include Murphy's sign, Rovsing's sign and McBurney's sign.     Hernia: No hernia is present.  Skin:    General: Skin is warm and dry.  Neurological:     Mental Status: She is alert and oriented to person, place, and time.  Psychiatric:        Mood and Affect: Mood normal.        Behavior: Behavior normal.        Thought Content: Thought content normal.        Judgment: Judgment normal.    Assessment & Plan   Generalized abdominal pain with anorexia and weight loss Abdominal pain with burning, nausea, and anorexia causing weight loss. Differential includes GI, liver, or gallbladder issues. Urinalysis suggests dehydration. Elevated bilirubin and urobilinogen indicate possible liver or gallbladder involvement. Considered H. pylori infection. Aleve and Fosamax  may cause gastric irritation. - Ordered blood work including CBC, CMP, Amylase, Lipase, B12 and iron  levels. - Ordered H. pylori breath test. - Prescribed Protonix  40 mg daily. - Ordered abdominal x-ray to assess for stool burden. - Recommended dairy-free protein shakes for boosting nutritional intake. - Ensure Fosamax  is taken with a full glass of water then remain upright for at least 30 minutes.  Chronic right knee pain Intermittent exacerbations. Aleve may cause gastric irritation. - Hold Aleve and other NSAIDs.  - Recommended Voltaren  gel for topical anti-inflammatory treatment.  Age-related physical debility Increased fall risk. Concerns about mobility and home safety. Discussed stair assist devices  and medical transportation for physical therapy. - Referred to in person physical therapy for mobility assistance. - Recommended stair assist devices for safer mobility.    Follow up   Return in about 4 weeks (around 05/10/2024) for abdominal pain follow up. __________________________________ Zada FREDRIK Palin, DNP, APRN, FNP-BC Primary Care and Sports Medicine [facility] [place] "

## 2024-04-13 ENCOUNTER — Ambulatory Visit: Payer: Self-pay | Admitting: Medical-Surgical

## 2024-04-13 DIAGNOSIS — D649 Anemia, unspecified: Secondary | ICD-10-CM

## 2024-04-14 LAB — CBC WITH DIFFERENTIAL/PLATELET
Basophils Absolute: 0 10*3/uL (ref 0.0–0.2)
Basos: 0 %
EOS (ABSOLUTE): 0 10*3/uL (ref 0.0–0.4)
Eos: 1 %
Hematocrit: 29.9 % — ABNORMAL LOW (ref 34.0–46.6)
Hemoglobin: 9 g/dL — ABNORMAL LOW (ref 11.1–15.9)
Immature Grans (Abs): 0 10*3/uL (ref 0.0–0.1)
Immature Granulocytes: 0 %
Lymphocytes Absolute: 1.1 10*3/uL (ref 0.7–3.1)
Lymphs: 41 %
MCH: 26.9 pg (ref 26.6–33.0)
MCHC: 30.1 g/dL — ABNORMAL LOW (ref 31.5–35.7)
MCV: 90 fL (ref 79–97)
Monocytes Absolute: 0.2 10*3/uL (ref 0.1–0.9)
Monocytes: 9 %
NRBC: 1 % — ABNORMAL HIGH (ref 0–0)
Neutrophils Absolute: 1.3 10*3/uL — ABNORMAL LOW (ref 1.4–7.0)
Neutrophils: 49 %
Platelets: 202 10*3/uL (ref 150–450)
RBC: 3.34 x10E6/uL — ABNORMAL LOW (ref 3.77–5.28)
RDW: 15.2 % (ref 11.7–15.4)
WBC: 2.7 10*3/uL — ABNORMAL LOW (ref 3.4–10.8)

## 2024-04-14 LAB — COMPREHENSIVE METABOLIC PANEL WITH GFR
ALT: 22 [IU]/L (ref 0–32)
AST: 41 [IU]/L — ABNORMAL HIGH (ref 0–40)
Albumin: 4 g/dL (ref 3.9–4.9)
Alkaline Phosphatase: 131 [IU]/L (ref 49–135)
BUN/Creatinine Ratio: 25 (ref 12–28)
BUN: 23 mg/dL (ref 8–27)
Bilirubin Total: 0.3 mg/dL (ref 0.0–1.2)
CO2: 19 mmol/L — ABNORMAL LOW (ref 20–29)
Calcium: 9.1 mg/dL (ref 8.7–10.3)
Chloride: 104 mmol/L (ref 96–106)
Creatinine, Ser: 0.92 mg/dL (ref 0.57–1.00)
Globulin, Total: 2.6 g/dL (ref 1.5–4.5)
Glucose: 97 mg/dL (ref 70–99)
Potassium: 4.3 mmol/L (ref 3.5–5.2)
Sodium: 138 mmol/L (ref 134–144)
Total Protein: 6.6 g/dL (ref 6.0–8.5)
eGFR: 67 mL/min/{1.73_m2}

## 2024-04-14 LAB — VITAMIN B12: Vitamin B-12: 173 pg/mL — ABNORMAL LOW (ref 232–1245)

## 2024-04-14 LAB — AMYLASE: Amylase: 88 U/L (ref 31–110)

## 2024-04-14 LAB — IRON,TIBC AND FERRITIN PANEL
Ferritin: 748 ng/mL — ABNORMAL HIGH (ref 15–150)
Iron Saturation: 21 % (ref 15–55)
Iron: 63 ug/dL (ref 27–139)
Total Iron Binding Capacity: 304 ug/dL (ref 250–450)
UIBC: 241 ug/dL (ref 118–369)

## 2024-04-14 LAB — H. PYLORI BREATH TEST: H pylori Breath Test: POSITIVE — AB

## 2024-04-14 LAB — LIPASE: Lipase: 46 U/L (ref 14–72)

## 2024-04-15 ENCOUNTER — Encounter: Payer: Self-pay | Admitting: Medical-Surgical

## 2024-04-16 LAB — URINE CULTURE

## 2024-04-16 MED ORDER — METRONIDAZOLE 500 MG PO TABS: 500.0000 mg | ORAL_TABLET | Freq: Four times a day (QID) | ORAL | 0 refills | Status: AC

## 2024-04-16 MED ORDER — PANTOPRAZOLE SODIUM 40 MG PO TBEC: 40.0000 mg | DELAYED_RELEASE_TABLET | Freq: Two times a day (BID) | ORAL | 0 refills | Status: AC

## 2024-04-16 MED ORDER — BISMUTH SUBSALICYLATE 525 MG/15ML PO SUSP: 525.0000 mg | Freq: Four times a day (QID) | ORAL | 0 refills | Status: AC

## 2024-04-16 MED ORDER — TETRACYCLINE HCL 500 MG PO CAPS: 500.0000 mg | ORAL_CAPSULE | Freq: Four times a day (QID) | ORAL | 0 refills | Status: DC

## 2024-04-17 NOTE — Telephone Encounter (Signed)
 Patient daughter - [name] informed of results .she will also review these on Mychart once she is home as she is at work at the time of the call. She will contact us  with any questions regarding these.

## 2024-04-19 ENCOUNTER — Telehealth: Payer: Self-pay

## 2024-04-19 ENCOUNTER — Other Ambulatory Visit: Payer: Self-pay | Admitting: Medical-Surgical

## 2024-04-19 MED ORDER — DOXYCYCLINE HYCLATE 100 MG PO TABS: 100.0000 mg | ORAL_TABLET | Freq: Two times a day (BID) | ORAL | 0 refills | Status: AC

## 2024-04-19 NOTE — Telephone Encounter (Signed)
 Spoke to patients daughter to cancel the appointment that was scheduled for tomorrow from 02/2024.  The daughter asked about the antibiotic that was sent to the pharmacy for H pylori. She said that it cost $800. I contacted [facility] and they stated that the medication Tetracycline  needs a prior authorization and that she is on Medicaid.

## 2024-04-20 ENCOUNTER — Ambulatory Visit: Admitting: Medical-Surgical

## 2024-04-23 ENCOUNTER — Ambulatory Visit: Payer: Self-pay

## 2024-04-23 NOTE — Telephone Encounter (Signed)
 FYI Only or Action Required?: Action required by provider: update on patient condition.  Patient was last seen in primary care on 04/12/2024 by Emily Mini, NP.  Called Nurse Triage reporting Vomiting.  Symptoms began several days ago.  Interventions attempted: Nothing.  Symptoms are: gradually worsening.Daughter states since starting antibiotic pt. Has had vomiting and diarrhea, 1-3 x day.Nausea. Asking for advice. Office closed due to weather.Instructed to go to ED For worsening of symptoms.  Triage Disposition: Call PCP Within 24 Hours  Patient/caregiver understands and will follow disposition?: YesReason for Triage: Patient's daughter [name] is calling, she is on [place]. Patient has been taking metronidazole  and doxycycline , has not been able to eat anything since starting the doxycycline , is nauseous, throwing up, and experiencing diarrhea. Also feeling really weak, when she wakes up, is only up for a short period of time and then goes right back to sleep.    Call History  Reason for Disposition  Vomiting a prescription medication  Answer Assessment - Initial Assessment Questions 1. VOMITING SEVERITY: How many times have you vomited in the past 24 hours?      1-2 x day 2. ONSET: When did the vomiting begin?      With antibiotics 3. FLUIDS: What fluids or food have you vomited up today? Have you been able to keep any fluids down?     yes 4. ABDOMEN PAIN: Are your having any abdomen pain? If Yes : How bad is it and what does it feel like? (e.g., crampy, dull, intermittent, constant)      no 5. DIARRHEA: Is there any diarrhea? If Yes, ask: How many times today?      After eating 6. CONTACTS: Is there anyone else in the family with the same symptoms?      no 7. CAUSE: What do you think is causing your vomiting?     Maybe antibiotics 8. HYDRATION STATUS: Any signs of dehydration? (e.g., dry mouth [not only dry lips], too weak to stand) When did you last  urinate?     no 9. OTHER SYMPTOMS: Do you have any other symptoms? (e.g., fever, headache, vertigo, vomiting blood or coffee grounds, recent head injury)     nausea 10. PREGNANCY: Is there any chance you are pregnant? When was your last menstrual period?       no  Protocols used: Vomiting-A-AH

## 2024-04-23 NOTE — H&P (Addendum)
 [facility]  History and Physical   Assessment    Patient is a 69 year old female, with a  PMH of Breast Cancer, Non-Insulin Dependent DM type 2, Essential HTN, and HLD, who presents to to the hospital for Starvation Ketoacidosis.   Plan  # Starvation Ketoacidosis- AGAP is elevated at 28 with BHB elevation of 8.6. Suspect secondary starvation ketosis as BS was 53 on presentation. Will start D5 LR at 100 cc/hr and trend BS every 2 hours. Will trend BMP every 4 hours to monitor for GAP closure.   #  AKI -Cr is elevated at 1.30 with baseline of 0.6 to 0.7. Suspect secondary to pre-renal. CT AP was negative for urinary obstruction. Will start IVF per above and trend daily Cr. Will renally dose all meds.    # Liver Mass- CT AP was concerning for multiple liver masses. Will need to talk with family in the AM in regards of biopsying a mass to determine etiology.   # Non-Insulin Dependent Dm type 2 With Hypoglycemia Without Coma - Hg A1C is well controlled at 5.9%. Will hold metformin  500 mg daily    # Essential HTN- Will hold Losartan  50 mg daily due to AKI.   #  Dyslipidemia- Will hold Simvastatin  40 mg QHS due to liver masses   # Anxiety and Depression- Will continue Zoloft  100 mg daily and Topamax  25 mg daily  DVT- Heparin Diet- Regular IVF- D5 LR at 100 cc/hr   Expected length of stay greater than 2 midnights admitted to the hospital under inpatient status for  medical complexity including Starvation Ketosis and AKI Requiring  IV D5 LR, and Q4 hour BMP Checks     Fluid and electrolyte disorder: N/A, present on admission  Plan: N/A  Abnormal blood counts: N/A, present on admission Plan: N/A  Nutritional status: Body mass index is 22.5 kg/m. N/A Plan: N/A Regular Diet  Coagulation defect: N/A, present on admission Plan: N/A  Elevated LFTs/transaminitis due to: N/A, present on admission Plan: N/A  Debility: N/A, present on admission Plan:  N/A ODE STATUS:   History  CC- Abdominal Pain  HPI- Patient is a 69 year old female, with a  PMH of Breast Cancer, Non-Insulin Dependent DM type 2, Essential HTN, and HLD, who presents to to the hospital for abdominal pain. Patient was very poor historian even with the aid of translator services. Supplemental history was obtained by chart review. Patient reported having decrease oral intake recently and developed non specific abdominal pain on 04-23-2024 prompting her to seek medical care..   Personally Reviewed Dr[facility] Discharge Note on 02-21-2024 for Hip Fracture  Personally Reviewed CT Head- No Acute Intracranial bleeding, masses, or midline shift.   Past Medical History:  Diagnosis Date   Cancer (*)    Diabetes mellitus (*)    Diabetes mellitus, type 2 (*)    Hypertension    Psychiatric disorder    Past Surgical History:  Procedure Laterality Date   Breast surgery      Allergies[1] Prior to Admission medications  Medication Sig Start Date End Date Taking? Authorizing Provider  alendronate  (FOSAMAX ) 70 mg tablet Take one tablet (70 mg dose) by mouth once a week at 0900. 12/06/23   Historical Provider, MD  losartan  potassium (COZAAR ) 50 mg tablet Hold for now (low blood pressure) 02/11/24   Delon Carleen Opal, PA-C  metFORMIN  (GLUCOPHAGE ) 500 mg tablet Hold for now to avoid low blood sugars. 02/11/24  Delon Carleen Opal, PA-C  sertraline  (ZOLOFT ) 100 mg tablet Take one tablet (100 mg dose) by mouth daily. 11/16/23   Historical Provider, MD  simvastatin  (ZOCOR ) 40 mg tablet Take one tablet (40 mg dose) by mouth at bedtime. 11/27/23   Historical Provider, MD  topiramate  (TOPAMAX ) 25 MG tablet Take one tablet (25 mg dose) by mouth daily. 11/16/23   Historical Provider, MD   Social History[2] Family History[3] Review of Systems  Unable to perform ROS: Other [employer])        Physical Examination  Temp:  [97.6 F (36.4 C)] 97.6 F (36.4  C) Heart Rate:  [76-86] 76 Resp:  [17] 17 BP: (88-126)/(50-63) 94/50 SpO2:  [99 %-100 %] 99 % Pain Score:   8 Adult Faces: 0 O2 Device: None (Room air) No data recorded  Physical Exam Vitals reviewed.  Constitutional:      General: She is not in acute distress.    Appearance: Normal appearance. She is normal weight. She is not ill-appearing.  HENT:     Head: Normocephalic and atraumatic.  Eyes:     General:        Right eye: No discharge.        Left eye: No discharge.     Extraocular Movements: Extraocular movements intact.     Conjunctiva/sclera: Conjunctivae normal.  Cardiovascular:     Rate and Rhythm: Normal rate and regular rhythm.     Heart sounds: Normal heart sounds. No murmur heard.    No friction rub.  Musculoskeletal:        General: No deformity or signs of injury.     Cervical back: Neck supple. No tenderness.     Right lower leg: No edema.     Left lower leg: No edema.  Pulmonary:     Effort: Pulmonary effort is normal. No respiratory distress.     Breath sounds: Normal breath sounds. No stridor. No wheezing.  Abdominal:     General: Abdomen is flat. Bowel sounds are normal. There is no distension.     Palpations: Abdomen is soft. There is no mass.     Tenderness: There is no abdominal tenderness.  Lymphadenopathy:     Cervical: No cervical adenopathy.  Skin:    General: Skin is warm and dry.     Coloration: Skin is not jaundiced or pale.     Findings: No bruising.  Neurological:     General: No focal deficit present.     Mental Status: She is alert. Mental status is at baseline.     Cranial Nerves: No cranial nerve deficit.  Psychiatric:        Mood and Affect: Mood normal.        Behavior: Behavior normal.        Thought Content: Thought content normal.        Judgment: Judgment normal.   Results  Labs:  Recent Results (from the past 24 hours)  POCT Glucose   Collection Time: 04/23/24  7:04 PM  Result Value Ref Range   Glucose, POC 47  (LL) 70 - 99 mg/dL   OPERATOR ID [id number]    INSTRUMENT ID [id number]   POCT Glucose   Collection Time: 04/23/24  7:34 PM  Result Value Ref Range   Glucose, POC 54 (LL) 70 - 99 mg/dL   OPERATOR ID [id number]    INSTRUMENT ID [id number]   CBC And Differential   Collection Time: 04/23/24  7:45 PM  Result Value Ref Range  WBC 3.8 (L) 4.0 - 10.5 10e3cells/uL   RBC 3.47 (L) 3.93 - 5.22 10e6cells/uL   HGB 9.6 (L) 11.2 - 15.7 gm/dL   HCT 67.9 (L) 65.8 - 55.0 %   MCV 92.2 79.4 - 94.8 fL   MCH 27.7 25.6 - 32.2 pg   MCHC 30.0 (L) 32.2 - 35.5 gm/dL   Plt Ct 759 849 - 599 10e3cells/uL   RDW SD 57.5 (H) 35.1 - 46.3 fL   MPV 9.6 9.4 - 12.4 fL   NRBC% 0.8 (H) 0.0 - 0.2 /100WBC   Absolute NRBC Count 0.03 (H) 0.00 - 0.01 10e3cells/uL   NEUTROPHIL % 61.8 %   LYMPHOCYTE % 28.1 %   MONOCYTE % 7.7 %   Eosinophil % 0.3 %   BASOPHIL % 0.5 %   IG% 1.6 %   Absolute Neutrophil Count 2.33 1.50 - 7.50 10e3cells/uL   Absolute Lymphocyte Count 1.06 1.00 - 4.50 10e3cells/uL   Absolute Monocyte Count 0.29 0.10 - 0.80 10e3cells/uL   Absolute Eosinophil Count 0.01 0.00 - 0.50 10e3cells/uL   Absolute Basophil Count 0.02 0.00 - 0.20 10e3cells/uL   Absolute Immature Granulocyte Count 0.06 (H) 0.00 - 0.03 10e3cells/uL  Comprehensive Metabolic Panel   Collection Time: 04/23/24  7:45 PM  Result Value Ref Range   Na 143 136 - 146 mmol/L   Potassium 4.5 3.7 - 5.4 mmol/L   Cl 104 97 - 108 mmol/L   CO2 11 (L) 20 - 32 mmol/L   AGAP 28 (H) 7 - 16 mmol/L   Glucose 53 (LL) 65 - 99 mg/dL   BUN 34 (H) 8 - 27 mg/dL   Creatinine 8.69 (H) 9.42 - 1.00 mg/dL   Ca 9.3 8.6 - 89.7 mg/dL   ALK PHOS 867 25 - 834 IU/L   T Bili 0.3 0.0 - 1.2 mg/dL   Total Protein 6.8 6.0 - 8.5 gm/dL   Alb 3.7 3.6 - 4.8 gm/dL   GLOBULIN 3.1 1.5 - 4.5 gm/dL   ALBUMIN/GLOBULIN RATIO 1.2 1.1 - 2.5   BUN/CREAT RATIO 26 11 - 26   ALT 27 0 - 40 IU/L   AST 52 (H) 0 - 40 IU/L   eGFR 45 (L) >=60 mL/min/1.20m2  Magnesium   Collection Time:  04/23/24  7:45 PM  Result Value Ref Range   Mg 2.3 1.6 - 2.6 mg/dL  Lipase   Collection Time: 04/23/24  7:45 PM  Result Value Ref Range   Lipase 30 0 - 59 U/L  Beta-Hydroxybutyric Acid   Collection Time: 04/23/24  7:45 PM  Result Value Ref Range   BHB(Beta-Hydroxybutyrate) 8.6 (H) <0.3 mM/L  Hemoglobin A1c   Collection Time: 04/23/24  7:45 PM  Result Value Ref Range   Hemoglobin A1c 5.9 (H) 4.8 - 5.6 %  Phosphorus every 12 hours while on insulin infusion   Collection Time: 04/23/24  7:45 PM  Result Value Ref Range   Phos 4.3 2.5 - 4.5 mg/dL  RNCPI-80, Flu A+B and RSV Nasopharyngeal Swab Nasopharyngeal Swab   Collection Time: 04/23/24  7:55 PM  Result Value Ref Range   Flu A Negative Negative   Flu B Negative Negative   RSV PCR Negative Negative   SARS-COV-2 Not Detected Not Detected  POCT Glucose   Collection Time: 04/23/24  7:59 PM  Result Value Ref Range   Glucose, POC 211 (H) 70 - 99 mg/dL   OPERATOR ID [id number]    INSTRUMENT ID [id number]   Blood Gas, Venous   Collection Time:  04/23/24  9:08 PM  Result Value Ref Range   PH VENOUS 7.226 (L) 7.310 - 7.410   PCO2 VENOUS 27.8 (L) 40.0 - 50.0 mmHg   PO2 VENOUS 40.4 40.0 - 50.0 mmHg   BASE EXCESS VENOUS -14.7 (L) -2.0 - 2.0 mmol/L   BICARBONATE VENOUS 12 (LL) 21 - 28 mmol/L   PATIENT TEMPERATURE 37.0 Deg   OXYGEN SATURATION MEASURED VEN 69.6 %  Lactic Acid - Yes reflex 2 hour   Collection Time: 04/23/24  9:08 PM  Result Value Ref Range   Lactic Acid 1.2 0.5 - 2.0 mmol/L  POCT Glucose   Collection Time: 04/23/24  9:17 PM  Result Value Ref Range   Glucose, POC 166 (H) 70 - 99 mg/dL   OPERATOR ID [id number]    INSTRUMENT ID [id number]   POCT Glucose   Collection Time: 04/23/24  9:59 PM  Result Value Ref Range   Glucose, POC 135 (H) 70 - 99 mg/dL   OPERATOR ID [id number]    INSTRUMENT ID [id number]   POCT Glucose   Collection Time: 04/23/24 11:41 PM  Result Value Ref Range   Glucose, POC 69 (L) 70 - 99 mg/dL    OPERATOR ID [id number]    INSTRUMENT ID [id number]    Imaging: CT Head WO Contrast Result Date: 04/23/2024 COMPARISON: 02/07/2024 INDICATION: N/V/D epigastric pain breast cancer TECHNIQUE:  CT ABDOMEN PELVIS W IV CONTRAST, CT HEAD WO CONTRAST   60 mL  IOPAMIDOL 76 % IV SOLN Dose reduction was utilized (automated exposure control, mA or kV adjustment based on patient size, or iterative image reconstruction). FINDINGS: CT HEAD No intracranial hemorrhage identified.  No midline shift or acute mass effect.  No abnormal extra-axial fluid collections are seen.  Ventricles, cisterns, and sulci are unremarkable. Moderate periventricular and deep white matter hypodensities, statistically small vessel ischemic disease.  Visualized orbits are unremarkable.  Visualized paranasal sinuses are clear.  Visualized mastoid air cells are clear.  No displaced or depressed calvarial fracture identified. CT ABDOMEN PELVIS Exam Limitations: Motion artifact. Lower Chest: Mild bilateral basilar subsegmental atelectasis. Liver: Numerous liver metastases, measuring up to 4 cm in diameter along left lateral margin of posterior right liver lobe.  Mild hepatomegaly. Gallbladder and Biliary Tree: Unremarkable Pancreas: Unremarkable Spleen: Unremarkable Adrenal Glands: Unremarkable Kidneys and Urinary Bladder: Unremarkable Reproductive: Unremarkable Vasculature: Aortoiliac atherosclerosis without aneurysm. Lymph Nodes: Unremarkable Peritoneum: No free fluid. No free air.  No abscess. Gastrointestinal Tract: Multiple mildly distended fluid-filled loops of small bowel with air-fluid levels.  No bowel obstruction.  No evidence for acute appendicitis.  Colonic diverticulosis.  Mild diffuse colonic bowel wall thickening. Abdominal and Pelvic Walls: Mild diffuse body wall edema. Bones: Multilevel spondylosis.  Right total hip arthroplasty.  Nonspecific diffuse bony sclerosis.   IMPRESSION: CT HEAD 1.  No acute intracranial abnormality identified.  2.  No calvarial fracture identified. CT ABDOMEN PELVIS 1.  Numerous liver metastases. 2.  Nonspecific enteritis/pancolitis. 3.  Mild anasarca. Electronically Signed by: Charlie Patch, MD on 04/23/2024 10:26 PM  CT Abdomen Pelvis W IV Contrast Result Date: 04/23/2024 COMPARISON: 02/07/2024 INDICATION: N/V/D epigastric pain breast cancer TECHNIQUE:  CT ABDOMEN PELVIS W IV CONTRAST, CT HEAD WO CONTRAST   60 mL  IOPAMIDOL 76 % IV SOLN Dose reduction was utilized (automated exposure control, mA or kV adjustment based on patient size, or iterative image reconstruction). FINDINGS: CT HEAD No intracranial hemorrhage identified.  No midline shift or acute mass effect.  No abnormal extra-axial fluid collections are seen.  Ventricles, cisterns, and sulci are unremarkable. Moderate periventricular and deep white matter hypodensities, statistically small vessel ischemic disease.  Visualized orbits are unremarkable.  Visualized paranasal sinuses are clear.  Visualized mastoid air cells are clear.  No displaced or depressed calvarial fracture identified. CT ABDOMEN PELVIS Exam Limitations: Motion artifact. Lower Chest: Mild bilateral basilar subsegmental atelectasis. Liver: Numerous liver metastases, measuring up to 4 cm in diameter along left lateral margin of posterior right liver lobe.  Mild hepatomegaly. Gallbladder and Biliary Tree: Unremarkable Pancreas: Unremarkable Spleen: Unremarkable Adrenal Glands: Unremarkable Kidneys and Urinary Bladder: Unremarkable Reproductive: Unremarkable Vasculature: Aortoiliac atherosclerosis without aneurysm. Lymph Nodes: Unremarkable Peritoneum: No free fluid. No free air.  No abscess. Gastrointestinal Tract: Multiple mildly distended fluid-filled loops of small bowel with air-fluid levels.  No bowel obstruction.  No evidence for acute appendicitis.  Colonic diverticulosis.  Mild diffuse colonic bowel wall thickening. Abdominal and Pelvic Walls: Mild diffuse body wall edema. Bones:  Multilevel spondylosis.  Right total hip arthroplasty.  Nonspecific diffuse bony sclerosis.   IMPRESSION: CT HEAD 1.  No acute intracranial abnormality identified. 2.  No calvarial fracture identified. CT ABDOMEN PELVIS 1.  Numerous liver metastases. 2.  Nonspecific enteritis/pancolitis. 3.  Mild anasarca. Electronically Signed by: Charlie Patch, MD on 04/23/2024 10:26 PM  ECG: No results found.  Coding  Electronically signed: Yancy Sorrel, MD 04/23/2024 / 11:45 PM       [1] No Known Allergies [2] Social History Socioeconomic History   Marital status: Married  Tobacco Use   Smoking status: Never   Smokeless tobacco: Never  Substance and Sexual Activity   Alcohol use: Never   Drug use: Never  [3] No family history on file.

## 2024-04-23 NOTE — Telephone Encounter (Signed)
 Contacted the patient's daughter. Notified of the provider's recommendation to take the patient to the ER as soon as possible. Verbalized understanding and agreeable with plan.

## 2024-04-23 NOTE — Telephone Encounter (Signed)
 Called the patient's daughter for scheduling. Per daughter - unable to schedule a virtual appointment this afternoon or later this week due to work obligations.   Per E2C2 Triage RN - Patient's daughter [name] states that the patient has been taking metronidazole  and doxycycline . Has not been able to eat anything since starting the doxycycline , is nauseous, throwing up, and experiencing diarrhea. Also feeling really weak, when she wakes up, is only up for a short period of time and then goes right back to sleep. Please advise, thanks.

## 2024-04-24 ENCOUNTER — Telehealth: Payer: Self-pay | Admitting: *Deleted

## 2024-04-24 NOTE — Care Plan (Signed)
" °  Problem: Discharge Planning Goal: Knowledge of medical problems (What is my main problem?) Outcome: Not Progressing Goal: Knowledge of self care (What do I need to do when I go home?) Outcome: Not Progressing Goal: Knowledge of treatment plan (Why is it important for me to do this?) Outcome: Not Progressing Goal: Knowledge of medication management Outcome: Not Progressing   Problem: Injury Risk, Abnormal Glucose Level Goal: Glucose level within specified parameters Outcome: Not Progressing   Problem: Sensory Perception - Impaired Goal: Absence of physical injury Outcome: Not Progressing   Problem: Deep Venous Thrombosis, Risk of Goal: Absence of deep venous thrombosis Outcome: Not Progressing   Problem: Bleeding, Risk of Goal: Absence of active bleeding Outcome: Not Progressing Goal: Absence of impaired coagulation signs and symptoms Outcome: Not Progressing   Problem: Fall Prevention Goal: Absence of falls Outcome: Not Progressing   Problem: Pressure Injury Goal: Pressure injury healing Outcome: Not Progressing Goal: Absence of new pressure injury Outcome: Not Progressing   "

## 2024-04-24 NOTE — Telephone Encounter (Signed)
 Received call from pt daughter [name] stating pt is currently in the [facility] ED in [place] and had a CT scan which showed possible mass on the pts liver.  States pt needs biopsy and requesting our office to order biopsy.  RN unable to see any information pertaining to ED visit at [facility].  RN asked pt family to obtain CT with image results.  Sweta states they are unable to provide her with CD of scan as well us  unable to perform biopsy but will send referral to cancer center. Pt daughter states pt is scheduled to see PCP this Friday for a hospital f/u and will work with PCP to schedule biopsy. Our office will be notified if biopsy comes back positive.

## 2024-04-24 NOTE — ED Notes (Signed)
 FSBS 122

## 2024-04-24 NOTE — Nursing Note (Signed)
 Pt arrives to 2118 via stretcher from ED. VSS, FSBS obtained and WNL, admission assessment completed via iPad Hindi interpreter [phone].

## 2024-04-24 NOTE — Progress Notes (Signed)
" °  [facility] Diabetes Education Consult-DKA  Patient Name:  Emily Hendricks Date of Birth:  1955-10-18  Today's Date: April 24, 2024  Current Medications  Prior to Admission medications  Medication Sig Start Date End Date Taking? Authorizing Provider  alendronate  (FOSAMAX ) 70 mg tablet Take one tablet (70 mg dose) by mouth once a week at 0900. 12/06/23   Historical Provider, MD  doxycycline  hyclate (VIBRA -TABS) 100 mg tablet Take one tablet (100 mg dose) by mouth 2 (two) times daily.   Yes Historical Provider, MD  HYDROcodone -acetaminophen  (NORCO) 5-325 mg per tablet Take one tablet by mouth. 02/06/24  Yes Historical Provider, MD  losartan  potassium (COZAAR ) 50 mg tablet Hold for now (low blood pressure) 02/11/24  Yes Delon Carleen Opal, PA-C  metFORMIN  (GLUCOPHAGE ) 500 mg tablet Hold for now to avoid low blood sugars. 02/11/24  Yes Delon Carleen Opal, PA-C  metroNIDAZOLE  (FLAGYL ) 500 mg tablet Take one tablet (500 mg dose) by mouth 4 (four) times a day. 04/16/24 04/30/24 Yes Historical Provider, MD  ondansetron  (ZOFRAN -ODT) 4 mg disintegrating tablet Take one tablet (4 mg dose) by mouth every 8 (eight) hours as needed. 04/12/24  Yes Historical Provider, MD  pantoprazole  sodium (PROTONIX ) 40 mg tablet Take one tablet (40 mg dose) by mouth 2 (two) times daily. 04/16/24 04/30/24 Yes Historical Provider, MD  sertraline  (ZOLOFT ) 100 mg tablet Take one tablet (100 mg dose) by mouth daily. 11/16/23   Historical Provider, MD  simvastatin  (ZOCOR ) 40 mg tablet Take one tablet (40 mg dose) by mouth at bedtime. 11/27/23  Yes Historical Provider, MD  topiramate  (TOPAMAX ) 25 MG tablet Take one tablet (25 mg dose) by mouth daily. 11/16/23  Yes Historical Provider, MD     Last A1C Recent Results (from the past 24 weeks)  Hemoglobin A1c   Collection Time: 04/23/24  7:45 PM  Result Value Ref Range   Hemoglobin A1c 5.9 (H) 4.8 - 5.6 %   Last POC BG Lab Results  Component Value  Date/Time   Glucose 123 (H) 04/24/2024 07:58 AM       Overview   Reason for consult: Diabetic Ketoacidosis/ Hyperosmolar/Hyperglycemic Syndrome (HHS)  Consult received for Diabetes Education:DKA  Pt has known Type 2 DM on Metformin  at home.  She was never on an insulin gtt this admission and does not meet criteria for Diabetes Education at this time.  Pt will need to follow up with her PCP at discharge.  Warnell Ada, MSN,CDCES 04/24/2024 / 11:18 AM      "

## 2024-04-26 ENCOUNTER — Inpatient Hospital Stay: Admitting: Medical-Surgical

## 2024-04-26 NOTE — Anesthesia Postprocedure Evaluation (Signed)
" °  Patient: Emily Hendricks Procedures: ESOPHAGOGASTRODUODENOSCOPY (EGD)  Anesthesia type: general  Anesthesia Post Evaluation  Patient location during evaluation: PACU Patient participation: complete - patient participated Level of consciousness: awake and alert Pain management: adequate Airway patency: patent Cardiovascular status: acceptable Respiratory status: acceptable Hydration status: acceptable Vitals: stable Temperature: temperature adequate >96.45F PONV: nausea and vomiting controlled Regional Anesthesia: no block performed    BP: (!) 175/77 (04/26/24 0900) Heart Rate: 74 (04/26/24 0900) Resp: 21 (04/26/24 0900) Temp: 97.8 F (36.6 C) (04/26/24 0900) SpO2: 100 % (04/26/24 0900)         Notable Events:    No Anesthesia notable events documented.  "

## 2024-04-26 NOTE — Anesthesia Preprocedure Evaluation (Signed)
" °  Anesthesia ROS/Med History   Anesthesia History Patient Has Had Previous Anesthesia Neuro/Psych (+) Neuromuscular Disease (+) Psychiatric History (-) Seizures (-) CVA (-) TIA Pulmonary  (-) Asthma (-) COPD (-) Obstructive Sleep Apnea (OSA) Cardiovascular  (+) Hypertension (HTN) (-) Coronary Artery Disease (CAD) (-) Valvular Pathology (-) Arrhythmia GI/Hepatic  (-) Gastroesophageal Reflux Disease (GERD) Renal/Urologic (-) Chronic Kidney Disease (CKD) Hematology  (-) Coagulopathy (-) Anticoagulant/Antiplatelet Use Endocrine/Cancer/Other  (+) Diabetes Mellitus        Physical Exam  Airway Mallampati: III TM distance: <3 FB Neck ROM: full     Cardiovascular - normal exam  Dental  (+) edentulous   Pulmonary - normal exam  Abdominal - normal exam  Neuro/Psych         Anesthesia Plan (NPO confirmed. Plan for IVGA. Standard ASA monitors. Risks and benefits discussed and all questions answered.  )Anesthetic plan and risks discussed with patient and interpreter (name in comment). ASA 3   general   intravenous induction Anesthesia Special needs:  General IV       Date of last liquid: 04/25/24 (04/26/24 9247)  Time of last liquid: 1900 (04/26/24 0752)  Date of last solid: 04/25/24 (04/26/24 9247)  Time of last solid: 1900 (04/26/24 0752)    BP: (!) 178/87 (04/26/24 0752) Heart Rate: 80 (04/26/24 0752) Resp: 18 (04/26/24 0752) Temp: 98.1 F (36.7 C) (04/26/24 0752) SpO2: 98 % (04/26/24 0752)       "

## 2024-04-27 ENCOUNTER — Inpatient Hospital Stay: Admitting: Medical-Surgical

## 2024-04-27 NOTE — Nursing Note (Signed)
 Called daughter to update on pt but no answer. Left msg to call back at [phone].

## 2024-04-27 NOTE — Nursing Note (Signed)
 Pt's daughter called back and was informed that pt will remain in hospital for Monday procedure at [facility].

## 2024-04-27 NOTE — Progress Notes (Signed)
 ------------------------------------------------------------------------------- Attestation signed by Lonni JONETTA Heather, MD at 04/27/24 1133 Encounter with Emily Hendricks was reviewed.  May be feeling a bit better, but the next most important piece of information is the CT-guided liver biopsy.  Probably would be best for just to stay in the hospital since the plan is already been set up for Monday.  Maybe she will be able to eat a little bit better.  We will check back on Monday. -------------------------------------------------------------------------------  [facility]  Progress Note  Assessment/Plan  Active Hospital Problems   Severe malnutrition (*)    *Starvation ketoacidosis    AKI (acute kidney injury)    Liver masses    Type 2 diabetes mellitus with hypoglycemia, without long-term current use of insulin (*)    Dyslipidemia    Anxiety and depression    Essential hypertension  69 year old woman who presents with a few months of upper GI symptoms that have progressed in the past month with more pain, nausea, very poor appetite and weight loss. She received 4 days of treatment for H. pylori but with worsening symptoms presented to the ER. A CT scan shows multiple liver lesions. She underwent EGD 04/26/2024 with minor inflammation int he antrum and prepyloric area post biopsy, otherwise unremarkable, awaiting biopsies. Started Remeron 15mg  nightly. CT liver biopsy ordered.   Plan: - Continue Remeron 15 mg nightly - Follow results of biopsies from endoscopy - Continue with plan for CT liver biopsy currently scheduled for 2/9 - GI will follow  Interval History  Nausea   Patient seen and examined laying in bed.  She denies abdominal pain.  She did have some nausea and vomiting last night but is feeling better this morning.  Medications:   bismuth  subsalicylate  30 mL Oral QID   heparin  5,000 Units Subcutaneous Q8H 09-02-20   losartan  potassium   50 mg Oral Daily   metroNIDAZOLE   500 mg Oral 4x daily   mirtazapine  15 mg Oral HS   mupirocin   Intranasal BID   NaCl  10 mL Intracatheter Q12H SCH   pantoprazole  sodium  40 mg Oral BID   sertraline   100 mg Oral Daily   tetracycline   500 mg Oral QID   topiramate   25 mg Oral Daily   PRN medications: acetaminophen  Oral **OR** acetaminophen  Rectal,dextrose  Oral **OR** dextrose  IntraVENous **OR** dextrose  IntraVENous **OR** glucagon Intramuscular,influenza virus vaccine injection Intramuscular,NaCl IntraVENous,NaCl Intracatheter **AND** NaCl Intracatheter,ondansetron  IntraVENous,polyethylene glycol Oral  Physical Examination  Temp:  [97.5 F (36.4 C)-98.7 F (37.1 C)] 98.4 F (36.9 C) Heart Rate:  [67-82] 75 Resp:  [15-21] 18 BP: (144-180)/(75-94) 148/81 SpO2:  [99 %-100 %] 100 % Pain Score: 0-No pain Adult Faces: 0 O2 Device: None (Room air) O2 Flow Rate (L/min): 0 L/min No data recorded   Intakes & Outputs (Last 24 hours) at 04/27/2024 0700 Last data filed at 04/26/2024 2010 24hr Volume @0700   Intake 1993.28 mL  Output 1000 mL  Net 993.28 mL    Physical Exam Vitals reviewed.  Constitutional:      General: She is not in acute distress.    Appearance: Normal appearance. She is not ill-appearing.  HENT:     Head: Normocephalic and atraumatic.  Eyes:     General: No scleral icterus.    Conjunctiva/sclera: Conjunctivae normal.  Pulmonary:     Effort: Pulmonary effort is normal.  Abdominal:     General: Abdomen is flat. There is no distension.     Palpations: Abdomen is soft.  Tenderness: There is no abdominal tenderness. There is no guarding or rebound.  Neurological:     Mental Status: She is alert and oriented to person, place, and time. Mental status is at baseline.  Psychiatric:        Mood and Affect: Mood normal.        Behavior: Behavior normal.   Results  Labs:  Recent Results (from the past 24 hours)  POCT Glucose   Collection Time: 04/26/24   8:49 AM  Result Value Ref Range   Glucose, POC 88 70 - 99 mg/dL   OPERATOR ID [id number]    INSTRUMENT ID [id number]   POCT Glucose   Collection Time: 04/26/24 11:41 AM  Result Value Ref Range   Glucose, POC 192 (H) 70 - 99 mg/dL   OPERATOR ID [id number]    INSTRUMENT ID [id number]   POCT Glucose   Collection Time: 04/26/24  4:14 PM  Result Value Ref Range   Glucose, POC 133 (H) 70 - 99 mg/dL   OPERATOR ID [id number]    INSTRUMENT ID [id number]   POCT Glucose   Collection Time: 04/26/24  7:59 PM  Result Value Ref Range   Glucose, POC 125 (H) 70 - 99 mg/dL   OPERATOR ID [id number]    INSTRUMENT ID [id number]   CBC And Differential   Collection Time: 04/27/24  1:39 AM  Result Value Ref Range   WBC 2.8 (L) 4.0 - 10.5 10e3cells/uL   RBC 3.32 (L) 3.93 - 5.22 10e6cells/uL   HGB 8.8 (L) 11.2 - 15.7 gm/dL   HCT 71.0 (L) 65.8 - 55.0 %   MCV 87.0 79.4 - 94.8 fL   MCH 26.5 25.6 - 32.2 pg   MCHC 30.4 (L) 32.2 - 35.5 gm/dL   Plt Ct 859 (L) 849 - 400 10e3cells/uL   RDW SD 53.4 (H) 35.1 - 46.3 fL   MPV 9.9 9.4 - 12.4 fL   NRBC% 1.1 (H) 0.0 - 0.2 /100WBC   Absolute NRBC Count 0.03 (H) 0.00 - 0.01 10e3cells/uL   NEUTROPHIL % 52.3 %   LYMPHOCYTE % 38.0 %   MONOCYTE % 6.5 %   Eosinophil % 1.1 %   BASOPHIL % 0.7 %   IG% 1.4 %   Absolute Neutrophil Count 1.44 (L) 1.50 - 7.50 10e3cells/uL   Absolute Lymphocyte Count 1.05 1.00 - 4.50 10e3cells/uL   Absolute Monocyte Count 0.18 0.10 - 0.80 10e3cells/uL   Absolute Eosinophil Count 0.03 0.00 - 0.50 10e3cells/uL   Absolute Basophil Count 0.02 0.00 - 0.20 10e3cells/uL   Absolute Immature Granulocyte Count 0.04 (H) 0.00 - 0.03 10e3cells/uL  POCT Glucose   Collection Time: 04/27/24  8:06 AM  Result Value Ref Range   Glucose, POC 80 70 - 99 mg/dL   OPERATOR ID [id number]    INSTRUMENT ID [id number]    Imaging: No results found.  Coding  Electronically signed: Marry LOISE Fava, PA-C 04/27/2024 / 8:37 AM

## 2024-05-09 ENCOUNTER — Inpatient Hospital Stay: Admitting: Hematology and Oncology

## 2024-05-09 ENCOUNTER — Inpatient Hospital Stay

## 2024-05-10 ENCOUNTER — Ambulatory Visit: Admitting: Medical-Surgical
